# Patient Record
Sex: Male | Born: 1935
Health system: Southern US, Community
[De-identification: ages and names within clinical notes are randomized; demographics above are authoritative.]

## PROBLEM LIST (undated history)

## (undated) DIAGNOSIS — D369 Benign neoplasm, unspecified site: Secondary | ICD-10-CM

## (undated) DIAGNOSIS — Z860100 Personal history of colon polyps, unspecified: Secondary | ICD-10-CM

## (undated) DIAGNOSIS — I2510 Atherosclerotic heart disease of native coronary artery without angina pectoris: Secondary | ICD-10-CM

## (undated) DIAGNOSIS — I350 Nonrheumatic aortic (valve) stenosis: Secondary | ICD-10-CM

## (undated) DIAGNOSIS — Z8546 Personal history of malignant neoplasm of prostate: Secondary | ICD-10-CM

## (undated) DIAGNOSIS — M48061 Spinal stenosis, lumbar region without neurogenic claudication: Secondary | ICD-10-CM

## (undated) DIAGNOSIS — I10 Essential (primary) hypertension: Secondary | ICD-10-CM

## (undated) DIAGNOSIS — E785 Hyperlipidemia, unspecified: Secondary | ICD-10-CM

## (undated) DIAGNOSIS — K649 Unspecified hemorrhoids: Secondary | ICD-10-CM

## (undated) DIAGNOSIS — C4490 Unspecified malignant neoplasm of skin, unspecified: Secondary | ICD-10-CM

## (undated) DIAGNOSIS — K5790 Diverticulosis of intestine, part unspecified, without perforation or abscess without bleeding: Secondary | ICD-10-CM

## (undated) HISTORY — DX: Personal history of malignant neoplasm of prostate: Z85.46

## (undated) HISTORY — PX: APPENDECTOMY: SHX54

## (undated) HISTORY — DX: Benign neoplasm, unspecified site: D36.9

## (undated) HISTORY — DX: Personal history of colonic polyps: Z86.010

## (undated) HISTORY — DX: Essential (primary) hypertension: I10

## (undated) HISTORY — DX: Unspecified malignant neoplasm of skin, unspecified: C44.90

## (undated) HISTORY — DX: Unspecified hemorrhoids: K64.9

## (undated) HISTORY — PX: CARDIAC CATHETERIZATION: SHX172

## (undated) HISTORY — PX: MOHS SURGERY: SUR867

## (undated) HISTORY — DX: Nonrheumatic aortic (valve) stenosis: I35.0

## (undated) HISTORY — PX: CORONARY STENT PLACEMENT: SHX1402

## (undated) HISTORY — PX: PROSTATECTOMY: SHX69

## (undated) HISTORY — DX: Diverticulosis of intestine, part unspecified, without perforation or abscess without bleeding: K57.90

## (undated) HISTORY — DX: Spinal stenosis, lumbar region without neurogenic claudication: M48.061

## (undated) HISTORY — DX: Atherosclerotic heart disease of native coronary artery without angina pectoris: I25.10

## (undated) HISTORY — DX: Hyperlipidemia, unspecified: E78.5

## (undated) HISTORY — DX: Personal history of colon polyps, unspecified: Z86.0100

---

## 2000-11-02 ENCOUNTER — Encounter (INDEPENDENT_AMBULATORY_CARE_PROVIDER_SITE_OTHER): Payer: Self-pay | Admitting: Specialist

## 2000-11-02 ENCOUNTER — Other Ambulatory Visit: Admission: RE | Admit: 2000-11-02 | Discharge: 2000-11-02 | Payer: Self-pay | Admitting: Urology

## 2000-11-09 ENCOUNTER — Encounter: Admission: RE | Admit: 2000-11-09 | Discharge: 2001-02-07 | Payer: Self-pay | Admitting: Radiation Oncology

## 2000-12-15 ENCOUNTER — Encounter (INDEPENDENT_AMBULATORY_CARE_PROVIDER_SITE_OTHER): Payer: Self-pay | Admitting: Specialist

## 2000-12-15 ENCOUNTER — Inpatient Hospital Stay (HOSPITAL_COMMUNITY): Admission: RE | Admit: 2000-12-15 | Discharge: 2000-12-21 | Payer: Self-pay | Admitting: Urology

## 2000-12-17 ENCOUNTER — Encounter: Payer: Self-pay | Admitting: Urology

## 2000-12-17 ENCOUNTER — Encounter (INDEPENDENT_AMBULATORY_CARE_PROVIDER_SITE_OTHER): Payer: Self-pay | Admitting: *Deleted

## 2000-12-19 ENCOUNTER — Encounter: Payer: Self-pay | Admitting: Urology

## 2000-12-19 ENCOUNTER — Encounter (INDEPENDENT_AMBULATORY_CARE_PROVIDER_SITE_OTHER): Payer: Self-pay | Admitting: *Deleted

## 2005-06-23 ENCOUNTER — Ambulatory Visit: Payer: Self-pay

## 2005-07-03 ENCOUNTER — Ambulatory Visit: Payer: Self-pay | Admitting: Cardiology

## 2005-07-06 ENCOUNTER — Inpatient Hospital Stay (HOSPITAL_COMMUNITY): Admission: AD | Admit: 2005-07-06 | Discharge: 2005-07-07 | Payer: Self-pay | Admitting: Cardiology

## 2005-07-06 ENCOUNTER — Inpatient Hospital Stay (HOSPITAL_BASED_OUTPATIENT_CLINIC_OR_DEPARTMENT_OTHER): Admission: RE | Admit: 2005-07-06 | Discharge: 2005-07-06 | Payer: Self-pay | Admitting: Cardiology

## 2005-07-06 ENCOUNTER — Ambulatory Visit: Payer: Self-pay | Admitting: Cardiology

## 2005-07-14 ENCOUNTER — Ambulatory Visit: Payer: Self-pay | Admitting: Cardiology

## 2005-07-28 ENCOUNTER — Ambulatory Visit: Payer: Self-pay | Admitting: Cardiology

## 2005-08-03 ENCOUNTER — Ambulatory Visit: Payer: Self-pay

## 2005-08-03 ENCOUNTER — Ambulatory Visit: Payer: Self-pay | Admitting: Cardiology

## 2005-11-12 ENCOUNTER — Ambulatory Visit: Payer: Self-pay | Admitting: Cardiology

## 2005-11-17 ENCOUNTER — Ambulatory Visit: Payer: Self-pay | Admitting: Cardiology

## 2006-02-16 ENCOUNTER — Ambulatory Visit: Payer: Self-pay

## 2006-03-29 ENCOUNTER — Ambulatory Visit: Payer: Self-pay | Admitting: Cardiology

## 2007-04-07 ENCOUNTER — Ambulatory Visit: Payer: Self-pay | Admitting: Cardiology

## 2008-04-24 ENCOUNTER — Ambulatory Visit: Payer: Self-pay | Admitting: Cardiology

## 2009-05-19 DIAGNOSIS — E785 Hyperlipidemia, unspecified: Secondary | ICD-10-CM | POA: Insufficient documentation

## 2009-05-19 DIAGNOSIS — I2510 Atherosclerotic heart disease of native coronary artery without angina pectoris: Secondary | ICD-10-CM | POA: Insufficient documentation

## 2009-05-19 DIAGNOSIS — I10 Essential (primary) hypertension: Secondary | ICD-10-CM | POA: Insufficient documentation

## 2009-05-20 ENCOUNTER — Ambulatory Visit: Payer: Self-pay | Admitting: Cardiology

## 2009-05-20 ENCOUNTER — Telehealth: Payer: Self-pay | Admitting: Cardiology

## 2009-07-16 ENCOUNTER — Telehealth: Payer: Self-pay | Admitting: Cardiology

## 2009-12-05 ENCOUNTER — Encounter (INDEPENDENT_AMBULATORY_CARE_PROVIDER_SITE_OTHER): Payer: Self-pay | Admitting: *Deleted

## 2010-08-05 ENCOUNTER — Encounter: Payer: Self-pay | Admitting: Internal Medicine

## 2010-08-12 ENCOUNTER — Encounter: Payer: Self-pay | Admitting: Internal Medicine

## 2010-08-20 ENCOUNTER — Encounter: Payer: Self-pay | Admitting: Internal Medicine

## 2010-08-21 ENCOUNTER — Telehealth: Payer: Self-pay | Admitting: Cardiology

## 2010-08-22 ENCOUNTER — Encounter: Payer: Self-pay | Admitting: Internal Medicine

## 2010-09-11 ENCOUNTER — Encounter: Payer: Self-pay | Admitting: Cardiovascular Disease

## 2010-09-11 ENCOUNTER — Ambulatory Visit
Admission: RE | Admit: 2010-09-11 | Discharge: 2010-09-11 | Payer: Self-pay | Source: Home / Self Care | Attending: Cardiovascular Disease | Admitting: Cardiovascular Disease

## 2010-09-23 NOTE — Letter (Signed)
Summary: Referral - not able to see patient  [facility]  [address] [place], [place] [address]   Phone: [phone]  Fax: [phone]        December 05, 2009    [facility] A. Jacky Kindle, M.D. [address] [place], [place] [address]    Re:   [name] [name] DOB:  03-09-1932 MRN:   [id number]    Dear Dr. Jacky Kindle:  Thank you for your kind referral of the above patient.  We have attempted to schedule the recommended procedure Screening Colonoscopy but have not been able to schedule because:   X  The patient was not available by phone and/or has not returned our calls.  ___ The patient declined to schedule the procedure at this time.  We appreciate the referral and hope that we will have the opportunity to treat this patient in the future.    Sincerely,    [facility] [phone]

## 2010-09-25 NOTE — Assessment & Plan Note (Signed)
Summary: pt of brodie/mt   Primary Provider:  DR Donney Dice  CC:  check up.  History of Present Illness: Mr. Cartlidge is 77 years old and returns for management of CAD.  10 2006 he had a bare-metal stent to the RCA and had residual 50-70% narrowing in the LAD. He has good LV function.  He has done quite well over the past year with no chest pain joints breath or palpitations. He works at the [employer] and is outside in hot weather much of the time and has had no symptoms related to this. He walks 2 miles 3 days a week.  I recently cared for his wife who had and MI after knee surgery requiring extensive hospital stay and rehab.  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 1 Tab By Mouth Once Daily 2)  Metoprolol Succinate 25 Mg Xr24h-Tab (Metoprolol Succinate) .... Take One Tablet By Mouth Daily 3)  Aspirin 81 Mg Tbec (Aspirin) .... Take One Tablet By Mouth Daily 4)  Nitrostat 0.4 Mg Subl (Nitroglycerin) .... As Directed 5)  Lotrel 5-20 Mg Caps (Amlodipine Besy-Benazepril Hcl) .Marland Kitchen.. 1 Tab By Mouth Once Daily 6)  Triamterene-Hctz 75-50 Mg Tabs (Triamterene-Hctz) .Marland Kitchen.. 1 Tab By Mouth Once Daily 7)  Omega 3 .Marland Kitchen.. 1 Tab Once Daily 8)  Plavix 75 Mg Tabs (Clopidogrel Bisulfate) .... Take One Tablet By Mouth Daily  Allergies (verified): No Known Drug Allergies  Past History:  Past Medical History: Last updated: 05/19/2009 1. Coronary artery disease status post stenting of the right coronary     artery in November 2006 with a bare-metal stent with residual 50-     70% narrowing in the left anterior descending coronary artery. 2. Good left ventricular function. 3. Hypertension. 4. Hyperlipidemia. 5. History of prostatectomy.    Family History: Last updated: 09/11/2010 no premature CAD  Social History: Last updated: 09/11/2010 Worked at [employer] Non smoker Non drinker Active works out 4-5 x/week  Family History: no premature CAD  Social History: Worked at [employer] Non smoker Non drinker Active works out 4-5 x/week  Review of Systems       Denies fever, malais, weight loss, blurry vision, decreased visual acuity, cough, sputum, SOB, hemoptysis, pleuritic pain, palpitaitons, heartburn, abdominal pain, melena, lower extremity edema, claudication, or rash.   Vital Signs:  Patient profile:   77 year old male Height:      70 inches Weight:      208 pounds BMI:     29.95 Pulse rate:   59 / minute Resp:     14 per minute BP sitting:   145 / 74  (left arm)  Vitals Entered By: Kem Parkinson (September 11, 2010 3:48 PM)  Physical Exam  General:  Affect appropriate Healthy:  appears stated age HEENT: normal Neck supple with no adenopathy JVP normal no bruits no thyromegaly Lungs clear with no wheezing and good diaphragmatic motion Heart:  S1/S2 no murmur,rub, gallop or click PMI normal Abdomen: benighn, BS positve, no tenderness, no AAA no bruit.  No HSM or HJR Distal pulses intact with no bruits No edema Neuro non-focal Skin warm and dry    Impression & Recommendations:  Problem # 1:  HYPERLIPIDEMIA-MIXED (ICD-272.4) Continue vytorin labs per primary His  updated medication list for this problem includes:    Vytorin 10-20 Mg Tabs (Ezetimibe-simvastatin) .Marland Kitchen... 1 tab by mouth once daily  Problem # 2:  HYPERTENSION, BENIGN (ICD-401.1) Well controlled His updated medication list for this problem includes:    Metoprolol Succinate 25 Mg Xr24h-tab (Metoprolol succinate) .Marland Kitchen... Take one tablet by mouth daily    Aspirin 81 Mg Tbec (Aspirin) .Marland Kitchen... Take one tablet by mouth daily    Lotrel 5-20 Mg Caps (Amlodipine besy-benazepril hcl) .Marland Kitchen... 1 tab by mouth once daily     Triamterene-hctz 75-50 Mg Tabs (Triamterene-hctz) .Marland Kitchen... 1 tab by mouth once daily  Problem # 3:  CAD, NATIVE VESSEL (ICD-414.01) Stable no angina Would continue Plavix since there is no down side to doing this His updated medication list for this problem includes:    Metoprolol Succinate 25 Mg Xr24h-tab (Metoprolol succinate) .Marland Kitchen... Take one tablet by mouth daily    Aspirin 81 Mg Tbec (Aspirin) .Marland Kitchen... Take one tablet by mouth daily    Nitrostat 0.4 Mg Subl (Nitroglycerin) .Marland Kitchen... As directed    Lotrel 5-20 Mg Caps (Amlodipine besy-benazepril hcl) .Marland Kitchen... 1 tab by mouth once daily    Plavix 75 Mg Tabs (Clopidogrel bisulfate) .Marland Kitchen... Take one tablet by mouth daily  Patient Instructions: 1)  Your physician wants you to follow-up in: ONE YEAR  You will receive a reminder letter in the mail two months in advance. If you don't receive a letter, please call our office to schedule the follow-up appointment.

## 2010-09-25 NOTE — Progress Notes (Signed)
Summary: question on his new dr   Phone Note Call from Patient Call back at Home Phone [phone]   Caller: Patient Reason for Call: Talk to Nurse Summary of Call: pt wants to know what dr does dr Juanda Chance wants him to see Initial call taken by: Roe Coombs,  August 21, 2010 3:47 PM  Follow-up for Phone Call        Flag sent to Dr. Juanda Chance to see who he would recommend. Sherri Rad, RN, BSN  August 21, 2010 4:19 PM   Per Dr. Juanda Chance, the pt should f/u with Dr. Clifton James. I will notify the pt. Sherri Rad, RN, BSN  August 26, 2010 9:21 AM   The pt is aware of Dr. Regino Schultze recommendation. He did not want to schedule a f/u appt at this time, but stated he would call back. Follow-up by: Sherri Rad, RN, BSN,  August 26, 2010 9:23 AM

## 2010-09-25 NOTE — Letter (Signed)
Summary: New Patient letter  [facility]  [address] [place], [place] [address]   Phone: [phone]  Fax: [phone]    ;   08/22/2010 MRN: [id number]  [name] [name] [address] [place], [place]  [address]  Dear Mr. TRAWEEK,  Welcome to the Gastroenterology Division at [facility].    You are scheduled to see Dr.  Lina Sar on 10-15-10 at  9:15am on the 3rd floor at [facility], [address]  We ask that you try to arrive at our office 15 minutes prior to your appointment time to allow for check-in.  We would like you to complete the enclosed self-administered evaluation form prior to your visit and bring it with you on the day of your appointment.  We will review it with you.  Also, please bring a complete list of all your medications or, if you prefer, bring the medication bottles and we will list them.  Please bring your insurance card so that we may make a copy of it.  If your insurance requires a referral to see a specialist, please bring your referral form from your primary care physician.  Co-payments are due at the time of your visit and may be paid by cash, check or credit card.     Your office visit will consist of a consult with your physician (includes a physical exam), any laboratory testing he/she may order, scheduling of any necessary diagnostic testing (e.g. x-ray, ultrasound, CT-scan), and scheduling of a procedure (e.g. Endoscopy, Colonoscopy) if required.  Please allow enough time on your schedule to allow for any/all of these possibilities.    If you cannot keep your appointment, please call [phone] to cancel or reschedule prior to your appointment date.  This allows [place] the opportunity to schedule an appointment for another patient in need of care.  If you do not cancel or reschedule by 5 p.m. the business day prior to your appointment date, you will be charged a $50.00 late cancellation/no-show fee.    Thank you for choosing  [facility] for your medical needs.  We appreciate the opportunity to care for you.  Please visit [place] at our website  to learn more about our practice.                     Sincerely,                                                             The Gastroenterology Division

## 2010-10-16 ENCOUNTER — Telehealth: Payer: Self-pay | Admitting: Cardiovascular Disease

## 2010-10-16 DIAGNOSIS — Z8546 Personal history of malignant neoplasm of prostate: Secondary | ICD-10-CM | POA: Insufficient documentation

## 2010-10-16 DIAGNOSIS — K921 Melena: Secondary | ICD-10-CM | POA: Insufficient documentation

## 2010-10-20 ENCOUNTER — Encounter: Payer: Self-pay | Admitting: Internal Medicine

## 2010-10-20 ENCOUNTER — Ambulatory Visit (INDEPENDENT_AMBULATORY_CARE_PROVIDER_SITE_OTHER): Payer: Medicare Other | Admitting: Internal Medicine

## 2010-10-20 DIAGNOSIS — Z1211 Encounter for screening for malignant neoplasm of colon: Secondary | ICD-10-CM

## 2010-10-20 DIAGNOSIS — R195 Other fecal abnormalities: Secondary | ICD-10-CM

## 2010-10-21 NOTE — Letter (Signed)
Summary: [facility] Office Note  [facility] Office Note   Imported By: Lamona Curl CMA (AAMA) 10/16/2010 14:05:36  _____________________________________________________________________  External Attachment:    Type:   Image     Comment:   External Document

## 2010-10-21 NOTE — Letter (Signed)
Summary: [facility]   Imported By: Lamona Curl CMA (AAMA) 10/16/2010 14:14:54  _____________________________________________________________________  External Attachment:    Type:   Image     Comment:   External Document

## 2010-10-21 NOTE — Letter (Signed)
Summary: [facility]  [facility]   Imported By: Lamona Curl CMA (AAMA) 10/16/2010 14:06:55  _____________________________________________________________________  External Attachment:    Type:   Image     Comment:   External Document

## 2010-10-21 NOTE — Letter (Signed)
Summary: [facility]  [facility]   Imported By: Lamona Curl CMA (AAMA) 10/16/2010 14:15:23  _____________________________________________________________________  External Attachment:    Type:   Image     Comment:   External Document

## 2010-10-23 ENCOUNTER — Other Ambulatory Visit: Payer: Self-pay | Admitting: Internal Medicine

## 2010-10-23 ENCOUNTER — Other Ambulatory Visit (AMBULATORY_SURGERY_CENTER): Payer: Medicare Other | Admitting: Internal Medicine

## 2010-10-23 DIAGNOSIS — D126 Benign neoplasm of colon, unspecified: Secondary | ICD-10-CM

## 2010-10-23 DIAGNOSIS — K5730 Diverticulosis of large intestine without perforation or abscess without bleeding: Secondary | ICD-10-CM

## 2010-10-23 DIAGNOSIS — Z1211 Encounter for screening for malignant neoplasm of colon: Secondary | ICD-10-CM

## 2010-10-23 DIAGNOSIS — K625 Hemorrhage of anus and rectum: Secondary | ICD-10-CM

## 2010-10-24 ENCOUNTER — Telehealth: Payer: Self-pay | Admitting: Internal Medicine

## 2010-10-28 ENCOUNTER — Encounter: Payer: Self-pay | Admitting: Internal Medicine

## 2010-10-30 NOTE — Assessment & Plan Note (Signed)
Summary: SCREEN FOR COLON ON PLAVIX//BLOOD IN STOOL/YF   NO GI HX PER ...   History of Present Illness Visit Type: Initial Consult Primary GI MD: Lina Sar MD Primary Provider: Minda Meo, MD  Requesting Provider: Minda Meo, MD  Chief Complaint: Consult colon. Pt c/o hemorrhoids and some rectal bleeding  History of Present Illness:   This is a 77 year old white male who is here to discuss having a colonoscopy. He has never had a colonoscopy before although his record mentions colon in 2004.  He is on Plavix 75 mg daily for coronary artery disease. He is status post bare-metal stent placement in RCA and has good left ventricular function. Patient has been followed formely by Dr Charlies Constable and currently by Dr Eden Emms. He was found to have Hemoccult-positive stool on a home test. He has regular bowel habits. He denies having any visible blood per rectum but has complained of occasional hemorrhoidal problems. 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   GI Review of Systems      Denies abdominal pain, acid reflux, belching, bloating, chest pain, dysphagia with liquids, dysphagia with solids, heartburn, loss of appetite, nausea, vomiting, vomiting blood, weight loss, and  weight gain.      Reports hemorrhoids and  rectal bleeding.     Denies anal fissure, black tarry stools, change in bowel habit, constipation, diarrhea, diverticulosis, fecal incontinence, heme positive stool, irritable bowel syndrome, jaundice, light color stool, liver problems, and  rectal pain.    Current Medications (verified): 1)  Vytorin 10-20 Mg Tabs (Ezetimibe-Simvastatin) .Marland Kitchen.. 1 Tab By Mouth Once Daily 2)  Metoprolol Succinate 25 Mg Xr24h-Tab (Metoprolol Succinate) .... Take One Tablet By Mouth Daily 3)  Aspirin 81 Mg Tbec (Aspirin) .... Take One Tablet By Mouth Daily 4)  Nitrostat 0.4 Mg Subl (Nitroglycerin) .... As Directed 5)  Lotrel 5-20 Mg Caps (Amlodipine Besy-Benazepril Hcl) .Marland Kitchen.. 1 Tab By Mouth Once Daily 6)   Triamterene-Hctz 75-50 Mg Tabs (Triamterene-Hctz) .Marland Kitchen.. 1 Tab By Mouth Once Daily 7)  Omega 3 .Marland Kitchen.. 1 Tab Once Daily 8)  Plavix 75 Mg Tabs (Clopidogrel Bisulfate) .... Take One Tablet By Mouth Daily  Allergies (verified): No Known Drug Allergies  Past History:  Past Medical History: 1. Coronary artery disease status post stenting of the right coronary     artery in November 2006 with a bare-metal stent with residual 50-     70% narrowing in the left anterior descending coronary artery. 2. Good left ventricular function. 3. Hypertension. 4. Hyperlipidemia. 5. History of prostatectomy.  Hemorrhoids  Past Surgical History: Prostatectomy Appendectomy Stent Surgery   Family History: Reviewed history from 10/16/2010 and no changes required. no premature CAD Family History of Heart Disease: Mother No FH of Colon Cancer:  Social History: Worked at [employer]--Retired Married Childern Non smoker Non drinker Active works out 4-5 x/week  Review of Systems       The patient complains of urine leakage.  The patient denies allergy/sinus, anemia, anxiety-new, arthritis/joint pain, back pain, blood in urine, breast changes/lumps, change in vision, confusion, cough, coughing up blood, depression-new, fainting, fatigue, fever, headaches-new, hearing problems, heart murmur, heart rhythm changes, itching, menstrual pain, muscle pains/cramps, night sweats, nosebleeds, pregnancy symptoms, shortness of breath, skin rash, sleeping problems, sore throat, swelling of feet/legs, swollen lymph glands, thirst - excessive , urination - excessive , urination changes/pain, vision changes, and voice change.         Pertinent positive and negative review of systems were noted in the  above HPI. All other ROS was otherwise negative.   Vital Signs:  Patient profile:   77 year old male Height:      70 inches Weight:      209 pounds BMI:     30.10 BSA:     2.13 Pulse rate:   60 / minute Pulse rhythm:    regular BP sitting:   124 / 68  (left arm) Cuff size:   regular  Vitals Entered By: Ok Anis CMA (October 20, 2010 9:06 AM)  Physical Exam  General:  Well developed, well nourished, no acute distress. Eyes:  PERRLA, no icterus. Mouth:  No deformity or lesions, dentition normal. Neck:  Supple; no masses or thyromegaly. Lungs:  Clear throughout to auscultation. Heart:  Regular rate and rhythm; no murmurs, rubs,  or bruits. Abdomen:  Soft, nontender and nondistended. No masses, hepatosplenomegaly or hernias noted. Normal bowel sounds,well-healed surgical scar from prior prostatectomy. Rectal:  normal perianal area. Normal rectal sphincter tone. Hemoccult-Positive yellow stool. Extremities:  No clubbing, cyanosis, edema or deformities noted.   Impression & Recommendations:  Problem # 1:  BLOOD IN STOOL (ICD-578.1)  Patient had heme positive stool on a home screening test as well as on my exam today. He is an appropriate candidate for screening colonoscopy which will be scheduled at his earliest convenience. His hemoglobin is 13.9.  Orders: Colonoscopy (Colon)  Problem # 2:  CAD, NATIVE VESSEL (ICD-414.01) Patient has been on aspirin and Plavix. We will continue her on both medications during colonoscopy to minimize the risk of stent occlusion and the patient agrees with the plan.  Patient Instructions: 1)  Remain on Plavix and aspirin for colonoscopy 2)  We have scheduled you for a colonoscopy with Moviprep. 3)  Further recommendation depending on the results of the colonoscopy. 4)  Copy sent to : Dr Jacky Kindle 5)  The medication list was reviewed and reconciled.  All changed / newly prescribed medications were explained.  A complete medication list was provided to the patient / caregiver. Prescriptions: MOVIPREP 100 GM  SOLR (PEG-KCL-NACL-NASULF-NA ASC-C) As per prep instructions.  #1 x 0   Entered by:   Lamona Curl CMA (AAMA)   Authorized by:   Hart Carwin MD    Signed by:   Lamona Curl CMA (AAMA) on 10/20/2010   Method used:   Electronically to        [facility]* (retail)       [address]       [place], [place]  [id number]       Ph: [phone]       Fax: [phone]   RxID:   [id number]

## 2010-10-30 NOTE — Progress Notes (Signed)
Summary: Triage  Phone Note Call from Patient Call back at Home Phone [phone]   Caller: Patient Call For: Dr. Juanda Chance Reason for Call: Talk to Nurse Summary of Call: Wants to know what his BP was yesterday Initial call taken by: Karna Christmas,  October 24, 2010 3:27 PM  Follow-up for Phone Call        request reading of several blood pressure while here for colonoscopy.  States he likes to keep a record.  Gave hime several of his blood pressure reading and he seemed satisfied. Follow-up by: Laverna Peace RN,  October 24, 2010 4:31 PM

## 2010-10-30 NOTE — Procedures (Addendum)
Summary: Colonoscopy  Patient: [name] Note: All result statuses are Final unless otherwise noted.  Tests: (1) Colonoscopy (COL)   COL Colonoscopy           DONE     [facility]     [address]     [place], [place]  [address]          COLONOSCOPY PROCEDURE REPORT          PATIENT:  [name] [name], [name] [name]  MR#:  [id number]     BIRTHDATE:  [birth year], 78 yrs. old  GENDER:  male     ENDOSCOPIST:  Hedwig Morton. Juanda Chance, MD     REF. BY:  Geoffry Paradise, M.D.     PROCEDURE DATE:  10/23/2010     PROCEDURE:  Colonoscopy 21308     ASA CLASS:  Class III     INDICATIONS:  Routine Risk Screening     MEDICATIONS:   Versed 9 mg, Fentanyl 100 mcg          DESCRIPTION OF PROCEDURE:   After the risks benefits and     alternatives of the procedure were thoroughly explained, informed     consent was obtained.  Digital rectal exam was performed and     revealed no rectal masses.   The LB CF-[id number] endoscope     was introduced through the anus and advanced to the cecum, which     was identified by both the appendix and ileocecal valve, without     limitations.  The quality of the prep was good, using MoviPrep.     The instrument was then slowly withdrawn as the colon was fully     examined.     <<PROCEDUREIMAGES>>          FINDINGS:  There were multiple polyps identified and removed. 6     polyps 25 - 90 cm, 15 mm and 12mm 2 large pedunculated polyps at     30cm, and 4 small sessile polyps 4-5 mm in the descending colon     The polyps were removed using cold biopsy forceps. Polyps were     snared without cautery. Retrieval was successful (see image4,     image5, image6, image7, image8, image10, image11, image12,     image13, image14, and image15). snare polyp  Moderate     diverticulosis was found in the sigmoid colon (see image9).     difficult to advance through narrow tortuous sigmoid colon  This     was otherwise a normal examination of the colon (see image3).     Retroflexed  views in the rectum revealed it was not tolerated by the     patient.    The scope was then withdrawn from the patient and the     procedure completed.          COMPLICATIONS:  None     ENDOSCOPIC IMPRESSION:     1) Polyps, multiple     2) Moderate diverticulosis in the sigmoid colon     3) Otherwise normal examination     4) It was not tolerated by the patient.     large pedunculated polyp injected with Epi prior to the removal          6 polyps removed     RECOMMENDATIONS:     1) Await pathology results     hold Plavix x 1 week, then resume     future colon ought to be done off Plavix  REPEAT EXAM:  In 3 year(s) for.          ______________________________     Hedwig Morton. Juanda Chance, MD          CC:          n.     eSIGNED:   Hedwig Morton. Juana Montini at 10/23/2010 02:29 PM          Lana Fish, [id number]  Note: An exclamation mark (!) indicates a result that was not dispersed into the flowsheet. Document Creation Date: 10/23/2010 2:29 PM _______________________________________________________________________  (1) Order result status: Final Collection or observation date-time: 10/23/2010 14:13 Requested date-time:  Receipt date-time:  Reported date-time:  Referring Physician:   Ordering Physician: Lina Sar [id number]) Specimen Source:  Source: Launa Grill Order Number: [id number] Lab site:   Appended Document: Colonoscopy     Procedures Next Due Date:    Colonoscopy: 10/2012

## 2010-10-30 NOTE — Progress Notes (Signed)
Summary: question re procedure  Phone Note Call from Patient Call back at Home Phone [phone]   Caller: Patient Reason for Call: Talk to Nurse Summary of Call: pt having a colon procedure w. dr Juanda Chance, dora on monday. pt wants to know if its okay for him to have it done.  Initial call taken by: Roe Coombs,  October 16, 2010 4:50 PM  Follow-up for Phone Call        spoke with pt, he is having colonoscopy monday and wondered if okay because of plavix. will discuss with dr Verdis Prime, RN  October 16, 2010 5:45 PM  left message for pt okay for colonoscopy on plavix Deliah Goody, RN  October 20, 2010 11:19 AM

## 2010-10-30 NOTE — Letter (Signed)
Summary: [facility] Instructions  [facility]  [address] [place], [place] [address]   Phone: [phone]  Fax: [phone]       [name] [name]    [birth year]    MRN: [id number]        Procedure Day /Date: Thursday 10/23/10     Arrival Time: 12:30 pm     Procedure Time: 1:30 pm        Location of Procedure:                    _x _  [facility] (4th Floor)  PREPARATION FOR COLONOSCOPY WITH MOVIPREP   Starting 5 days prior to your procedure (TODAY) do not eat nuts, seeds, popcorn, corn, beans, peas,  salads, or any raw vegetables.  Do not take any fiber supplements (e.g. Metamucil, Citrucel, and Benefiber).  THE DAY BEFORE YOUR PROCEDURE         DATE: 10/22/10  DAY: Wednesday  1.  Drink clear liquids the entire day-NO SOLID FOOD  2.  Do not drink anything colored red or purple.  Avoid juices with pulp.   No orange juice.  3.  Drink at least 64 oz. (8 glasses) of fluid/clear liquids during the day to prevent dehydration and help the prep work efficiently.  CLEAR LIQUIDS INCLUDE: Water Jello Ice Popsicles Tea (sugar ok, no milk/cream) Powdered fruit flavored drinks Coffee (sugar ok, no milk/cream) Gatorade Juice: apple, white grape, white cranberry  Lemonade Clear bullion, consomm, broth Carbonated beverages (any kind) Strained chicken noodle soup Hard Candy                             4.  In the morning, mix first dose of MoviPrep solution:    Empty 1 Pouch A and 1 Pouch B into the disposable container    Add lukewarm drinking water to the top line of the container. Mix to dissolve    Refrigerate (mixed solution should be used within 24 hrs)  5.  Begin drinking the prep at 5:00 p.m. The MoviPrep container is divided by 4 marks.   Every 15 minutes drink the solution down to the next mark (approximately 8 oz) until the full liter is complete.   6.  Follow completed prep with 16 oz of clear liquid of your choice (Nothing red or purple).   Continue to drink clear liquids until bedtime.  7.  Before going to bed, mix second dose of MoviPrep solution:    Empty 1 Pouch A and 1 Pouch B into the disposable container    Add lukewarm drinking water to the top line of the container. Mix to dissolve    Refrigerate  THE DAY OF YOUR PROCEDURE      DATE: 10/23/10 DAY: Thursday  Beginning at 8:30 a.m. (5 hours before procedure):         1. Every 15 minutes, drink the solution down to the next mark (approx 8 oz) until the full liter is complete.  2. Follow completed prep with 16 oz. of clear liquid of your choice.    3. You may drink clear liquids until 11:30 am (2 HOURS BEFORE PROCEDURE).   MEDICATION INSTRUCTIONS  Unless otherwise instructed, you should take regular prescription medications with a small sip of water   as early as possible the morning of your procedure.  Continue Plavix Per Dr Juanda Chance       OTHER INSTRUCTIONS  You  will need a responsible adult at least 75 years of age to accompany you and drive you home.   This person must remain in the waiting room during your procedure.  Wear loose fitting clothing that is easily removed.  Leave jewelry and other valuables at home.  However, you may wish to bring a book to read or  an iPod/MP3 player to listen to music as you wait for your procedure to start.  Remove all body piercing jewelry and leave at home.  Total time from sign-in until discharge is approximately 2-3 hours.  You should go home directly after your procedure and rest.  You can resume normal activities the  day after your procedure.  The day of your procedure you should not:   Drive   Make legal decisions   Operate machinery   Drink alcohol   Return to work  You will receive specific instructions about eating, activities and medications before you leave.    The above instructions have been reviewed and explained to me by   _______________________    I fully understand and can  verbalize these instructions _____________________________ Date _________

## 2010-11-04 NOTE — Letter (Signed)
Summary: Patient Notice- Polyp Results  [facility]  [address] [place], [place] [address]   Phone: [phone]  Fax: [phone]        October 28, 2010 MRN: [id number]    [name] [name] [address] [place], [place]  [address]    Dear [name],  I am pleased to inform you that the colon polyp(s) removed during your recent colonoscopy was (were) found to be benign (no cancer detected) upon pathologic examination.All polyps were adenomatous ( premalignant). One polyp had a low grade dysplasia ( higher risk of cancer)  I recommend you have a repeat colonoscopy examination in _2 years to look for recurrent polyps, as having colon polyps increases your risk for having recurrent polyps or even colon cancer in the future. We would hold Your Plavix 1 week pror to Your colonoscopy  Should you develop new or worsening symptoms of abdominal pain, bowel habit changes or bleeding from the rectum or bowels, please schedule an evaluation with either your primary care physician or with me.  Additional information/recommendations:  _x_ No further action with gastroenterology is needed at this time. Please      follow-up with your primary care physician for your other healthcare      needs.  __ Please call [phone] to schedule a return visit to review your      situation.  __ Please keep your follow-up visit as already scheduled.  __ Continue treatment plan as outlined the day of your exam.  Please call us if you are having persistent problems or have questions about your condition that have not been fully answered at this time.  Sincerely,  Hart Carwin MD  This letter has been electronically signed by your physician.  Appended Document: Patient Notice- Polyp Results letter mailed

## 2011-01-06 NOTE — Assessment & Plan Note (Signed)
[facility]                            CARDIOLOGY OFFICE NOTE   NAME:[name] [name], [name] [name]                       MRN:          [id number]  DATE:04/24/2008                            DOB:          [birth year]    PRIMARY CARE PHYSICIAN:  Geoffry Paradise, MD   CLINICAL HISTORY:  Mr. Kueker is 77 years old and returned for a followup  management of his coronary heart disease.  He has worked at the [employer] in the past on a part-time basis.  He had an abnormal stress  test in 2006 and subsequently underwent placement of a bare-metal stent  in the right coronary artery for high-grade stenosis.  He had residual  50-70% narrowing in the LAD.   He states he has been doing quite well.  He has had no chest pain,  shortness of breath, or palpitations.  He has lost about 5 pounds.   PAST MEDICAL HISTORY:  Significant for hypertension, hyperlipidemia, and  previous prostatectomy.   CURRENT MEDICATIONS:  1. Triamterene/hydrochlorothiazide 25/50 one-half tablet daily.  2. Vytorin 10/20 daily.  3. Plavix.  4. Aspirin 81 mg daily.  5. Toprol 25 mg daily.  6. Lotrel.   PHYSICAL EXAMINATION:  VITAL SIGNS:  On examination, the blood pressure  is 142/70, pulse 56 and regular.  NECK:  There is no venous distension.  Carotid pulses are full without  bruits.  CHEST:  Clear.  HEART:  Rhythm is regular.  There are no murmurs or gallops.  ABDOMEN:  Soft with normal bowel sounds.  There is no  hepatosplenomegaly.  EXTREMITIES:  Peripheral pulses are full.  There is no peripheral edema.   SOCIAL HISTORY:  Mr. 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  He is an avid [employer].  He is also an avid [employer].   Electrocardiogram showed a left axis deviation and nonspecific ST-T  changes and LVH.   IMPRESSION:  1. Coronary artery disease status post stenting of the right coronary      artery in November 2006 with  a bare-metal stent with residual 50-      70% narrowing in the left anterior descending coronary artery.  2. Good left ventricular function.  3. Hypertension.  4. Hyperlipidemia.  5. History of prostatectomy.   RECOMMENDATIONS:  I think Mr. Garabedian is doing well.  He is continuing to  try and lose weight.  He indicated Dr. Jacky Kindle recently checked his  lipid profile and we will get that from him.  We will plan to see him  back in followup in a year.     Bruce Elvera Lennox Juanda Chance, MD, [facility]  Electronically Signed    BRB/MedQ  DD: 04/24/2008  DT: 04/25/2008  Job #: [id number]

## 2011-01-06 NOTE — Assessment & Plan Note (Signed)
 HEALTHCARE                            CARDIOLOGY OFFICE NOTE   NAME:[name], [name] [place]                       MRN:          [id number]  DATE:04/07/2007                            DOB:          01/05/1932    PRIMARY CARE PHYSICIAN:  Dr. Geoffry Paradise.   CLINICAL HISTORY:  Mr. Sky is 77 years old and is working at [employer]  auction 3 days a week.  In November 2006, he developed angina and had an  abnormal stress test and underwent stenting of the right coronary artery  with a bare metal stent.  He had done quite well since that time with no  recent symptoms of chest pain, shortness of breath, or palpitations.  He  has residual 50-70% narrowing in the LAD.   PAST MEDICAL HISTORY:  1. Hypertension.  2. Hyperlipidemia.  3. History of previous prostatectomy.   CURRENT MEDICATIONS:  Triamterine/hydrochlorothiazide, Vytorin, Plavix,  Toprol, Lotrel, and aspirin.   EXAMINATION:  Blood pressure 134/83, pulse 56 and regular.  There was no venous distention.  The carotid pulses were full without  bruits.  CHEST:  Clear without rales or rhonchi.  CARDIAC:  Rhythm was regular.  Heart sounds were normal, and there were  no murmurs or gallops.  ABDOMEN:  Soft with normal bowel sounds.  Here is no hepatosplenomegaly.  Peripheral  pulses were full and no evidence of edema.   An electrocardiogram showed an intraventricular conduction delay, a left  axis deviation, and nonspecific ST-T changes.   IMPRESSION:  1. Coronary artery disease status post stenting in the right coronary      artery in November 2006, with a bare metal stent with residual 50-      70% stenosis in the left anterior descending.  2. Ejection fraction 50%.  3. Hypertension.  4. Hyperlipidemia.  5. History of prostatectomy.   RECOMMENDATIONS:  I think Mr. Gruenberg is doing well.  Somehow he got lost  to followup with Dr. Jacky Kindle, and I recommended that he call and make an  appointment for a  followup annual  visit.  He has not had his cholesterol checked in awhile, and we will  wait and let him do that as part of the complete physical.  I will plan  to see him back for a cardiac followup in 1 year.     Bruce Elvera Lennox Juanda Chance, MD, [facility]  Electronically Signed    BRB/MedQ  DD: 04/07/2007  DT: 04/08/2007  Job #: [id number]

## 2011-01-09 NOTE — H&P (Signed)
[facility]  Patient:    [name] [name], [name] [name] Visit Number: [id number] MRN: [id number]          Service Type: SUR Location: 3E 0321 02 Attending Physician:  Lauree Chandler Admit Date:  12/15/2000                           History and Physical  HISTORY OF PRESENT ILLNESS:  This 77 year old white male was found to have a PSA of 4.7 in August of 2001 but it rose to 6.0 on September 24, 2000, and I saw him in consultation after that. His prostate felt benign and smooth. He underwent a needle biopsy of the prostate that showed Gleasons grade VI carcinoma in both lobes, but fortunately only in 5% of the tissue on each side. He was counseled about curative therapies and he opted for radical retropubic prostatectomy and he was cleared for surgery by Richard A. Jacky Kindle, M.D.  PAST MEDICAL HISTORY:  He is in generally good health. He does have well controlled hypertension.  PAST SURGICAL HISTORY:  Only previous surgery was an appendectomy.  CURRENT MEDICATIONS: 1. Lotensin 20 mg p.o.q.d. 2. Triamterene/hydrochlorothiazide p.o.q.d.  ALLERGIES:  No known drug allergies.  SOCIAL HISTORY:  Tobacco:  None.  Alcohol:  None.  FAMILY HISTORY:  Noncontributory.  REVIEW OF SYSTEMS:  The review of systems are noted in our health history form.  PHYSICAL EXAMINATION:  GENERAL:  He is alert and oriented. He is in in no acute distress.  SKIN:  Warm and dry.  NECK:  Supple.  LUNGS:  Chest is clear.  CARDIOVASCULAR:  Heart sounds regular.  ABDOMEN:  Soft and nontender.  GENITALIA:  External genitalia normal.  RECTAL:  Prostate feels benign and smooth.  IMPRESSION: 1. Prostatic carcinoma. 2. Hypertension.  PLAN:  Radical retropubic prostatectomy and bilateral pelvic lymph node dissection. Attending Physician:  Lauree Chandler DD:  12/15/00 TD:  12/15/00 Job: [id number]

## 2011-01-09 NOTE — Cardiovascular Report (Signed)
NAME:  [name] [name], [name] [name] NO.:  [id number]   MEDICAL RECORD NO.:  [id number]          PATIENT TYPE:  INP   LOCATION:  6522                         FACILITY:  [facility]   PHYSICIAN:  Charlies Constable, M.D. [facility] DATE OF BIRTH:  [birth year]   DATE OF PROCEDURE:  07/06/2005  DATE OF DISCHARGE:                              CARDIAC CATHETERIZATION   CLINICAL HISTORY:  [name] is 77 years old and 3 weeks ago had an episode  of jaw pain and shortness of breath while mowing the lawn.  He subsequently  had a Cardiolite scan which showed inferior scar and superimposed ischemia,  and he was scheduled for evaluation with angiography.  He was done earlier  this morning in the outpatient laboratory.  Because of the severity of the  lesion, we decided to bring him upstairs and perform percutaneous coronary  intervention today.   DESCRIPTION OF PROCEDURE:  The procedure was performed via the right femoral  artery using arterial sheath and 6-French JR4 guiding catheter with side  holes.  The patient was given Angiomax bolus and infusion.  He was enrolled  in CHAMPION trial and was randomized to Plavix versus Integralin.  He also  was given 4 chewable aspirin.  We navigated across the lesion with a PT2  light support wire without difficulty.  We pre-dilated with a 2.5 x 20 mm  maverick, performing 1 inflation up to 8 atmospheres for 30 seconds.  We  then deployed a 3.5 x 24 mm Liberte stent, deploying this with 1 inflation  of 16 atmospheres for 30 seconds.  We post dilated with a 4 x 20 mm Quantum  Maverick, performing 1 inflation up to 15 atmospheres for 30 seconds.  Final  diagnostic study was then performed through the guiding catheter.  The  patient tolerated the procedure well and left the laboratory in satisfactory  condition.   RESULTS:  Initially the stenosis in the proximal LAD was estimated at 95%,  and there was a tandem lesion which was about 50%.  The distal flow was TIMI-  2  flow.  Following stenting, the two stenoses improved to 0%, and the flow  improved to TIMI-3 flow.  There was residual 70% stenosis in the mid portion  of the posterior descending branch.   CONCLUSION:  Successful percutaneous coronary intervention of the lesion in  the proximal left anterior descending using a bare metal stent with  improvement in narrowing from 95% to 0% and improvement in flow from TIMI-2  to TIMI-3 flow.   DISPOSITION:  The patient was returned to room for further observation.  I  am concerned about the wall motion abnormality in this patient, the LAD and  the borderline LAD lesion.  I think we may want to repeat a Cardialite scan  using exercise several weeks after this revascularization to reassess the  LAD lesion.           ______________________________  Charlies Constable, M.D. [facility]     BB/MEDQ  D:  07/06/2005  T:  07/06/2005  Job:  [id number]   cc:   Geoffry Paradise,  M.D.  Fax: [phone]   Rollene Rotunda, M.D.  [address]  [address]  [place]  [place] [address]   Cardiopulmonary Lab

## 2011-01-09 NOTE — Assessment & Plan Note (Signed)
[facility]                              CARDIOLOGY OFFICE NOTE   NAME:[name] [name] [place]                       MRN:          [id number]  DATE:03/29/2006                            DOB:          29-Jul-1932    HISTORY OF PRESENT ILLNESS:  Mr. [name] [name] is 77 years old and had a prior  stenting of the right coronary artery in November 2006 with a bare metal  stent that has residual 50-70% narrowing of the LAD with normal LV function.  He has done quite well and has had no recent chest pain, shortness of breath  or palpitations.   PAST MEDICAL HISTORY:  1.  Hypertension.  2.  Hyperlipidemia.  3.  History of prostatectomy.   CURRENT MEDICATIONS:  1.  Triamterene/hydrochlorothiazide.  2.  Omega 3 fatty acids.  3.  Vytorin.  4.  Plavix.  5.  Toprol.  6.  Lotrel.  7.  Aspirin.   PHYSICAL EXAMINATION:  VITAL SIGNS:  Blood pressure 122/80, pulse 53 and  regular.  No venous distension.  NECK:  Carotids were full without bruits.  CHEST:  Clear.  CARDIAC:  Rhythm was regular.  No murmurs, rubs or gallops.  ABDOMEN:  Soft, normal bowel sounds.  No hepatosplenomegaly.  Peripheral  pulses were full with no peripheral edema.   STUDIES:  ECG was normal.   IMPRESSION:  1.  Coronary artery disease status post stenting of the right coronary      artery in November 2006 with a bare metal stent with a residual of 50-      70% narrowing in the left anterior descending.  2.  Good LV function.  3.  Hypertension.  4.  Hyperlipidemia.  5.  History of prostate surgery.   RECOMMENDATIONS:  I think [name] is doing well.  His risk factors are  under pretty good control, although, his weight is not ideal, and his HDL  was low on his last lipid panel at 31.  His total cholesterol was 123 and  his LDL was 68.  I talked to him about loosing some weight, and the ways he  could raise his HDL with increasing exercise and eliminating the bad  carbohydrates  in his diet.  He  will work on this, and we might consider adding Niaspan if  he does not show some improvement.  I plan to see him back in cardiac follow  up in a year.                               Bruce Elvera Lennox Juanda Chance, MD, [facility]    BRB/MedQ  DD:  03/29/2006  DT:  03/29/2006  Job #:  [id number]   cc:   Geoffry Paradise, MD

## 2011-01-09 NOTE — Cardiovascular Report (Signed)
NAME:  JASMIN, TRUMBULL NO.:  [id number]   MEDICAL RECORD NO.:  [id number]          PATIENT TYPE:  OIB   LOCATION:  1963                         FACILITY:  [facility]   PHYSICIAN:  Charlies Constable, M.D. [facility] DATE OF BIRTH:  [birth year]   DATE OF PROCEDURE:  07/06/2005  DATE OF DISCHARGE:                              CARDIAC CATHETERIZATION   CLINICAL HISTORY:  Mr. 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  He had an episode of neck pain  while he was mowing the lawn about 3 weeks ago and had a Cardiolite scan  done in our office which showed inferior scar with some ischemia.  He was  seen in consultation by Dr. Antoine Poche and scheduled today for evaluation with  angiography.   DESCRIPTION OF PROCEDURE:  The procedure was performed by the right femoral  artery using arterial sheath and 6-French preformed coronary catheters.  A  frontal arterial puncture was performed, and Omnipaque contrast was used.  The patient tolerated the procedure well and left the laboratory in  satisfactory condition.   RESULTS:  The aortic pressure was 180/82 with a mean of 120.  Left  ventricular pressure was 180/23.   THE LEFT MAIN CORONARY ARTERY was free of significant disease.   THE LEFT ANTERIOR DESCENDING ARTERY had heavy calcification proximally.  The  LAD gave rise to 2 sepal perforators, a small diagonal branch, and then a  moderate diagonal branch.  There was a moderately long area of segmental  disease in the proximal LAD of 50 to 70% with calcification.  There was also  50% ostial stenosis in the first small diagonal branch.   THE CIRCUMFLEX ARTERY gave rise to a small marginal branch, a large marginal  branch, and a posterolateral branch.  These vessels were irregular, and  there was no significant obstruction.   THE RIGHT CORONARY ARTERY gave rise to a conus, had a 95% proximal stenosis  and a 50% proximal stenosis with TIMI-2  flow distally.  The right coronary  artery gave rise to a right ventricular branch, a posterior descending  branch, and 4 posterolateral branches.  The distal right coronary artery  also filled faintly by collaterals from the left coronary artery.   THE LEFT VENTRICULOGRAM performed in the RAO projection showed hypokinesis  of the anterolateral wall and apex.  The inferior wall moved well.  The  estimated ejection fraction was 45%.   CONCLUSION:  Coronary artery disease with 50 to 70% stenosis in the proximal  left anterior descending, no significant obstruction in the circumflex  artery, 95% proximal stenosis in the right coronary artery, and  anterolateral wall and apical wall hypokinesis with estimated ejection  fraction of 45%.   RECOMMENDATIONS:  The patient has a tight lesion in the right coronary  artery, and his recent scan showed inferior ischemia.  I think we should  proceed with intervention on the right coronary artery today.  Surprisingly,  the  anterolateral wall and apex are hypokinetic.  This was not a finding on the  Cardiolite scan, and the LAD stenosis does not appear to me to be flow  limiting.  We may want to reevaluate him with an exercise stress Cardiolite  scan later to reassess the significance of the LAD stenosis after he has had  revascularization of his right coronary artery.           ______________________________  Charlies Constable, M.D. [facility]     BB/MEDQ  D:  07/06/2005  T:  07/06/2005  Job:  [id number]   cc:   Geoffry Paradise, M.D.  Fax: [phone]   Rollene Rotunda, M.D.  [address]  [address]  [place]  [place] [address]   Cardiopulmonary Lab

## 2011-01-09 NOTE — Discharge Summary (Signed)
NAME:  [name] [name], [name] [name] NO.:  [id number]   MEDICAL RECORD NO.:  [id number]          PATIENT TYPE:  INP   LOCATION:  6522                         FACILITY:  [facility]   PHYSICIAN:  Charlies [name], M.D. [facility] DATE OF BIRTH:  [birth year]   DATE OF ADMISSION:  07/06/2005  DATE OF DISCHARGE:  07/07/2005                                 DISCHARGE SUMMARY   PROCEDURES:  Percutaneous intervention, right coronary artery, July 06, 2005.   REASON FOR ADMISSION:  [name] [name] is a 77 year old male, with no prior history  of coronary artery disease, who was admitted for elective cardiac  catheterization following recent abnormal stress Myoview suggestive of mild  peri-infarct ischemia with preserved LV function.   PRINCIPAL DIAGNOSIS:  Coronary artery disease.  1.  Status post stent, 95% proximal right coronary artery.  2.  CHAMPION trial.  3.  Mild left ventricular dysfunction (ejection fraction 45%).  4.  Residual 50-70% proximal left anterior descending disease.   SECONDARY DIAGNOSES:  1.  Hypertension.  2.  Dyslipidemia.   HOSPITAL COURSE:  The patient presented for outpatient cardiac  catheterization, performed by [name] [name] November 13, and underwent  successful placement of a bare ment stent for treatment of a high-grade  proximal RCA lesion.  The patient was enrolled in the CHAMPION trial and  randomized to either Plavix versus Integrilin.  There were no noted  complications.   Residual anatomy notable for tandem 50-70% proximal LAD lesion with 50%  proximal first diagonal stenosis. [name] [name] suggested a repeat stress  perfusion study in several weeks for reassessment of the LAD lesion.   LV function was mildly depressed (EF 45%) with anterior hypokinesis.   The patient was subsequently enrolled in the CHAMPION study, randomized to  either Plavix versus Integrilin.   The patient was kept for overnight observation, cleared for discharge the  following morning  in hemodynamically stable condition.   DISCHARGE LABORATORY DATA:  Normal CBC.  Potassium 3.5, glucose 108, normal  liver enzymes.  INR 0.9.  CPK 198/6.2.  Lipid profile: Total cholesterol  185, triglycerides 163, HDL 39, and LDL 113 (cholesterol-HDL ratio 4.7).   At time of discharge, the patient was started on Vitorin 10/20.   DISCHARGE MEDICATIONS:  1.  Plavix 75 mg daily (at least 1 year).  2.  Coated aspirin 325 mg daily.  3.  Toprol XL 25 mg daily (new).  4.  Vitorin 10/20 mg daily (new).  5.  Tri-Cor 145 mg daily.  6.  Triamterene/hydrochlorothiazide 37.5/25 mg daily.  7.  Benazepril 20 mg daily.  8.  Nitrostat 0.4 mg p.r.n.   INSTRUCTIONS:  1.  Follow up with [name] [name], P.A.-C. on November 29 at      9:15 a.m.  2.  Arrange followup with [name] [name].   Discharge duration time 33 minutes.      [name] [name], P.A. [facility]    ______________________________  Charlies [name], M.D. [facility]    [name] [name]  D:  07/07/2005  T:  07/07/2005  Job:  [id number]   cc:   [name] [name]  [name] [name], M.D.  Fax: [phone]

## 2011-01-09 NOTE — Op Note (Signed)
[facility]  Patient:    [name] [name], [name] [name]                       MRN: [id number] Proc. Date: 12/15/00 Adm. Date:  [id number] Attending:  Lauree Chandler CC:         Richard A. Jacky Kindle, M.D.   Operative Report  PREOPERATIVE DIAGNOSIS:  Prostate carcinoma.  POSTOPERATIVE DIAGNOSIS:  Prostate carcinoma.  PROCEDURE:  Radical retropubic prostatectomy and bilateral pelvic lymph node dissection.  SURGEON:  Dr. Larey Dresser.  ASSISTANT:  Dr. Ihor Gully.  ANESTHESIA:  General.  INDICATIONS FOR PROCEDURE:  This 77 year old gentleman had a PSA that rose to 6 and biopsy of the prostate revealed bilateral Gleason grade 6 carcinoma and he opted for radical retropubic prostatectomy. He was cleared surgically by Dr. Jacky Kindle and brought to the OR today.  DESCRIPTION OF PROCEDURE:  The patient was brought to the operating room and placed in the supine position. The external genitalia and lower abdomen were prepped and draped in the usual fashion. A 24 French 30 cc Foley was inserted into the bladder. A midline lower abdominal incision was made from the symphysis pubis up to just below the umbilicus. The rectus fascia was separated in the midline and the pelvic retroperitoneal spaces were dissected out bilaterally and both obturator lymph node packets were removed with using metal hemoclips for hemostasis and lymphostasis and there was no injury to the obturator nerve or major vessels. The endopelvic fascia was then taken down bilaterally after coagulating a prominent cartilaginous protrusion from the symphysis pubis. This was obscuring our view until we coagulated it. The puboprostatic ligaments were then divided. The dorsal vein complex was secured with a Hohenfeltner clamp and a #0 Vicryl tie placed for good hemostasis. The dorsal vein complex was then divided and the urethra was divided just beyond the apex of the prostate. The posterior urethra was  isolated and divided and the prostate came up nicely off the rectum. The prostatic pedicles were divided and ligated with metal hemoclips. The neurovascular nerve bundles were pushed away laterally. The posterior aspect of the seminal vesicles were exposed with blunt dissection. The anterior bladder neck was then opened and indigo carmine was seen to excrete from both ureteral orifices well away from the bladder neck. The posterior bladder neck was divided and the plane between the bladder floor and seminal vesicles was found and separated bluntly. The seminal vesicles were removed in entirety and vas deferens on each side were ligated with metal hemoclips, the specimen was removed intact. The bladder neck mucosa was reapproximated to the bladder neck muscle with interrupted 4-0 chromic catgut. The posterior bladder neck was closed in tennis racquet fashion with running 2-0 chromic catgut. This created a nice protruding bladder neck. A 22 French 5 cc Foley was then placed per urethra and 2-0 Vicryl sutures and UR-5 needles were placed in 2, 5, 7 and 10 oclock and subsequently at 12 oclock and then sutured to the membranous urethra. The Foley catheter was placed in the bladder after tying the heavy #1 Prolene to the tip and bringing it out through the anterior wall of the bladder and subsequently through the abdominal wall and sewed to his skin button at skin level to the right of our incision. The Foley catheter balloon was filled with 15 cc of water and the bladder neck brought down nicely in the pelvis and the anastomotic sutures tied down. The bladder was irrigated with  antibiotic solution and there was no extravasation. Through the stab wound to the left of our incision, a large Blake drain was placed to drain the prevesical space and was sutured to the skin level with a black silk. It was placed to bulb suction. The wound was irrigated and the rectus fascia closed with running #1 PDS,  the subcu was irrigated and the skin closed with skin staples. A dry sterile gauze dressing was applied, the Foley catheter was connected to close drainage and put on traction. Sponge, needle and instrument counts were correct. Estimated blood loss was 400-500 cc. The patient tolerated the procedure well and was taken to the recovery room in good condition. DD:  12/15/00 TD:  12/15/00 Job: [id number]

## 2011-01-09 NOTE — Discharge Summary (Signed)
[facility]  Patient:    [name] [name], [name] [name]                       MRN: [id number] Adm. Date:  [id number] Disc. Date: 12/21/00 Attending:  Lauree Chandler                           Discharge Summary  FINAL DIAGNOSES: 1. Prostatic carcinoma. 2. Anemia due to postoperative hemorrhage. 3. Hypertension.  PROCEDURES:  Radical retropubic prostatectomy and bilateral pelvic lymph node dissection December 13, 2000.  HISTORY OF PRESENT ILLNESS:  This 77 year old white male had PSA of 6.0 and a biopsy showed Gleason grade 6 carcinoma and he was brought to the hospital for radical retropubic prostatectomy.  Physical exam was noncontributory.  HOSPITAL COURSE:  After admission, he underwent a radical retropubic prostatectomy.  The procedure went very well and there was no undue blood loss.  However, postoperatively, his hemoglobin drifted down to 6.4 the next day after surgery and he was transfused two units of packed cells.  His hemoglobin rose to 7.4 on December 17, 2000, but as it started dropping again, he was given another unit of packed cells and a CT scan on the evening of December 17, 2000 did confirm a pelvic hematoma, mostly anterior.  His blood pressure had been somewhat low but he had no significant tachycardia.  Hemoglobin was down to 5.8 on December 18, 2000 and two more units of packed cells were given.  He ended up with one more unit but a CT scan on the morning of December 19, 2000 showed the hematoma was smaller and his hemoglobin on the morning of December 19, 2000 was 8.2, and the hemoglobin on the day of discharge on December 21, 2000 was 9.3 without further blood transfusions. His blood pressure was starting to climb up again and we restarted his blood pressure pills which had been held.  His urine is clear.  He is eating well, afebrile, and ready for discharge on December 21, 2000.  DISCHARGE MEDICATIONS: 1. He will go home on his usual medications of Lotensin  20 mg q.d. 2. Triamterene/HCTZ at a dosage of 75/50. 3. He will also use Tylox for pain. 4. Surfak as a stool softener.  FOLLOW-UP:  He will return to the office next week in follow-up.  CONDITION ON DISCHARGE:  He was sent home in good condition.  DISCHARGE INSTRUCTIONS:  He will go home with a Foley catheter attached to his leg bag and to resume diet as tolerated but remain on light activity. DD:  12/21/00 TD:  12/21/00 Job: [id number]

## 2011-08-28 ENCOUNTER — Encounter: Payer: Self-pay | Admitting: *Deleted

## 2011-09-02 ENCOUNTER — Encounter: Payer: Self-pay | Admitting: *Deleted

## 2011-09-03 ENCOUNTER — Ambulatory Visit (INDEPENDENT_AMBULATORY_CARE_PROVIDER_SITE_OTHER): Payer: Medicare Other | Admitting: Cardiovascular Disease

## 2011-09-03 ENCOUNTER — Encounter: Payer: Self-pay | Admitting: Cardiovascular Disease

## 2011-09-03 ENCOUNTER — Other Ambulatory Visit: Payer: Self-pay

## 2011-09-03 ENCOUNTER — Telehealth: Payer: Self-pay | Admitting: Cardiovascular Disease

## 2011-09-03 VITALS — BP 140/80 | HR 55 | Wt 210.0 lb

## 2011-09-03 DIAGNOSIS — I2510 Atherosclerotic heart disease of native coronary artery without angina pectoris: Secondary | ICD-10-CM | POA: Diagnosis not present

## 2011-09-03 DIAGNOSIS — E785 Hyperlipidemia, unspecified: Secondary | ICD-10-CM

## 2011-09-03 DIAGNOSIS — I10 Essential (primary) hypertension: Secondary | ICD-10-CM

## 2011-09-03 MED ORDER — NITROGLYCERIN 0.4 MG SL SUBL: 0.4000 mg | SUBLINGUAL_TABLET | SUBLINGUAL | Status: DC | PRN

## 2011-09-03 NOTE — Progress Notes (Signed)
[name] [name] is 78 years old and returns for management of CAD. 10 2006 he had a bare-metal stent to the RCA and had residual 50-70% narrowing in the LAD. He has good LV function.  He has done quite well over the past year with no chest pain joints breath or palpitations. He works at the [employer] and is outside in hot weather much of the time and has had no symptoms related to this. He walks 2 miles 3 days a week.  I recently cared for his wife who had and MI after knee surgery requiring extensive hospital stay and rehab. 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  All other systems reviewed and negative  General: Affect appropriate Healthy:  appears stated age HEENT: normal Neck supple with no adenopathy JVP normal no bruits no thyromegaly Lungs clear with no wheezing and good diaphragmatic motion Heart:  S1/S2 no murmur,rub, gallop or click PMI normal Abdomen: benighn, BS positve, no tenderness, no AAA no bruit.  No HSM or HJR Distal pulses intact with no bruits No edema Neuro non-focal Skin warm and dry No muscular weakness   Current Outpatient Prescriptions  Medication Sig Dispense Refill  . amLODipine-benazepril (LOTREL) 5-20 MG per capsule Take 1 capsule by mouth daily.      Marland Kitchen aspirin 81 MG tablet Take 160 mg by mouth daily.      . clopidogrel (PLAVIX) 75 MG tablet Take 75 mg by mouth daily.      Marland Kitchen ezetimibe-simvastatin (VYTORIN) 10-20 MG per tablet Take 1 tablet by mouth at bedtime.      . fish oil-omega-3 fatty acids 1000 MG capsule Take 1 g by mouth daily.      . metoprolol succinate (TOPROL-XL) 25 MG 24 hr tablet Take 25 mg by mouth daily.      . nitroGLYCERIN (NITROSTAT) 0.4 MG SL tablet Place 0.4 mg under the tongue  every 5 (five) minutes as needed.      . triamterene-hydrochlorothiazide (MAXZIDE) 75-50 MG per tablet Take 1 tablet by mouth daily.        Allergies  Review of patient's allergies indicates no known allergies.  Electrocardiogram:  NSR rate 55  LVH PR 256  Nonspecific inferolateral T wave changes  Only change from 2010 is PR increased from 210 msec  Assessment and Plan

## 2011-09-03 NOTE — Assessment & Plan Note (Signed)
Cholesterol is at goal.  Continue current dose of statin and diet Rx.  No myalgias or side effects.  F/U  LFT's in 6 months. No results found for this basename: LDLCALC  Labs with primary            

## 2011-09-03 NOTE — Assessment & Plan Note (Signed)
Well controlled.  Continue current medications and low sodium Dash type diet.    

## 2011-09-03 NOTE — Assessment & Plan Note (Signed)
Stable with no angina and good activity level.  Continue medical Rx  

## 2011-09-03 NOTE — Patient Instructions (Signed)
Your physician wants you to follow-up in: 1 year. You will receive a reminder letter in the mail two months in advance. If you don't receive a letter, please call our office to schedule the follow-up appointment.  

## 2011-09-03 NOTE — Telephone Encounter (Signed)
Pt requesting a refill of his nitro.  Nitro refilled at [facility] per pt request.

## 2011-09-03 NOTE — Telephone Encounter (Signed)
New problem Pt was here today and wants to talk to you about his meds. Please call him back

## 2011-09-21 DIAGNOSIS — C61 Malignant neoplasm of prostate: Secondary | ICD-10-CM | POA: Diagnosis not present

## 2011-09-21 DIAGNOSIS — E785 Hyperlipidemia, unspecified: Secondary | ICD-10-CM | POA: Diagnosis not present

## 2011-09-21 DIAGNOSIS — I10 Essential (primary) hypertension: Secondary | ICD-10-CM | POA: Diagnosis not present

## 2011-09-28 DIAGNOSIS — I10 Essential (primary) hypertension: Secondary | ICD-10-CM | POA: Diagnosis not present

## 2011-09-28 DIAGNOSIS — Z0000 Encounter for general adult medical examination without abnormal findings: Secondary | ICD-10-CM | POA: Diagnosis not present

## 2011-09-28 DIAGNOSIS — E785 Hyperlipidemia, unspecified: Secondary | ICD-10-CM | POA: Diagnosis not present

## 2011-09-28 DIAGNOSIS — I2510 Atherosclerotic heart disease of native coronary artery without angina pectoris: Secondary | ICD-10-CM | POA: Diagnosis not present

## 2011-09-28 DIAGNOSIS — Z125 Encounter for screening for malignant neoplasm of prostate: Secondary | ICD-10-CM | POA: Diagnosis not present

## 2011-10-07 DIAGNOSIS — Z1212 Encounter for screening for malignant neoplasm of rectum: Secondary | ICD-10-CM | POA: Diagnosis not present

## 2011-12-07 ENCOUNTER — Other Ambulatory Visit: Payer: Self-pay | Admitting: *Deleted

## 2011-12-07 ENCOUNTER — Telehealth: Payer: Self-pay | Admitting: Cardiovascular Disease

## 2011-12-07 MED ORDER — CLOPIDOGREL BISULFATE 75 MG PO TABS: 75.0000 mg | ORAL_TABLET | Freq: Every day | ORAL | Status: DC

## 2011-12-07 NOTE — Telephone Encounter (Signed)
Walk In pt Form " Pt Needs Meds Called In: sent to [facility]  12/07/11/KM

## 2011-12-31 DIAGNOSIS — Z483 Aftercare following surgery for neoplasm: Secondary | ICD-10-CM | POA: Diagnosis not present

## 2012-03-21 DIAGNOSIS — H52209 Unspecified astigmatism, unspecified eye: Secondary | ICD-10-CM | POA: Diagnosis not present

## 2012-03-21 DIAGNOSIS — H259 Unspecified age-related cataract: Secondary | ICD-10-CM | POA: Diagnosis not present

## 2012-03-21 DIAGNOSIS — H524 Presbyopia: Secondary | ICD-10-CM | POA: Diagnosis not present

## 2012-03-21 DIAGNOSIS — H02829 Cysts of unspecified eye, unspecified eyelid: Secondary | ICD-10-CM | POA: Diagnosis not present

## 2012-04-04 DIAGNOSIS — Z85828 Personal history of other malignant neoplasm of skin: Secondary | ICD-10-CM | POA: Diagnosis not present

## 2012-07-27 ENCOUNTER — Telehealth: Payer: Self-pay | Admitting: Cardiovascular Disease

## 2012-07-27 NOTE — Telephone Encounter (Signed)
Pt is almost out of amlod-benazp 5-20mg  qd and Dr. Eden Emms told him at last visit there would be some changes to his medication and patient refuses to see PA so I made and appt with Dr. Eden Emms for 12/17 but pt only has 3 days of medication left and they only want to talk to you or Dr. Eden Emms regarding this

## 2012-07-28 NOTE — Telephone Encounter (Signed)
AFTER TALKING TO PT 'S WIFE, PT'S WIFE  DECIDED TO CALL DR ARONSON'S OFFICE  RE  B/P MEDS  AS HE WAS THE ONE TO PRESCRIBE MED . WILL KEEP  UPCOMING APPT WITH DR Eden Emms .Zack Seal

## 2012-08-09 ENCOUNTER — Ambulatory Visit (INDEPENDENT_AMBULATORY_CARE_PROVIDER_SITE_OTHER): Payer: Medicare Other | Admitting: Cardiovascular Disease

## 2012-08-09 ENCOUNTER — Encounter: Payer: Self-pay | Admitting: Cardiovascular Disease

## 2012-08-09 VITALS — BP 130/85 | HR 68 | Ht 70.0 in | Wt 209.0 lb

## 2012-08-09 DIAGNOSIS — I440 Atrioventricular block, first degree: Secondary | ICD-10-CM | POA: Diagnosis not present

## 2012-08-09 NOTE — Progress Notes (Signed)
Patient ID: [name] [name], male   DOB: 03/14/1932, 78 y.o.   MRN: [id number] [name] is 78 years old and returns for management of CAD. 10 2006 he had a bare-metal stent to the RCA and had residual 50-70% narrowing in the LAD. He has good LV function.  He has done quite well over the past year with no chest pain joints breath or palpitations. He works at the [employer] and is outside in hot weather much of the time and has had no symptoms related to this. He walks 2 miles 3 days a week.  Wants to change lotrel becuause it is too expensive  I recently cared for his wife who had and MI after knee surgery requiring extensive hospital stay and rehab. 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  All other systems reviewed and negative  General: Affect appropriate Healthy:  appears stated age HEENT: normal Neck supple with no adenopathy JVP normal no bruits no thyromegaly Lungs clear with no wheezing and good diaphragmatic motion Heart:  S1/S2 no murmur, no rub, gallop or click PMI normal Abdomen: benighn, BS positve, no tenderness, no AAA no bruit.  No HSM or HJR Distal pulses intact with no bruits No edema Neuro non-focal Skin warm and dry No muscular weakness   Current Outpatient Prescriptions  Medication Sig Dispense Refill  . amLODipine-benazepril (LOTREL) 5-20 MG per capsule Take 1 capsule by mouth daily.      Marland Kitchen aspirin 81 MG tablet Take 160 mg by mouth daily.      . clopidogrel (PLAVIX) 75 MG tablet Take 1 tablet (75 mg total) by mouth daily.  30 tablet  11  . ezetimibe-simvastatin (VYTORIN) 10-20 MG per tablet Take 1 tablet by mouth at bedtime.      . fish oil-omega-3 fatty acids 1000 MG capsule Take 1 g by mouth daily.       . metoprolol succinate (TOPROL-XL) 25 MG 24 hr tablet Take 25 mg by mouth daily.      . nitroGLYCERIN (NITROSTAT) 0.4 MG SL tablet Place 1 tablet (0.4 mg total) under the tongue every 5 (five) minutes as needed.  25 tablet  11  . triamterene-hydrochlorothiazide (MAXZIDE) 75-50 MG per tablet Take 1 tablet by mouth daily.        Allergies  Review of patient's allergies indicates no known allergies.  Electrocardiogram:  09/03/11 SR rate 55 PR 256 inferolateral T wave changes  Assessment and Plan

## 2012-08-09 NOTE — Assessment & Plan Note (Signed)
Cholesterol is at goal.  Continue current dose of statin and diet Rx.  No myalgias or side effects.  F/U  LFT's in 6 months. No results found for this basename: LDLCALC             

## 2012-08-09 NOTE — Assessment & Plan Note (Signed)
Stable with no angina and good activity level.  Continue medical Rx  

## 2012-08-09 NOTE — Patient Instructions (Addendum)
Your physician wants you to follow-up in: YEAR WITH DR Haywood Filler will receive a reminder letter in the mail two months in advance. If you don't receive a letter, please call our office to schedule the follow-up appointment. Your physician recommends that you continue on your current medications as directed. Please refer to the Current Medication list given to you today.WILL CALL THE  FIRST OF YEAR NEEDING AMLODIPINE 5 MG EVERY DAY AND  BENAZEPRIL 20 MG  EVERY DAY

## 2012-08-09 NOTE — Assessment & Plan Note (Signed)
No syncope and active at [facility]  F/U ECG in a year At some point may not be able to tolerate calcium blocker and beta blocker

## 2012-08-09 NOTE — Assessment & Plan Note (Signed)
Change lotrel to generic amlodipine and lotensin 20mg   Low sodium diet

## 2012-08-10 ENCOUNTER — Telehealth: Payer: Self-pay | Admitting: Cardiovascular Disease

## 2012-08-15 ENCOUNTER — Telehealth: Payer: Self-pay | Admitting: Cardiovascular Disease

## 2012-08-15 MED ORDER — AMLODIPINE BESYLATE 5 MG PO TABS: 5.0000 mg | ORAL_TABLET | Freq: Every day | ORAL | Status: DC

## 2012-08-15 MED ORDER — BENAZEPRIL HCL 20 MG PO TABS: 20.0000 mg | ORAL_TABLET | Freq: Every day | ORAL | Status: AC

## 2012-08-15 NOTE — Telephone Encounter (Signed)
SCRIPTS PHONED IN  TO [facility] FOR  AMLODIPINE 2 MG AND BENAZEPRIL  20 MG  NUMBER 90  WITH  3 REFILLS ON BOTH .[name] [name]

## 2012-08-15 NOTE — Telephone Encounter (Signed)
TYPO AMLODIPINE IS 5 MG

## 2012-08-15 NOTE — Telephone Encounter (Signed)
New problem:   Need to discuss changing of medicaiton amlopidine .

## 2012-08-15 NOTE — Telephone Encounter (Signed)
F/U   Patient stated only thing he need was a  mediation called in amlopdine to [facility] patient is asking for a called back

## 2012-09-01 DIAGNOSIS — F39 Unspecified mood [affective] disorder: Secondary | ICD-10-CM | POA: Diagnosis not present

## 2012-09-01 DIAGNOSIS — E785 Hyperlipidemia, unspecified: Secondary | ICD-10-CM | POA: Diagnosis not present

## 2012-09-01 DIAGNOSIS — E669 Obesity, unspecified: Secondary | ICD-10-CM | POA: Diagnosis not present

## 2012-09-01 DIAGNOSIS — I10 Essential (primary) hypertension: Secondary | ICD-10-CM | POA: Diagnosis not present

## 2012-09-06 ENCOUNTER — Ambulatory Visit: Payer: BLUE CROSS/BLUE SHIELD | Admitting: Cardiovascular Disease

## 2012-09-26 DIAGNOSIS — E785 Hyperlipidemia, unspecified: Secondary | ICD-10-CM | POA: Diagnosis not present

## 2012-09-26 DIAGNOSIS — Z125 Encounter for screening for malignant neoplasm of prostate: Secondary | ICD-10-CM | POA: Diagnosis not present

## 2012-09-26 DIAGNOSIS — I10 Essential (primary) hypertension: Secondary | ICD-10-CM | POA: Diagnosis not present

## 2012-10-03 DIAGNOSIS — Z1212 Encounter for screening for malignant neoplasm of rectum: Secondary | ICD-10-CM | POA: Diagnosis not present

## 2012-10-03 LAB — IFOBT (OCCULT BLOOD): IFOBT: NEGATIVE

## 2012-11-14 ENCOUNTER — Encounter: Payer: Self-pay | Admitting: Internal Medicine

## 2012-11-24 DIAGNOSIS — Z125 Encounter for screening for malignant neoplasm of prostate: Secondary | ICD-10-CM | POA: Diagnosis not present

## 2012-11-24 DIAGNOSIS — F39 Unspecified mood [affective] disorder: Secondary | ICD-10-CM | POA: Diagnosis not present

## 2012-11-24 DIAGNOSIS — E669 Obesity, unspecified: Secondary | ICD-10-CM | POA: Diagnosis not present

## 2012-11-24 DIAGNOSIS — I2510 Atherosclerotic heart disease of native coronary artery without angina pectoris: Secondary | ICD-10-CM | POA: Diagnosis not present

## 2012-11-24 DIAGNOSIS — G252 Other specified forms of tremor: Secondary | ICD-10-CM | POA: Diagnosis not present

## 2012-11-24 DIAGNOSIS — I10 Essential (primary) hypertension: Secondary | ICD-10-CM | POA: Diagnosis not present

## 2012-11-24 DIAGNOSIS — G250 Essential tremor: Secondary | ICD-10-CM | POA: Diagnosis not present

## 2012-11-24 DIAGNOSIS — Z0000 Encounter for general adult medical examination without abnormal findings: Secondary | ICD-10-CM | POA: Diagnosis not present

## 2012-11-24 DIAGNOSIS — E785 Hyperlipidemia, unspecified: Secondary | ICD-10-CM | POA: Diagnosis not present

## 2013-02-16 DIAGNOSIS — H259 Unspecified age-related cataract: Secondary | ICD-10-CM | POA: Diagnosis not present

## 2013-02-16 DIAGNOSIS — H5200 Hypermetropia, unspecified eye: Secondary | ICD-10-CM | POA: Diagnosis not present

## 2013-02-16 DIAGNOSIS — H524 Presbyopia: Secondary | ICD-10-CM | POA: Diagnosis not present

## 2013-02-16 DIAGNOSIS — H52209 Unspecified astigmatism, unspecified eye: Secondary | ICD-10-CM | POA: Diagnosis not present

## 2013-06-01 DIAGNOSIS — Z6829 Body mass index (BMI) 29.0-29.9, adult: Secondary | ICD-10-CM | POA: Diagnosis not present

## 2013-06-01 DIAGNOSIS — E785 Hyperlipidemia, unspecified: Secondary | ICD-10-CM | POA: Diagnosis not present

## 2013-06-01 DIAGNOSIS — I2510 Atherosclerotic heart disease of native coronary artery without angina pectoris: Secondary | ICD-10-CM | POA: Diagnosis not present

## 2013-06-01 DIAGNOSIS — I10 Essential (primary) hypertension: Secondary | ICD-10-CM | POA: Diagnosis not present

## 2013-06-01 DIAGNOSIS — E669 Obesity, unspecified: Secondary | ICD-10-CM | POA: Diagnosis not present

## 2013-06-01 DIAGNOSIS — C61 Malignant neoplasm of prostate: Secondary | ICD-10-CM | POA: Diagnosis not present

## 2013-12-04 ENCOUNTER — Encounter: Payer: Self-pay | Admitting: Internal Medicine

## 2013-12-08 DIAGNOSIS — E785 Hyperlipidemia, unspecified: Secondary | ICD-10-CM | POA: Diagnosis not present

## 2013-12-08 DIAGNOSIS — I10 Essential (primary) hypertension: Secondary | ICD-10-CM | POA: Diagnosis not present

## 2013-12-08 DIAGNOSIS — Z125 Encounter for screening for malignant neoplasm of prostate: Secondary | ICD-10-CM | POA: Diagnosis not present

## 2013-12-11 DIAGNOSIS — E669 Obesity, unspecified: Secondary | ICD-10-CM | POA: Diagnosis not present

## 2013-12-11 DIAGNOSIS — E785 Hyperlipidemia, unspecified: Secondary | ICD-10-CM | POA: Diagnosis not present

## 2013-12-11 DIAGNOSIS — I2510 Atherosclerotic heart disease of native coronary artery without angina pectoris: Secondary | ICD-10-CM | POA: Diagnosis not present

## 2013-12-11 DIAGNOSIS — G250 Essential tremor: Secondary | ICD-10-CM | POA: Diagnosis not present

## 2013-12-11 DIAGNOSIS — Z1331 Encounter for screening for depression: Secondary | ICD-10-CM | POA: Diagnosis not present

## 2013-12-11 DIAGNOSIS — Z0000 Encounter for general adult medical examination without abnormal findings: Secondary | ICD-10-CM | POA: Diagnosis not present

## 2013-12-11 DIAGNOSIS — Z125 Encounter for screening for malignant neoplasm of prostate: Secondary | ICD-10-CM | POA: Diagnosis not present

## 2013-12-11 DIAGNOSIS — I10 Essential (primary) hypertension: Secondary | ICD-10-CM | POA: Diagnosis not present

## 2013-12-12 DIAGNOSIS — Z1212 Encounter for screening for malignant neoplasm of rectum: Secondary | ICD-10-CM | POA: Diagnosis not present

## 2013-12-12 LAB — IFOBT (OCCULT BLOOD): IFOBT: POSITIVE

## 2013-12-14 ENCOUNTER — Telehealth: Payer: Self-pay | Admitting: Internal Medicine

## 2013-12-14 ENCOUNTER — Encounter: Payer: Self-pay | Admitting: *Deleted

## 2013-12-14 NOTE — Telephone Encounter (Signed)
Spoke with patient and scheduled him on 12/19/13 at 3:00 PM.

## 2013-12-15 ENCOUNTER — Encounter: Payer: Self-pay | Admitting: *Deleted

## 2013-12-19 ENCOUNTER — Ambulatory Visit: Payer: BLUE CROSS/BLUE SHIELD | Admitting: Internal Medicine

## 2013-12-26 ENCOUNTER — Telehealth: Payer: Self-pay | Admitting: *Deleted

## 2013-12-26 ENCOUNTER — Encounter: Payer: Self-pay | Admitting: Internal Medicine

## 2013-12-26 ENCOUNTER — Ambulatory Visit (INDEPENDENT_AMBULATORY_CARE_PROVIDER_SITE_OTHER): Payer: Medicare Other | Admitting: Internal Medicine

## 2013-12-26 VITALS — BP 138/80 | HR 86 | Ht 70.0 in | Wt 207.0 lb

## 2013-12-26 DIAGNOSIS — D689 Coagulation defect, unspecified: Secondary | ICD-10-CM

## 2013-12-26 DIAGNOSIS — R195 Other fecal abnormalities: Secondary | ICD-10-CM | POA: Diagnosis not present

## 2013-12-26 NOTE — Telephone Encounter (Signed)
12/26/2013  RE: [name] [name] DOB: [birth year] MRN: [id number]  Dear Dr Johnsie Cancel,   We would like to schedule the above patient for a colonoscopy procedure. Our records show that he is on anticoagulation therapy. Please advise as to whether the patient may come off his therapy of Plavix 5 days prior to the procedure. Please route your response to Quintin Alto, CMA.   Sincerely,  Larina Bras

## 2013-12-26 NOTE — Progress Notes (Signed)
[name] [name] Jun 06, 1932 [id number]  Note: This dictation was prepared with Dragon digital system. Any transcriptional errors that result from this procedure are unintentional.   History of Present Illness: This is a 80 year old white male with known coronary artery disease and bare-metal stent placement in 2006 to RCA with normal left ventricular function, on Plavix and aspirin. He is due for recall colonoscopy. His hemoglobin is normal at 14.6. He denies any visible blood per rectum. On last colonoscopy in March 2012 patient had multiple polyps total of 6 adenomatous polyps. There was no high-grade dysplasia. He is Hemoccult positive on 12/12/2013 . The patient claims that he was taking Pepto-Bismol at the time. Prior  test for occult blood in stools in 2014 and was negative. He denies abdominal pain. His bowel habits have been regular    Past Medical History  Diagnosis Date  . HYPERTENSION, BENIGN   . HYPERLIPIDEMIA-MIXED   . CAD, NATIVE VESSEL     post stenting of the RCA in 06/2005 with a bare-metal stent with residual 50-70% narrowing inb the left anterior descending coronary artery   . PROSTATE CANCER, HX OF   . Diverticulosis   . Tubular adenoma   . Hemorrhoids   . Lumbar stenosis     Past Surgical History  Procedure Laterality Date  . Coronary stent placement      CAD post stenting of the RCA in 06/2005 with a bare-metal stent with residual 50-70% narrowing inb the left anterior descending coronary artery   . Prostatectomy    . Appendectomy    . Mohs surgery      x 2  . Cardiac catheterization      No Known Allergies  Family history and social history have been reviewed.  Review of Systems:   The remainder of the 10 point ROS is negative except as outlined in the H&P  Physical Exam: General Appearance Well developed, in no distress Psychological Normal mood and affect  Assessment and Plan:   80 year old white male with positive Hemoccult cards and  no visible  blood per rectum. He has a history of 6 adenomatous polyps removed on last colonoscopy in March 2012. He is on Plavix and aspirin 81 mg daily. We will schedule him for colonoscopy and will ask  Dr. Johnsie Cancel to okay to holding  Plavix for 5 days prior to the procedure. We have discussed prep as well as sedation. Patient will have his wife to bring him for the procedure which will be done at all on a Friday per his request. In case we remove polyps he will need to hold Plavix for additional 10-14 days.    [place] Dragon 12/26/2013

## 2013-12-26 NOTE — Patient Instructions (Addendum)
It has been recommended to you by your physician that you have a(n) colonoscopy completed. Per your request, we did not schedule the procedure(s) today (as we were unable to accommodate your request for a Friday procedure). Please contact our office at [phone] when you decide to have the procedure completed.  CC:Dr Burnard Bunting, Dr Mamie Nick.[employer]

## 2013-12-27 NOTE — Telephone Encounter (Signed)
Ok to hold plavix 5 days before colonoscoopy

## 2013-12-28 NOTE — Telephone Encounter (Signed)
I have spoken to patient to advise that per Dr Johnsie Cancel, he may hold plavix 5 days prior to his colonoscopy. He has also been scheduled for colonoscopy (he was unable to schedule while in office as we did not have any availabilities previously) for 03/02/14 as well as previsit on 02/12/14. He verbalizes understanding of all of the above.

## 2014-01-30 ENCOUNTER — Ambulatory Visit: Payer: BLUE CROSS/BLUE SHIELD | Admitting: Internal Medicine

## 2014-02-12 ENCOUNTER — Ambulatory Visit (AMBULATORY_SURGERY_CENTER): Payer: Self-pay | Admitting: *Deleted

## 2014-02-12 VITALS — Ht 70.0 in | Wt 212.0 lb

## 2014-02-12 DIAGNOSIS — R195 Other fecal abnormalities: Secondary | ICD-10-CM

## 2014-02-12 DIAGNOSIS — Z86010 Personal history of colonic polyps: Secondary | ICD-10-CM

## 2014-02-12 MED ORDER — MOVIPREP 100 G PO SOLR: ORAL | Status: DC

## 2014-02-12 NOTE — Progress Notes (Signed)
Patient denies any allergies to eggs or soy. Patient denies any problems with anesthesia/sedation. Patient denies any oxygen use at home and does not take any diet/weight loss medications. EMMI education assisgned to patient on colonoscopy, this was explained and instructions given to patient. 

## 2014-02-16 DIAGNOSIS — H5200 Hypermetropia, unspecified eye: Secondary | ICD-10-CM | POA: Diagnosis not present

## 2014-02-16 DIAGNOSIS — H52209 Unspecified astigmatism, unspecified eye: Secondary | ICD-10-CM | POA: Diagnosis not present

## 2014-02-16 DIAGNOSIS — H02409 Unspecified ptosis of unspecified eyelid: Secondary | ICD-10-CM | POA: Diagnosis not present

## 2014-02-16 DIAGNOSIS — H259 Unspecified age-related cataract: Secondary | ICD-10-CM | POA: Diagnosis not present

## 2014-02-21 HISTORY — PX: COLONOSCOPY: SHX174

## 2014-03-02 ENCOUNTER — Ambulatory Visit (AMBULATORY_SURGERY_CENTER): Payer: Medicare Other | Admitting: Internal Medicine

## 2014-03-02 ENCOUNTER — Encounter: Payer: Self-pay | Admitting: Internal Medicine

## 2014-03-02 VITALS — BP 146/71 | HR 52 | Temp 96.5°F | Resp 13 | Ht 70.0 in | Wt 212.0 lb

## 2014-03-02 DIAGNOSIS — Z86010 Personal history of colonic polyps: Secondary | ICD-10-CM | POA: Diagnosis not present

## 2014-03-02 DIAGNOSIS — Z1211 Encounter for screening for malignant neoplasm of colon: Secondary | ICD-10-CM | POA: Diagnosis not present

## 2014-03-02 DIAGNOSIS — I2510 Atherosclerotic heart disease of native coronary artery without angina pectoris: Secondary | ICD-10-CM | POA: Diagnosis not present

## 2014-03-02 DIAGNOSIS — D689 Coagulation defect, unspecified: Secondary | ICD-10-CM

## 2014-03-02 DIAGNOSIS — D126 Benign neoplasm of colon, unspecified: Secondary | ICD-10-CM

## 2014-03-02 MED ORDER — SODIUM CHLORIDE 0.9 % IV SOLN: 500.0000 mL | INTRAVENOUS | Status: DC

## 2014-03-02 NOTE — Progress Notes (Signed)
A/ox3 pleased with MAC, report to Jane RN 

## 2014-03-02 NOTE — Patient Instructions (Signed)
YOU HAD AN ENDOSCOPIC PROCEDURE TODAY AT THE [facility]: Refer to the procedure report that was given to you for any specific questions about what was found during the examination.  If the procedure report does not answer your questions, please call your gastroenterologist to clarify.  If you requested that your care partner not be given the details of your procedure findings, then the procedure report has been included in a sealed envelope for you to review at your convenience later.  YOU SHOULD EXPECT: Some feelings of bloating in the abdomen. Passage of more gas than usual.  Walking can help get rid of the air that was put into your GI tract during the procedure and reduce the bloating. If you had a lower endoscopy (such as a colonoscopy or flexible sigmoidoscopy) you may notice spotting of blood in your stool or on the toilet paper. If you underwent a bowel prep for your procedure, then you may not have a normal bowel movement for a few days.  DIET: Your first meal following the procedure should be a light meal and then it is ok to progress to your normal diet.  A half-sandwich or bowl of soup is an example of a good first meal.  Heavy or fried foods are harder to digest and may make you feel nauseous or bloated.  Likewise meals heavy in dairy and vegetables can cause extra gas to form and this can also increase the bloating.  Drink plenty of fluids but you should avoid alcoholic beverages for 24 hours.  ACTIVITY: Your care partner should take you home directly after the procedure.  You should plan to take it easy, moving slowly for the rest of the day.  You can resume normal activity the day after the procedure however you should NOT DRIVE or use heavy machinery for 24 hours (because of the sedation medicines used during the test).    SYMPTOMS TO REPORT IMMEDIATELY: A gastroenterologist can be reached at any hour.  During normal business hours, 8:30 AM to 5:00 PM Monday through Friday,  call [phone].  After hours and on weekends, please call the GI answering service at [phone] who will take a message and have the physician on call contact you.   Following lower endoscopy (colonoscopy or flexible sigmoidoscopy):  Excessive amounts of blood in the stool  Significant tenderness or worsening of abdominal pains  Swelling of the abdomen that is new, acute  Fever of 100F or higher    FOLLOW UP: If any biopsies were taken you will be contacted by phone or by letter within the next 1-3 weeks.  Call your gastroenterologist if you have not heard about the biopsies in 3 weeks.  Our staff will call the home number listed on your records the next business day following your procedure to check on you and address any questions or concerns that you may have at that time regarding the information given to you following your procedure. This is a courtesy call and so if there is no answer at the home number and we have not heard from you through the emergency physician on call, we will assume that you have returned to your regular daily activities without incident.  SIGNATURES/CONFIDENTIALITY: You and/or your care partner have signed paperwork which will be entered into your electronic medical record.  These signatures attest to the fact that that the information above on your After Visit Summary has been reviewed and is understood.  Full responsibility of the confidentiality   of this discharge information lies with you and/or your care-partner.  Polyp, hemorrhoid, diverticulosis and high fiber diet information given.  Await pathology results.  Hold anti coagulation meds for 10 days then resume.

## 2014-03-02 NOTE — Progress Notes (Signed)
Called to room to assist during endoscopic procedure.  Patient ID and intended procedure confirmed with present staff. Received instructions for my participation in the procedure from the performing physician.  

## 2014-03-02 NOTE — Progress Notes (Deleted)
FOLLOW DISCHARGE INSTRUCTIONS ([place])                                        Called to room to assist during endoscopic procedure.  Patient ID and intended procedure confirmed with present staff. Received instructions for my participation in the procedure from the performing physician.

## 2014-03-02 NOTE — Op Note (Addendum)
[facility]  [address]. [place], [address]   COLONOSCOPY PROCEDURE REPORT  PATIENT: [name] [name], [name] [name]  MR#: [id number] BIRTHDATE: Sep 30, 1931 , 81  yrs. old GENDER: Male ENDOSCOPIST: Lafayette Dragon, MD REFERRED [id number] Reynaldo Minium, M.D. PROCEDURE DATE:  03/02/2014 PROCEDURE:   Colonoscopy with snare polypectomy First Screening Colonoscopy - Avg.  risk and is 50 yrs.  old or older - No.  Prior Negative Screening - Now for repeat screening. N/A  History of Adenoma - Now for follow-up colonoscopy & has been > or = to 3 yrs.  Yes hx of adenoma.  Has been 3 or more years since last colonoscopy.  Polyps Removed Today? Yes. ASA CLASS:   Class III INDICATIONS:6 polyps removed from colon in March 2012 all adenomatous.  Patient on chronic anticoagulation which has been on hold for past 5 days. MEDICATIONS: MAC sedation, administered by CRNA and Propofol (Diprivan) 180 mg IV  DESCRIPTION OF PROCEDURE:   After the risks benefits and alternatives of the procedure were thoroughly explained, informed consent was obtained.  A digital rectal exam revealed no abnormalities of the rectum.   The LB [id number]  endoscope was introduced through the anus and advanced to the cecum, which was identified by both the appendix and ileocecal valve. No adverse events experienced.   The quality of the prep was good, using MoviPrep  The instrument was then slowly withdrawn as the colon was fully examined.      COLON FINDINGS: Three polypoid shaped semi-pedunculated polyps ranging between 3-15mm in size were found in the transverse colon, ascending colon, and descending colon.  A polypectomy was performed with a cold snare.  The resection was complete and the polyp tissue was completely retrieved.  Retroflexed views revealed no abnormalities. The time to cecum=6 minutes 26 seconds.  Withdrawal time=12 minutes 30 seconds.  The scope was withdrawn and the procedure  completed. COMPLICATIONS: There were no complications.  ENDOSCOPIC IMPRESSION: 1.   Three semi-pedunculated polyps ranging between 3-72mm in size were found in the transverse colon, ascending colon, and descending colon; polypectomy was performed with a cold snare 2.    moderate diverticulosis of the sigmoid colon  RECOMMENDATIONS: 1.  Await pathology results 2.  hold anti-coagulation x10 days then restart High-fiber diet [place] colonoscopy pending path report   eSigned:  Lafayette Dragon, MD 03/02/2014 8:42 AM   cc:   PATIENT NAME:  Zacary, Bauer MR#: [id number]

## 2014-03-05 ENCOUNTER — Telehealth: Payer: Self-pay | Admitting: *Deleted

## 2014-03-05 NOTE — Telephone Encounter (Signed)
  Follow up Call-  Call back number 03/02/2014  Post procedure Call Back phone  # [phone]  Permission to leave phone message Yes     Patient questions:  Do you have a fever, pain , or abdominal swelling? No. Pain Score  0 *  Have you tolerated food without any problems? Yes.    Have you been able to return to your normal activities? Yes.    Do you have any questions about your discharge instructions: Diet   No. Medications  No. Follow up visit  No.  Do you have questions or concerns about your Care? No.  Actions: * If pain score is 4 or above: No action needed, pain <4.

## 2014-03-06 ENCOUNTER — Encounter: Payer: Self-pay | Admitting: Internal Medicine

## 2014-03-08 ENCOUNTER — Encounter: Payer: Self-pay | Admitting: *Deleted

## 2014-03-08 NOTE — Telephone Encounter (Signed)
Close Encounter 

## 2014-03-13 DIAGNOSIS — H2510 Age-related nuclear cataract, unspecified eye: Secondary | ICD-10-CM | POA: Diagnosis not present

## 2014-03-13 DIAGNOSIS — H25049 Posterior subcapsular polar age-related cataract, unspecified eye: Secondary | ICD-10-CM | POA: Diagnosis not present

## 2014-03-13 DIAGNOSIS — H25019 Cortical age-related cataract, unspecified eye: Secondary | ICD-10-CM | POA: Diagnosis not present

## 2014-06-11 ENCOUNTER — Other Ambulatory Visit: Payer: Self-pay | Admitting: *Deleted

## 2014-06-11 ENCOUNTER — Ambulatory Visit (INDEPENDENT_AMBULATORY_CARE_PROVIDER_SITE_OTHER): Payer: Medicare Other | Admitting: Cardiovascular Disease

## 2014-06-11 ENCOUNTER — Encounter: Payer: Self-pay | Admitting: Cardiovascular Disease

## 2014-06-11 VITALS — BP 144/70 | HR 52 | Resp 11 | Ht 70.0 in | Wt 211.8 lb

## 2014-06-11 DIAGNOSIS — R011 Cardiac murmur, unspecified: Secondary | ICD-10-CM

## 2014-06-11 DIAGNOSIS — E785 Hyperlipidemia, unspecified: Secondary | ICD-10-CM | POA: Diagnosis not present

## 2014-06-11 DIAGNOSIS — I10 Essential (primary) hypertension: Secondary | ICD-10-CM

## 2014-06-11 DIAGNOSIS — G250 Essential tremor: Secondary | ICD-10-CM | POA: Diagnosis not present

## 2014-06-11 DIAGNOSIS — E669 Obesity, unspecified: Secondary | ICD-10-CM | POA: Diagnosis not present

## 2014-06-11 DIAGNOSIS — Z6830 Body mass index (BMI) 30.0-30.9, adult: Secondary | ICD-10-CM | POA: Diagnosis not present

## 2014-06-11 DIAGNOSIS — I2510 Atherosclerotic heart disease of native coronary artery without angina pectoris: Secondary | ICD-10-CM | POA: Diagnosis not present

## 2014-06-11 MED ORDER — AMLODIPINE BESYLATE 10 MG PO TABS: 10.0000 mg | ORAL_TABLET | Freq: Every day | ORAL | Status: DC

## 2014-06-11 NOTE — Assessment & Plan Note (Signed)
Cholesterol is at goal.  Continue current dose of statin and diet Rx.  No myalgias or side effects.  F/U  LFT's in 6 months. No results found for this basename: LDLCALC  Labs with primary            

## 2014-06-11 NOTE — Assessment & Plan Note (Signed)
No symptoms HR only in 60's at gym D/C beta blocker and increase norvasc.  F/U next available to see how HR does

## 2014-06-11 NOTE — Assessment & Plan Note (Signed)
No urgency or frequency  Indicates primary checks PSA  F/U urology as needed

## 2014-06-11 NOTE — Assessment & Plan Note (Signed)
Well controlled.  Continue current medications and low sodium Dash type diet.    

## 2014-06-11 NOTE — Patient Instructions (Signed)
Your physician recommends that you schedule a follow-up appointment in:  NEXT AVAILABLE WITH DR [facility]  Your physician has recommended you make the following change in your medication:   STOP METOPROLOL AND   INCREASE AMLODIPINE  TO  10 MG  EVERY DAY    Your physician has requested that you have an echocardiogram. Echocardiography is a painless test that uses sound waves to create images of your heart. It provides your doctor with information about the size and shape of your heart and how well your heart's chambers and valves are working. This procedure takes approximately one hour. There are no restrictions for this procedure.

## 2014-06-11 NOTE — Progress Notes (Signed)
Patient ID: [name] [name], male   DOB: 11-Jan-1932, 80 y.o.   MRN: [id number] [name] is 80 years old and returns for management of CAD. 10 2006 he had a bare-metal stent to the RCA and had residual 50-70% narrowing in the LAD. He has good LV function.  He has done quite well over the past year with no chest pain joints breath or palpitations. He works at the [employer] and is outside in hot weather much of the time and has had no symptoms related to this. He walks 2 miles 3 days a week. Wants to change lotrel becuause it is too expensive  I  cared for his wife who had and MI after knee surgery requiring extensive hospital stay and rehab. She sees TS  Patient made note of his status as [place] in state games with lots of medals    ROS: Denies fever, malais, weight loss, blurry vision, decreased visual acuity, cough, sputum, SOB, hemoptysis, pleuritic pain, palpitaitons, heartburn, abdominal pain, melena, lower extremity edema, claudication, or rash.  All other systems reviewed and negative  General: Affect appropriate Healthy:  appears stated age 27: normal Neck supple with no adenopathy JVP normal no bruits no thyromegaly Lungs clear with no wheezing and good diaphragmatic motion Heart:  S1/S2 SEM  murmur, no rub, gallop or click PMI normal Abdomen: benighn, BS positve, no tenderness, no AAA no bruit.  No HSM or HJR Distal pulses intact with no bruits No edema Neuro non-focal Skin warm and dry No muscular weakness   Current Outpatient Prescriptions  Medication Sig Dispense Refill  . amLODipine (NORVASC) 5 MG tablet Take 1 tablet (5 mg total) by mouth daily.  90 tablet  4  . aspirin 81 MG tablet Take 160 mg by mouth daily.      . benazepril (LOTENSIN) 20 MG tablet Take 1 tablet (20 mg total) by mouth daily.  90 tablet  4  . clopidogrel (PLAVIX) 75 MG tablet Take 1 tablet (75 mg total) by mouth daily.  30 tablet  11  . ezetimibe-simvastatin  (VYTORIN) 10-20 MG per tablet Take 1 tablet by mouth at bedtime.      . fish oil-omega-3 fatty acids 1000 MG capsule Take 1 g by mouth daily.      . metoprolol succinate (TOPROL-XL) 25 MG 24 hr tablet Take 25 mg by mouth daily.      . nitroGLYCERIN (NITROSTAT) 0.4 MG SL tablet Place 1 tablet (0.4 mg total) under the tongue every 5 (five) minutes as needed.  25 tablet  11  . simvastatin (ZOCOR) 20 MG tablet Take 1 tablet by mouth daily.      Marland Kitchen triamterene-hydrochlorothiazide (MAXZIDE) 75-50 MG per tablet Take 1 tablet by mouth daily.       No current facility-administered medications for this visit.    Allergies  Review of patient's allergies indicates no known allergies.  Electrocardiogram: 1/13  SR rate 55 PR 256  Lateral T wave changes  Today SR rate 50  LAD PR 272    Assessment and Plan

## 2014-06-11 NOTE — Assessment & Plan Note (Signed)
Stable with no angina and good activity level.  Continue medical Rx  

## 2014-06-12 ENCOUNTER — Other Ambulatory Visit (HOSPITAL_COMMUNITY): Payer: Medicare Other

## 2014-06-18 DIAGNOSIS — Z961 Presence of intraocular lens: Secondary | ICD-10-CM | POA: Diagnosis not present

## 2014-06-18 DIAGNOSIS — H2512 Age-related nuclear cataract, left eye: Secondary | ICD-10-CM | POA: Diagnosis not present

## 2014-06-26 ENCOUNTER — Ambulatory Visit (HOSPITAL_COMMUNITY): Payer: Medicare Other | Attending: Cardiovascular Disease | Admitting: Cardiology

## 2014-06-26 DIAGNOSIS — I10 Essential (primary) hypertension: Secondary | ICD-10-CM | POA: Insufficient documentation

## 2014-06-26 DIAGNOSIS — E785 Hyperlipidemia, unspecified: Secondary | ICD-10-CM | POA: Diagnosis not present

## 2014-06-26 DIAGNOSIS — R011 Cardiac murmur, unspecified: Secondary | ICD-10-CM

## 2014-06-26 DIAGNOSIS — I2510 Atherosclerotic heart disease of native coronary artery without angina pectoris: Secondary | ICD-10-CM | POA: Insufficient documentation

## 2014-06-26 NOTE — Progress Notes (Signed)
Echo performed. 

## 2014-07-23 ENCOUNTER — Other Ambulatory Visit: Payer: Self-pay

## 2014-07-23 ENCOUNTER — Telehealth: Payer: Self-pay | Admitting: Cardiovascular Disease

## 2014-07-23 MED ORDER — AMLODIPINE BESYLATE 10 MG PO TABS: 10.0000 mg | ORAL_TABLET | Freq: Every day | ORAL | Status: DC

## 2014-07-23 NOTE — Telephone Encounter (Signed)
Walk in Prescription Form  " Gave To Refill Dept" [place] Dec

## 2014-07-24 ENCOUNTER — Telehealth: Payer: Self-pay | Admitting: Cardiovascular Disease

## 2014-07-29 NOTE — Progress Notes (Signed)
Patient ID: [name] [name], male   DOB: November 06, 1931, 80 y.o.   MRN: [id number] [name] [name] is 80 years old and returns for management of CAD. 10 2006 he had a bare-metal stent to the RCA and had residual 50-70% narrowing in the LAD. He has good LV function.  He has done quite well over the past year with no chest pain joints breath or palpitations. He works at the [employer] and is outside in hot weather much of the time and has had no symptoms related to this. He walks 2 miles 3 days a week. Lorel changed last visit due to cost  I cared for his wife who had and MI after knee surgery requiring extensive hospital stay and rehab. She sees TS  Patient made note of his status as [place] in state games with lots of medals  Reviewed echo done 10/15  Normal EF and mild AS  - Left ventricle: The cavity size was normal. Wall thickness was increased in a pattern of mild LVH. Systolic function was normal. The estimated ejection fraction was in the range of 55% to 60%. Wall motion was normal; there were no regional wall motion abnormalities. Doppler parameters are consistent with abnormal left ventricular relaxation (grade 1 diastolic dysfunction). - Aortic valve: Trileaflet; moderately calcified leaflets. There was mild stenosis. There was trivial regurgitation. Mean gradient (S): 19 mm Hg. Peak gradient (S): 33 mm Hg. Valve area (VTI): 1.78 cm^2. - Mitral valve: Mildly calcified annulus. There was trivial regurgitation. - Left atrium: The atrium was mildly dilated. - Right ventricle: The cavity size was normal. Systolic function was normal. - Tricuspid valve: Peak RV-RA gradient (S): 23 mm Hg. - Pulmonary arteries: PA peak pressure: 26 mm Hg (S). - Inferior vena cava: The vessel was normal in size. The respirophasic diameter changes were in the normal range (>= 50%), consistent with normal central venous pressure.  Impressions:  -  Normal LV size with mild LV hypertrophy. EF 55-60%. Mild aortic stenosis. Normal RV size and systolic function.  No complaints  Has had cataracts done in right eye and left in January     ROS: Denies fever, malais, weight loss, blurry vision, decreased visual acuity, cough, sputum, SOB, hemoptysis, pleuritic pain, palpitaitons, heartburn, abdominal pain, melena, lower extremity edema, claudication, or rash.  All other systems reviewed and negative  General: Affect appropriate Healthy:  appears stated age 80: normal Neck supple with no adenopathy JVP normal no bruits no thyromegaly Lungs clear with no wheezing and good diaphragmatic motion Heart:  S1/S2 AS  murmur, no rub, gallop or click PMI normal Abdomen: benighn, BS positve, no tenderness, no AAA no bruit.  No HSM or HJR Distal pulses intact with no bruits No edema Neuro non-focal Skin warm and dry No muscular weakness   Current Outpatient Prescriptions  Medication Sig Dispense Refill  . amLODipine (NORVASC) 10 MG tablet Take 1 tablet (10 mg total) by mouth daily. 90 tablet 4  . aspirin 81 MG tablet Take 160 mg by mouth daily.    . benazepril (LOTENSIN) 20 MG tablet Take 1 tablet (20 mg total) by mouth daily. 90 tablet 4  . clopidogrel (PLAVIX) 75 MG tablet Take 1 tablet (75 mg total) by mouth daily. 30 tablet 11  . ezetimibe-simvastatin (VYTORIN) 10-20 MG per tablet Take 1 tablet by mouth at bedtime.    . fish oil-omega-3 fatty acids 1000 MG capsule Take 1 g by mouth daily.    Marland Kitchen  nitroGLYCERIN (NITROSTAT) 0.4 MG SL tablet Place 1 tablet (0.4 mg total) under the tongue every 5 (five) minutes as needed. 25 tablet 11  . triamterene-hydrochlorothiazide (MAXZIDE) 75-50 MG per tablet Take 1 tablet by mouth daily.     No current facility-administered medications for this visit.    Allergies  Review of patient's allergies indicates no known allergies.  Electrocardiogram: 10/15  SR rate 50  PR 250  Nonspecific St changes    Assessment and Plan

## 2014-07-30 ENCOUNTER — Ambulatory Visit (INDEPENDENT_AMBULATORY_CARE_PROVIDER_SITE_OTHER): Payer: Medicare Other | Admitting: Cardiovascular Disease

## 2014-07-30 ENCOUNTER — Encounter: Payer: Self-pay | Admitting: Cardiovascular Disease

## 2014-07-30 VITALS — BP 132/62 | HR 66 | Ht 70.0 in | Wt 209.4 lb

## 2014-07-30 DIAGNOSIS — E785 Hyperlipidemia, unspecified: Secondary | ICD-10-CM | POA: Diagnosis not present

## 2014-07-30 DIAGNOSIS — I10 Essential (primary) hypertension: Secondary | ICD-10-CM | POA: Diagnosis not present

## 2014-07-30 DIAGNOSIS — I350 Nonrheumatic aortic (valve) stenosis: Secondary | ICD-10-CM | POA: Diagnosis not present

## 2014-07-30 DIAGNOSIS — I2510 Atherosclerotic heart disease of native coronary artery without angina pectoris: Secondary | ICD-10-CM | POA: Diagnosis not present

## 2014-07-30 NOTE — Assessment & Plan Note (Signed)
Stable with no angina and good activity level.  Continue medical Rx  

## 2014-07-30 NOTE — Assessment & Plan Note (Signed)
Well controlled.  Continue current medications and low sodium Dash type diet.    

## 2014-07-30 NOTE — Patient Instructions (Signed)
Your physician wants you to follow-up in: YEAR WITH DR NISHAN  ECHO SAME  DAY   You will receive a reminder letter in the mail two months in advance. If you don't receive a letter, please call our office to schedule the follow-up appointment. Your physician recommends that you continue on your current medications as directed. Please refer to the Current Medication list given to you today. 

## 2014-07-30 NOTE — Assessment & Plan Note (Signed)
Cholesterol is at goal.  Continue current dose of statin and diet Rx.  No myalgias or side effects.  F/U  LFT's in 6 months. No results found for: LDLCALC           

## 2014-07-30 NOTE — Assessment & Plan Note (Signed)
F/U echo in a year  No change in murmur Asymptomatic

## 2014-08-10 DIAGNOSIS — H1031 Unspecified acute conjunctivitis, right eye: Secondary | ICD-10-CM | POA: Diagnosis not present

## 2014-08-20 DIAGNOSIS — H1031 Unspecified acute conjunctivitis, right eye: Secondary | ICD-10-CM | POA: Diagnosis not present

## 2014-09-18 DIAGNOSIS — H2512 Age-related nuclear cataract, left eye: Secondary | ICD-10-CM | POA: Diagnosis not present

## 2014-09-18 DIAGNOSIS — H25042 Posterior subcapsular polar age-related cataract, left eye: Secondary | ICD-10-CM | POA: Diagnosis not present

## 2014-09-18 DIAGNOSIS — H25032 Anterior subcapsular polar age-related cataract, left eye: Secondary | ICD-10-CM | POA: Diagnosis not present

## 2014-09-18 DIAGNOSIS — H25812 Combined forms of age-related cataract, left eye: Secondary | ICD-10-CM | POA: Diagnosis not present

## 2014-10-15 DIAGNOSIS — H6123 Impacted cerumen, bilateral: Secondary | ICD-10-CM | POA: Diagnosis not present

## 2014-10-15 DIAGNOSIS — H903 Sensorineural hearing loss, bilateral: Secondary | ICD-10-CM | POA: Diagnosis not present

## 2014-11-22 DIAGNOSIS — L570 Actinic keratosis: Secondary | ICD-10-CM | POA: Diagnosis not present

## 2014-11-22 DIAGNOSIS — Z85828 Personal history of other malignant neoplasm of skin: Secondary | ICD-10-CM | POA: Diagnosis not present

## 2014-11-22 DIAGNOSIS — D485 Neoplasm of uncertain behavior of skin: Secondary | ICD-10-CM | POA: Diagnosis not present

## 2014-11-22 DIAGNOSIS — L821 Other seborrheic keratosis: Secondary | ICD-10-CM | POA: Diagnosis not present

## 2014-11-22 DIAGNOSIS — C44319 Basal cell carcinoma of skin of other parts of face: Secondary | ICD-10-CM | POA: Diagnosis not present

## 2014-12-12 DIAGNOSIS — Z125 Encounter for screening for malignant neoplasm of prostate: Secondary | ICD-10-CM | POA: Diagnosis not present

## 2014-12-12 DIAGNOSIS — E785 Hyperlipidemia, unspecified: Secondary | ICD-10-CM | POA: Diagnosis not present

## 2014-12-12 DIAGNOSIS — I10 Essential (primary) hypertension: Secondary | ICD-10-CM | POA: Diagnosis not present

## 2014-12-12 DIAGNOSIS — N183 Chronic kidney disease, stage 3 (moderate): Secondary | ICD-10-CM | POA: Diagnosis not present

## 2014-12-14 DIAGNOSIS — Z6829 Body mass index (BMI) 29.0-29.9, adult: Secondary | ICD-10-CM | POA: Diagnosis not present

## 2014-12-14 DIAGNOSIS — Z1389 Encounter for screening for other disorder: Secondary | ICD-10-CM | POA: Diagnosis not present

## 2014-12-14 DIAGNOSIS — E669 Obesity, unspecified: Secondary | ICD-10-CM | POA: Diagnosis not present

## 2014-12-14 DIAGNOSIS — Z0000 Encounter for general adult medical examination without abnormal findings: Secondary | ICD-10-CM | POA: Diagnosis not present

## 2014-12-14 DIAGNOSIS — Z23 Encounter for immunization: Secondary | ICD-10-CM | POA: Diagnosis not present

## 2014-12-14 DIAGNOSIS — I129 Hypertensive chronic kidney disease with stage 1 through stage 4 chronic kidney disease, or unspecified chronic kidney disease: Secondary | ICD-10-CM | POA: Diagnosis not present

## 2014-12-14 DIAGNOSIS — N183 Chronic kidney disease, stage 3 (moderate): Secondary | ICD-10-CM | POA: Diagnosis not present

## 2014-12-14 DIAGNOSIS — I10 Essential (primary) hypertension: Secondary | ICD-10-CM | POA: Diagnosis not present

## 2014-12-14 DIAGNOSIS — I2510 Atherosclerotic heart disease of native coronary artery without angina pectoris: Secondary | ICD-10-CM | POA: Diagnosis not present

## 2014-12-14 DIAGNOSIS — E785 Hyperlipidemia, unspecified: Secondary | ICD-10-CM | POA: Diagnosis not present

## 2014-12-14 DIAGNOSIS — D692 Other nonthrombocytopenic purpura: Secondary | ICD-10-CM | POA: Diagnosis not present

## 2014-12-18 DIAGNOSIS — Z1212 Encounter for screening for malignant neoplasm of rectum: Secondary | ICD-10-CM | POA: Diagnosis not present

## 2014-12-19 DIAGNOSIS — D485 Neoplasm of uncertain behavior of skin: Secondary | ICD-10-CM | POA: Diagnosis not present

## 2014-12-19 DIAGNOSIS — C44712 Basal cell carcinoma of skin of right lower limb, including hip: Secondary | ICD-10-CM | POA: Diagnosis not present

## 2014-12-19 DIAGNOSIS — L570 Actinic keratosis: Secondary | ICD-10-CM | POA: Diagnosis not present

## 2014-12-25 DIAGNOSIS — Z1212 Encounter for screening for malignant neoplasm of rectum: Secondary | ICD-10-CM | POA: Diagnosis not present

## 2015-01-09 DIAGNOSIS — L570 Actinic keratosis: Secondary | ICD-10-CM | POA: Diagnosis not present

## 2015-01-14 ENCOUNTER — Encounter (HOSPITAL_COMMUNITY): Payer: Self-pay | Admitting: Emergency Medicine

## 2015-01-14 ENCOUNTER — Emergency Department (HOSPITAL_COMMUNITY)
Admission: EM | Admit: 2015-01-14 | Discharge: 2015-01-14 | Disposition: A | Discharge status: Home / Self Care | Payer: Medicare Other | Attending: Emergency Medicine | Admitting: Emergency Medicine

## 2015-01-14 DIAGNOSIS — Y998 Other external cause status: Secondary | ICD-10-CM | POA: Insufficient documentation

## 2015-01-14 DIAGNOSIS — Z8719 Personal history of other diseases of the digestive system: Secondary | ICD-10-CM | POA: Diagnosis not present

## 2015-01-14 DIAGNOSIS — Z86018 Personal history of other benign neoplasm: Secondary | ICD-10-CM | POA: Insufficient documentation

## 2015-01-14 DIAGNOSIS — I2510 Atherosclerotic heart disease of native coronary artery without angina pectoris: Secondary | ICD-10-CM | POA: Insufficient documentation

## 2015-01-14 DIAGNOSIS — S61217A Laceration without foreign body of left little finger without damage to nail, initial encounter: Secondary | ICD-10-CM | POA: Diagnosis not present

## 2015-01-14 DIAGNOSIS — E782 Mixed hyperlipidemia: Secondary | ICD-10-CM | POA: Insufficient documentation

## 2015-01-14 DIAGNOSIS — I10 Essential (primary) hypertension: Secondary | ICD-10-CM | POA: Diagnosis not present

## 2015-01-14 DIAGNOSIS — Z9889 Other specified postprocedural states: Secondary | ICD-10-CM | POA: Insufficient documentation

## 2015-01-14 DIAGNOSIS — Y288XXA Contact with other sharp object, undetermined intent, initial encounter: Secondary | ICD-10-CM | POA: Diagnosis not present

## 2015-01-14 DIAGNOSIS — Z8546 Personal history of malignant neoplasm of prostate: Secondary | ICD-10-CM | POA: Diagnosis not present

## 2015-01-14 DIAGNOSIS — Y9289 Other specified places as the place of occurrence of the external cause: Secondary | ICD-10-CM | POA: Insufficient documentation

## 2015-01-14 DIAGNOSIS — Z23 Encounter for immunization: Secondary | ICD-10-CM | POA: Diagnosis not present

## 2015-01-14 DIAGNOSIS — Y9389 Activity, other specified: Secondary | ICD-10-CM | POA: Insufficient documentation

## 2015-01-14 DIAGNOSIS — Z7902 Long term (current) use of antithrombotics/antiplatelets: Secondary | ICD-10-CM | POA: Insufficient documentation

## 2015-01-14 DIAGNOSIS — Z9861 Coronary angioplasty status: Secondary | ICD-10-CM | POA: Diagnosis not present

## 2015-01-14 DIAGNOSIS — Z7982 Long term (current) use of aspirin: Secondary | ICD-10-CM | POA: Insufficient documentation

## 2015-01-14 DIAGNOSIS — Z79899 Other long term (current) drug therapy: Secondary | ICD-10-CM | POA: Insufficient documentation

## 2015-01-14 DIAGNOSIS — S61219A Laceration without foreign body of unspecified finger without damage to nail, initial encounter: Secondary | ICD-10-CM

## 2015-01-14 MED ORDER — BUPIVACAINE HCL (PF) 0.5 % IJ SOLN
20.0000 mL | Freq: Once | INTRAMUSCULAR | Status: AC
  Administered 2015-01-14: 20 mL
  Filled 2015-01-14: qty 30

## 2015-01-14 MED ORDER — TETANUS-DIPHTH-ACELL PERTUSSIS 5-2.5-18.5 LF-MCG/0.5 IM SUSP
0.5000 mL | Freq: Once | INTRAMUSCULAR | Status: AC
  Administered 2015-01-14: 0.5 mL via INTRAMUSCULAR
  Filled 2015-01-14: qty 0.5

## 2015-01-14 NOTE — ED Notes (Signed)
Pt reported lt 5th finger lac with controlled bleeding s/p to cutting with razor blade. (+)PMS, CRT brisk, no deformity/bruising. Family at bedside.

## 2015-01-14 NOTE — ED Provider Notes (Signed)
CSN: [id number]     Arrival date & time 01/14/15  1726 History   First MD Initiated Contact with Patient 01/14/15 1807     Chief Complaint  Patient presents with  . finger laceration      (Consider location/radiation/quality/duration/timing/severity/associated sxs/prior Treatment) HPI [name] [name] is an 81 year old male with past medical history of hypertension, hyperlipidemia, CAD who presents the ER complaining of laceration to his finger. Patient reports using a razor blade to open a box with tape on it, slipped and the razor blade cut his anterior finger pad on the fifth digit of his left hand. Patient denies numbness, tingling, loss of sensation or function.  Past Medical History  Diagnosis Date  . HYPERTENSION, BENIGN   . HYPERLIPIDEMIA-MIXED   . CAD, NATIVE VESSEL     post stenting of the RCA in 06/2005 with a bare-metal stent with residual 50-70% narrowing inb the left anterior descending coronary artery   . PROSTATE CANCER, HX OF   . Diverticulosis   . Tubular adenoma   . Hemorrhoids   . Lumbar stenosis    Past Surgical History  Procedure Laterality Date  . Coronary stent placement      CAD post stenting of the RCA in 06/2005 with a bare-metal stent with residual 50-70% narrowing inb the left anterior descending coronary artery   . Prostatectomy    . Appendectomy    . Mohs surgery      x 2  . Cardiac catheterization     Family History  Problem Relation Age of Onset  . Heart disease Mother   . Colon cancer Neg Hx   . Esophageal cancer Neg Hx   . Stomach cancer Neg Hx   . Rectal cancer Neg Hx    History  Substance Use Topics  . Smoking status: Never Smoker   . Smokeless tobacco: Never Used  . Alcohol Use: No    Review of Systems  Constitutional: Negative for fever.  HENT: Negative for trouble swallowing.   Eyes: Negative for visual disturbance.  Respiratory: Negative for shortness of breath.   Cardiovascular: Negative for chest pain.  Gastrointestinal:  Negative for nausea, vomiting and abdominal pain.  Genitourinary: Negative for dysuria.  Musculoskeletal: Negative for neck pain.  Skin: Negative for rash.       Laceration  Neurological: Negative for dizziness, weakness and numbness.  Psychiatric/Behavioral: Negative.       Allergies  Review of patient's allergies indicates no known allergies.  Home Medications   Prior to Admission medications   Medication Sig Start Date End Date Taking? Authorizing Provider  amLODipine (NORVASC) 10 MG tablet Take 1 tablet (10 mg total) by mouth daily. 07/23/14  Yes Josue Hector, MD  aspirin 81 MG tablet Take 81 mg by mouth daily.    Yes Historical Provider, MD  clopidogrel (PLAVIX) 75 MG tablet Take 1 tablet (75 mg total) by mouth daily. 12/07/11  Yes Josue Hector, MD  fish oil-omega-3 fatty acids 1000 MG capsule Take 1 g by mouth daily.   Yes Historical Provider, MD  simvastatin (ZOCOR) 20 MG tablet Take 20 mg by mouth daily. 01/08/15  Yes Historical Provider, MD  triamterene-hydrochlorothiazide (MAXZIDE) 75-50 MG per tablet Take 1 tablet by mouth daily.   Yes Historical Provider, MD  benazepril (LOTENSIN) 20 MG tablet Take 1 tablet (20 mg total) by mouth daily. Patient not taking: Reported on 01/14/2015 08/15/12   Josue Hector, MD  nitroGLYCERIN (NITROSTAT) 0.4 MG SL tablet Place 1 tablet (  0.4 mg total) under the tongue every 5 (five) minutes as needed. Patient not taking: Reported on 01/14/2015 09/03/11   Josue Hector, MD   BP 157/86 mmHg  Pulse 87  Temp(Src) 98.6 F (37 C) (Oral)  Resp 16  SpO2 97% Physical Exam  Constitutional: He is oriented to person, place, and time. He appears well-developed and well-nourished. No distress.  HENT:  Head: Normocephalic and atraumatic.  Eyes: Right eye exhibits no discharge. Left eye exhibits no discharge. No scleral icterus.  Neck: Normal range of motion.  Pulmonary/Chest: Effort normal. No respiratory distress.  Musculoskeletal: Normal range of  motion.  Neurological: He is alert and oriented to person, place, and time.  Skin: Skin is warm and dry. He is not diaphoretic.  4 cm  laceration to the ulnar aspect of the fifth digit distal finger pad on the left hand. Radial pulse 2+. Capillary refill less than 2 seconds. Distal 1 cm of the Laceration appears to be deep to the subcutaneous tissues. The remaining laceration is superficial into the epidermis.   Psychiatric: He has a normal mood and affect.  Nursing note and vitals reviewed.   ED Course  LACERATION REPAIR Date/Time: 01/14/2015 8:08 PM Performed by: Dahlia Bailiff Authorized by: Dahlia Bailiff Consent: Verbal consent obtained. Risks and benefits: risks, benefits and alternatives were discussed Consent given by: patient Patient identity confirmed: verbally with patient Time out: Immediately prior to procedure a "time out" was called to verify the correct patient, procedure, equipment, support staff and site/side marked as required. Body area: upper extremity Location details: left small finger Laceration length: 4 cm Foreign bodies: no foreign bodies Tendon involvement: none Nerve involvement: none Vascular damage: no Anesthesia: digital block Local anesthetic: bupivacaine 0.5% without epinephrine Anesthetic total: 5 ml Patient sedated: no Preparation: Patient was prepped and draped in the usual sterile fashion. Irrigation solution: saline Irrigation method: jet lavage Amount of cleaning: standard Debridement: none Degree of undermining: none Skin closure: 6-0 Prolene Number of sutures: 4 Technique: simple Approximation: close Approximation difficulty: simple Dressing: 4x4 sterile gauze Patient tolerance: Patient tolerated the procedure well with no immediate complications   (including critical care time) Labs Review Labs Reviewed - No data to display  Imaging Review No results found.   EKG Interpretation None      MDM   Final diagnoses:  Finger  laceration, initial encounter    Tdap booster given. Wound cleaning complete with pressure irrigation, bottom of wound visualized, no foreign bodies appreciated. Laceration occurred < 8 hours prior to repair which was well tolerated. Pt has no co morbidities to effect normal wound healing. Discussed suture home care w pt and answered questions. Pt to f-u for wound check and suture removal in 7 days. Pt is hemodynamically stable w no complaints prior to dc. Discussed return precautions, patient verbalizes understanding and agreement of this plan.  BP 157/86 mmHg  Pulse 87  Temp(Src) 98.6 F (37 C) (Oral)  Resp 16  SpO2 97%  Signed,  Dahlia Bailiff, PA-C 8:10 PM  Patient seen and discussed with Dr. Tanna Furry, MD      Dahlia Bailiff, PA-C 01/14/15 2010  Tanna Furry, MD 01/23/15 1459

## 2015-01-14 NOTE — ED Notes (Signed)
Per pt, states he cut little finger with an old razor blade

## 2015-01-14 NOTE — Discharge Instructions (Signed)
Suture Removal, Care After Refer to this sheet in the next few weeks. These instructions provide you with information on caring for yourself after your procedure. Your health care provider may also give you more specific instructions. Your treatment has been planned according to current medical practices, but problems sometimes occur. Call your health care provider if you have any problems or questions after your procedure. WHAT TO EXPECT AFTER THE PROCEDURE After your stitches (sutures) are removed, it is typical to have the following:  Some discomfort and swelling in the wound area.  Slight redness in the area. HOME CARE INSTRUCTIONS   If you have skin adhesive strips over the wound area, do not take the strips off. They will fall off on their own in a few days. If the strips remain in place after 14 days, you may remove them.  Change any bandages (dressings) at least once a day or as directed by your health care provider. If the bandage sticks, soak it off with warm, soapy water.  Apply cream or ointment only as directed by your health care provider. If using cream or ointment, wash the area with soap and water 2 times a day to remove all the cream or ointment. Rinse off the soap and pat the area dry with a clean towel.  Keep the wound area dry and clean. If the bandage becomes wet or dirty, or if it develops a bad smell, change it as soon as possible.  Continue to protect the wound from injury.  Use sunscreen when out in the sun. New scars become sunburned easily. SEEK MEDICAL CARE IF:  You have increasing redness, swelling, or pain in the wound.  You see pus coming from the wound.  You have a fever.  You notice a bad smell coming from the wound or dressing.  Your wound breaks open (edges not staying together). Document Released: 05/05/2001 Document Revised: 05/31/2013 Document Reviewed: 03/22/2013 [facility] Patient Information 2015 [place], [place]. This information is not  intended to replace advice given to you by your health care provider. Make sure you discuss any questions you have with your health care provider.  Laceration Care, Adult A laceration is a cut or lesion that goes through all layers of the skin and into the tissue just beneath the skin. TREATMENT  Some lacerations may not require closure. Some lacerations may not be able to be closed due to an increased risk of infection. It is important to see your caregiver as soon as possible after an injury to minimize the risk of infection and maximize the opportunity for successful closure. If closure is appropriate, pain medicines may be given, if needed. The wound will be cleaned to help prevent infection. Your caregiver will use stitches (sutures), staples, wound glue (adhesive), or skin adhesive strips to repair the laceration. These tools bring the skin edges together to allow for faster healing and a better cosmetic outcome. However, all wounds will heal with a scar. Once the wound has healed, scarring can be minimized by covering the wound with sunscreen during the day for 1 full year. HOME CARE INSTRUCTIONS  For sutures or staples:  Keep the wound clean and dry.  If you were given a bandage (dressing), you should change it at least once a day. Also, change the dressing if it becomes wet or dirty, or as directed by your caregiver.  Wash the wound with soap and water 2 times a day. Rinse the wound off with water to remove all soap. [place] Din  the wound dry with a clean towel.  After cleaning, apply a thin layer of the antibiotic ointment as recommended by your caregiver. This will help prevent infection and keep the dressing from sticking.  You may shower as usual after the first 24 hours. Do not soak the wound in water until the sutures are removed.  Only take over-the-counter or prescription medicines for pain, discomfort, or fever as directed by your caregiver.  Get your sutures or staples removed as  directed by your caregiver. For skin adhesive strips:  Keep the wound clean and dry.  Do not get the skin adhesive strips wet. You may bathe carefully, using caution to keep the wound dry.  If the wound gets wet, pat it dry with a clean towel.  Skin adhesive strips will fall off on their own. You may trim the strips as the wound heals. Do not remove skin adhesive strips that are still stuck to the wound. They will fall off in time. For wound adhesive:  You may briefly wet your wound in the shower or bath. Do not soak or scrub the wound. Do not swim. Avoid periods of heavy perspiration until the skin adhesive has fallen off on its own. After showering or bathing, gently pat the wound dry with a clean towel.  Do not apply liquid medicine, cream medicine, or ointment medicine to your wound while the skin adhesive is in place. This may loosen the film before your wound is healed.  If a dressing is placed over the wound, be careful not to apply tape directly over the skin adhesive. This may cause the adhesive to be pulled off before the wound is healed.  Avoid prolonged exposure to sunlight or tanning lamps while the skin adhesive is in place. Exposure to ultraviolet light in the first year will darken the scar.  The skin adhesive will usually remain in place for 5 to 10 days, then naturally fall off the skin. Do not pick at the adhesive film. You may need a tetanus shot if:  You cannot remember when you had your last tetanus shot.  You have never had a tetanus shot. If you get a tetanus shot, your arm may swell, get red, and feel warm to the touch. This is common and not a problem. If you need a tetanus shot and you choose not to have one, there is a rare chance of getting tetanus. Sickness from tetanus can be serious. SEEK MEDICAL CARE IF:   You have redness, swelling, or increasing pain in the wound.  You see a red line that goes away from the wound.  You have yellowish-white fluid  (pus) coming from the wound.  You have a fever.  You notice a bad smell coming from the wound or dressing.  Your wound breaks open before or after sutures have been removed.  You notice something coming out of the wound such as wood or glass.  Your wound is on your hand or foot and you cannot move a finger or toe. SEEK IMMEDIATE MEDICAL CARE IF:   Your pain is not controlled with prescribed medicine.  You have severe swelling around the wound causing pain and numbness or a change in color in your arm, hand, leg, or foot.  Your wound splits open and starts bleeding.  You have worsening numbness, weakness, or loss of function of any joint around or beyond the wound.  You develop painful lumps near the wound or on the skin anywhere on your body.  MAKE SURE YOU:   Understand these instructions.  Will watch your condition.  Will get help right away if you are not doing well or get worse. Document Released: 08/10/2005 Document Revised: 11/02/2011 Document Reviewed: 02/03/2011 [facility] Patient Information 2015 [place], [place]. This information is not intended to replace advice given to you by your health care provider. Make sure you discuss any questions you have with your health care provider.

## 2015-01-22 DIAGNOSIS — Z6829 Body mass index (BMI) 29.0-29.9, adult: Secondary | ICD-10-CM | POA: Diagnosis not present

## 2015-01-22 DIAGNOSIS — Z4802 Encounter for removal of sutures: Secondary | ICD-10-CM | POA: Diagnosis not present

## 2015-01-22 DIAGNOSIS — S61207A Unspecified open wound of left little finger without damage to nail, initial encounter: Secondary | ICD-10-CM | POA: Diagnosis not present

## 2015-01-22 DIAGNOSIS — I10 Essential (primary) hypertension: Secondary | ICD-10-CM | POA: Diagnosis not present

## 2015-01-31 DIAGNOSIS — C44712 Basal cell carcinoma of skin of right lower limb, including hip: Secondary | ICD-10-CM | POA: Diagnosis not present

## 2015-02-11 DIAGNOSIS — C44319 Basal cell carcinoma of skin of other parts of face: Secondary | ICD-10-CM | POA: Diagnosis not present

## 2015-03-27 DIAGNOSIS — C44310 Basal cell carcinoma of skin of unspecified parts of face: Secondary | ICD-10-CM | POA: Diagnosis not present

## 2015-03-27 DIAGNOSIS — D485 Neoplasm of uncertain behavior of skin: Secondary | ICD-10-CM | POA: Diagnosis not present

## 2015-03-27 DIAGNOSIS — C44722 Squamous cell carcinoma of skin of right lower limb, including hip: Secondary | ICD-10-CM | POA: Diagnosis not present

## 2015-03-28 DIAGNOSIS — C44319 Basal cell carcinoma of skin of other parts of face: Secondary | ICD-10-CM | POA: Diagnosis not present

## 2015-03-28 DIAGNOSIS — C44712 Basal cell carcinoma of skin of right lower limb, including hip: Secondary | ICD-10-CM | POA: Diagnosis not present

## 2015-05-06 DIAGNOSIS — C44319 Basal cell carcinoma of skin of other parts of face: Secondary | ICD-10-CM | POA: Diagnosis not present

## 2015-05-10 DIAGNOSIS — C44712 Basal cell carcinoma of skin of right lower limb, including hip: Secondary | ICD-10-CM | POA: Diagnosis not present

## 2015-06-06 DIAGNOSIS — C44712 Basal cell carcinoma of skin of right lower limb, including hip: Secondary | ICD-10-CM | POA: Diagnosis not present

## 2015-06-06 DIAGNOSIS — L570 Actinic keratosis: Secondary | ICD-10-CM | POA: Diagnosis not present

## 2015-06-06 DIAGNOSIS — D225 Melanocytic nevi of trunk: Secondary | ICD-10-CM | POA: Diagnosis not present

## 2015-06-06 DIAGNOSIS — D485 Neoplasm of uncertain behavior of skin: Secondary | ICD-10-CM | POA: Diagnosis not present

## 2015-06-06 DIAGNOSIS — D1801 Hemangioma of skin and subcutaneous tissue: Secondary | ICD-10-CM | POA: Diagnosis not present

## 2015-06-06 DIAGNOSIS — C44612 Basal cell carcinoma of skin of right upper limb, including shoulder: Secondary | ICD-10-CM | POA: Diagnosis not present

## 2015-06-06 DIAGNOSIS — C44619 Basal cell carcinoma of skin of left upper limb, including shoulder: Secondary | ICD-10-CM | POA: Diagnosis not present

## 2015-06-06 DIAGNOSIS — C44519 Basal cell carcinoma of skin of other part of trunk: Secondary | ICD-10-CM | POA: Diagnosis not present

## 2015-06-06 DIAGNOSIS — L821 Other seborrheic keratosis: Secondary | ICD-10-CM | POA: Diagnosis not present

## 2015-06-20 DIAGNOSIS — I129 Hypertensive chronic kidney disease with stage 1 through stage 4 chronic kidney disease, or unspecified chronic kidney disease: Secondary | ICD-10-CM | POA: Diagnosis not present

## 2015-06-20 DIAGNOSIS — Z1389 Encounter for screening for other disorder: Secondary | ICD-10-CM | POA: Diagnosis not present

## 2015-06-20 DIAGNOSIS — Z6841 Body Mass Index (BMI) 40.0 and over, adult: Secondary | ICD-10-CM | POA: Diagnosis not present

## 2015-06-20 DIAGNOSIS — E785 Hyperlipidemia, unspecified: Secondary | ICD-10-CM | POA: Diagnosis not present

## 2015-06-20 DIAGNOSIS — I10 Essential (primary) hypertension: Secondary | ICD-10-CM | POA: Diagnosis not present

## 2015-06-20 DIAGNOSIS — N183 Chronic kidney disease, stage 3 (moderate): Secondary | ICD-10-CM | POA: Diagnosis not present

## 2015-06-20 DIAGNOSIS — I2510 Atherosclerotic heart disease of native coronary artery without angina pectoris: Secondary | ICD-10-CM | POA: Diagnosis not present

## 2015-06-20 DIAGNOSIS — E669 Obesity, unspecified: Secondary | ICD-10-CM | POA: Diagnosis not present

## 2015-07-11 DIAGNOSIS — L570 Actinic keratosis: Secondary | ICD-10-CM | POA: Diagnosis not present

## 2015-07-11 DIAGNOSIS — C44619 Basal cell carcinoma of skin of left upper limb, including shoulder: Secondary | ICD-10-CM | POA: Diagnosis not present

## 2015-07-11 DIAGNOSIS — C44519 Basal cell carcinoma of skin of other part of trunk: Secondary | ICD-10-CM | POA: Diagnosis not present

## 2015-07-11 DIAGNOSIS — C44712 Basal cell carcinoma of skin of right lower limb, including hip: Secondary | ICD-10-CM | POA: Diagnosis not present

## 2015-07-22 DIAGNOSIS — D044 Carcinoma in situ of skin of scalp and neck: Secondary | ICD-10-CM | POA: Diagnosis not present

## 2015-07-22 DIAGNOSIS — D485 Neoplasm of uncertain behavior of skin: Secondary | ICD-10-CM | POA: Diagnosis not present

## 2015-07-22 DIAGNOSIS — D0462 Carcinoma in situ of skin of left upper limb, including shoulder: Secondary | ICD-10-CM | POA: Diagnosis not present

## 2015-07-22 DIAGNOSIS — C44519 Basal cell carcinoma of skin of other part of trunk: Secondary | ICD-10-CM | POA: Diagnosis not present

## 2015-08-15 ENCOUNTER — Encounter: Payer: Self-pay | Admitting: Cardiovascular Disease

## 2015-09-02 ENCOUNTER — Ambulatory Visit: Payer: BLUE CROSS/BLUE SHIELD | Admitting: Cardiovascular Disease

## 2015-09-02 ENCOUNTER — Other Ambulatory Visit (HOSPITAL_COMMUNITY): Payer: BLUE CROSS/BLUE SHIELD

## 2015-09-05 DIAGNOSIS — D1801 Hemangioma of skin and subcutaneous tissue: Secondary | ICD-10-CM | POA: Diagnosis not present

## 2015-09-05 DIAGNOSIS — L821 Other seborrheic keratosis: Secondary | ICD-10-CM | POA: Diagnosis not present

## 2015-09-05 DIAGNOSIS — C44719 Basal cell carcinoma of skin of left lower limb, including hip: Secondary | ICD-10-CM | POA: Diagnosis not present

## 2015-09-05 DIAGNOSIS — Z85828 Personal history of other malignant neoplasm of skin: Secondary | ICD-10-CM | POA: Diagnosis not present

## 2015-09-05 DIAGNOSIS — L739 Follicular disorder, unspecified: Secondary | ICD-10-CM | POA: Diagnosis not present

## 2015-09-05 DIAGNOSIS — C4400 Unspecified malignant neoplasm of skin of lip: Secondary | ICD-10-CM | POA: Diagnosis not present

## 2015-09-05 DIAGNOSIS — L820 Inflamed seborrheic keratosis: Secondary | ICD-10-CM | POA: Diagnosis not present

## 2015-09-05 DIAGNOSIS — D045 Carcinoma in situ of skin of trunk: Secondary | ICD-10-CM | POA: Diagnosis not present

## 2015-09-05 DIAGNOSIS — L905 Scar conditions and fibrosis of skin: Secondary | ICD-10-CM | POA: Diagnosis not present

## 2015-09-05 DIAGNOSIS — D485 Neoplasm of uncertain behavior of skin: Secondary | ICD-10-CM | POA: Diagnosis not present

## 2015-09-05 DIAGNOSIS — L570 Actinic keratosis: Secondary | ICD-10-CM | POA: Diagnosis not present

## 2015-09-05 DIAGNOSIS — D0471 Carcinoma in situ of skin of right lower limb, including hip: Secondary | ICD-10-CM | POA: Diagnosis not present

## 2015-09-05 DIAGNOSIS — D0472 Carcinoma in situ of skin of left lower limb, including hip: Secondary | ICD-10-CM | POA: Diagnosis not present

## 2015-09-05 NOTE — Progress Notes (Signed)
Cardiology Office Note   Date:  09/06/2015   ID:  ABEN MANSOOR, DOB Jan 18, 1932, MRN [id number]  PCP:  Geoffery Lyons, MD  Cardiologist:  Dr. Johnsie Cancel     Chief Complaint  Patient presents with  . Coronary Artery Disease    no pain, no SOB      History of Present Illness: [name] [name] is a 82 y.o. male who presents for CAD. Cath in 2006 with with BMS to LAD.  This was done after neck pain with mowing and + nuc stress test.   2006 he had a bare-metal stent to the RCA and had residual 50-70% narrowing in the LAD. He has good LV function.  He has done quite well over the past year with no chest pain, shortness of breath or palpitations. He works at the [employer] and is outside in hot weather much of the time and has had no symptoms related to this. He walks 2 miles 3 days a week.   Reviewed echo done 10/15 Normal EF and mild AS  - Left ventricle: The cavity size was normal. Wall thickness was increased in a pattern of mild LVH. Systolic function was normal. The estimated ejection fraction was in the range of 55% to 60%. Wall motion was normal; there were no regional wall motion abnormalities. Doppler parameters are consistent with abnormal left ventricular relaxation (grade 1 diastolic dysfunction). - Aortic valve: Trileaflet; moderately calcified leaflets. There was mild stenosis. There was trivial regurgitation. Mean gradient (S): 19 mm Hg. Peak gradient (S): 33 mm Hg. Valve area (VTI): 1.78 cm^2. - Mitral valve: Mildly calcified annulus. There was trivial regurgitation. - Left atrium: The atrium was mildly dilated. - Right ventricle: The cavity size was normal. Systolic function was normal. - Tricuspid valve: Peak RV-RA gradient (S): 23 mm Hg. - Pulmonary arteries: PA peak pressure: 26 mm Hg (S). - Inferior vena cava: The vessel was normal in size. The respirophasic diameter changes were in the normal range (>= 50%), consistent with normal  central venous pressure.  Impressions:  - Normal LV size with mild LV hypertrophy. EF 55-60%. Mild aortic stenosis. Normal RV size and systolic function.   Today: no complaints, no chest pain and no SOB.  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  He and his wife bowl as well.  Dr. Reynaldo Minium follows his lipids, last check was excellent. Will gt a copy of labs.     Past Medical History  Diagnosis Date  . HYPERTENSION, BENIGN   . HYPERLIPIDEMIA-MIXED   . CAD, NATIVE VESSEL     post stenting of the RCA in 06/2005 with a bare-metal stent with residual 50-70% narrowing inb the left anterior descending coronary artery   . PROSTATE CANCER, HX OF   . Diverticulosis   . Tubular adenoma   . Hemorrhoids   . Lumbar stenosis     Past Surgical History  Procedure Laterality Date  . Coronary stent placement      CAD post stenting of the RCA in 06/2005 with a bare-metal stent with residual 50-70% narrowing inb the left anterior descending coronary artery   . Prostatectomy    . Appendectomy    . Mohs surgery      x 2  . Cardiac catheterization       Current Outpatient Prescriptions  Medication Sig Dispense Refill  . amLODipine (NORVASC) 10 MG tablet Take 1 tablet (10 mg total) by mouth daily. 90 tablet  4  . aspirin 81 MG tablet Take 81 mg by mouth daily.     . benazepril (LOTENSIN) 20 MG tablet Take 1 tablet (20 mg total) by mouth daily. 90 tablet 4  . clopidogrel (PLAVIX) 75 MG tablet Take 1 tablet (75 mg total) by mouth daily. 30 tablet 11  . fish oil-omega-3 fatty acids 1000 MG capsule Take 1 g by mouth daily.    . nitroGLYCERIN (NITROSTAT) 0.4 MG SL tablet Place 1 tablet (0.4 mg total) under the tongue every 5 (five) minutes as needed. 25 tablet 3  . simvastatin (ZOCOR) 20 MG tablet Take 20 mg by mouth daily.    Marland Kitchen triamterene-hydrochlorothiazide (MAXZIDE) 75-50 MG per tablet Take 1 tablet by mouth daily.     No current  facility-administered medications for this visit.    Allergies:   Review of patient's allergies indicates no known allergies.    Social History:  The patient  reports that he has never smoked. He has never used smokeless tobacco. He reports that he does not drink alcohol or use illicit drugs.   Family History:  The patient's family history includes Heart disease in his mother; Hypertension in his mother. There is no history of Colon cancer, Esophageal cancer, Stomach cancer, Rectal cancer, Heart attack, or Stroke.    ROS:  General:no colds or fevers, no weight changes Skin:no rashes or ulcers- has had precancerous lesions removed from face and scalp.   HEENT:no blurred vision, no congestion CV:see HPI PUL:see HPI GI:no diarrhea constipation or melena, no indigestion GU:no hematuria, no dysuria MS:no joint pain, no claudication Neuro:no syncope, no lightheadedness Endo:no diabetes, no thyroid disease  Wt Readings from Last 3 Encounters:  09/06/15 209 lb (94.802 kg)  07/30/14 209 lb 6.4 oz (94.983 kg)  06/11/14 211 lb 12.8 oz (96.072 kg)     PHYSICAL EXAM: VS:  BP 140/76 mmHg  Pulse 74  Ht 5\' 10"  (1.778 m)  Wt 209 lb (94.802 kg)  BMI 29.99 kg/m2 , BMI Body mass index is 29.99 kg/(m^2). General:Pleasant affect, NAD Skin:Warm and dry, brisk capillary refill HEENT:normocephalic, sclera clear, mucus membranes moist Neck:supple, no JVD, no bruits  Heart:S1S2 RRR without murmur, gallup, rub or click Lungs:clear without rales, rhonchi, or wheezes [id number], non tender, + BS, do not palpate liver spleen or masses Ext:no lower ext edema, 2+ pedal pulses, 2+ radial pulses Neuro:alert and oriented X 3, MAE, follows commands, + facial symmetry    EKG:  EKG is ordered today. The ekg ordered today demonstrates SB with 1st degree AV block. LAD but no changes from 05/2014 except PR slightly longer at 290 ms.    Recent Labs: No results found for requested labs within last 365 days.     Lipid Panel No results found for: CHOL, TRIG, HDL, CHOLHDL, VLDL, LDLCALC, LDLDIRECT     Other studies Reviewed: Additional studies/ records that were reviewed today include: will get copy of lipids from Dr. Reynaldo Minium. .  Cardiac cath 2006: THE LEFT MAIN CORONARY ARTERY was free of significant disease.  THE LEFT ANTERIOR DESCENDING ARTERY had heavy calcification proximally. The LAD gave rise to 2 sepal perforators, a small diagonal branch, and then a moderate diagonal branch. There was a moderately long area of segmental disease in the proximal LAD of 50 to 70% with calcification. There was also 50% ostial stenosis in the first small diagonal branch.  THE CIRCUMFLEX ARTERY gave rise to a small marginal branch, a large marginal branch, and a posterolateral branch. These vessels  were irregular, and there was no significant obstruction.  THE RIGHT CORONARY ARTERY gave rise to a conus, had a 95% proximal stenosis and a 50% proximal stenosis with TIMI-2 flow distally. The right coronary artery gave rise to a right ventricular branch, a posterior descending branch, and 4 posterolateral branches. The distal right coronary artery also filled faintly by collaterals from the left coronary artery.  THE LEFT VENTRICULOGRAM performed in the RAO projection showed hypokinesis of the anterolateral wall and apex. The inferior wall moved well. The estimated ejection fraction was 45%.  CONCLUSION: Coronary artery disease with 50 to 70% stenosis in the proximal left anterior descending, no significant obstruction in the circumflex artery, 95% proximal stenosis in the right coronary artery, and anterolateral wall and apical wall hypokinesis with estimated ejection fraction of 45%.   RESULTS: Initially the stenosis in the proximal LAD was estimated at 95%, and there was a tandem lesion which was about 50%. The distal flow was TIMI- 2 flow. Following  stenting, the two stenoses improved to 0%, and the flow improved to TIMI-3 flow. There was residual 70% stenosis in the mid portion of the posterior descending branch.  CONCLUSION: Successful percutaneous coronary intervention of the lesion in the proximal left anterior descending using a bare metal stent with improvement in narrowing from 95% to 0% and improvement in flow from TIMI-2 to TIMI-3 flow.   ASSESSMENT AND PLAN:  1.  CAD continue medication- hx PCI with BMS to LAD 2006.  No chest pain or SOB Follow up with Dr. Johnsie Cancel in 1 year unless problems occur he will call.  2. HTN-essential stable.   3  Hyperlipidemia-followed by Dr. Reynaldo Minium- will ask for a copy.    Current medicines are reviewed with the patient today.  The patient Has no concerns regarding medicines.  The following changes have been made:  See above Labs/ tests ordered today include:see above  Disposition:   FU:  see above  Signed, Isaiah Serge, NP  09/06/2015 9:22 AM    [facility] [place], [place], [place], [place] Phone: [phone]; Fax: [phone]

## 2015-09-06 ENCOUNTER — Encounter: Payer: Self-pay | Admitting: Cardiology

## 2015-09-06 ENCOUNTER — Ambulatory Visit (INDEPENDENT_AMBULATORY_CARE_PROVIDER_SITE_OTHER): Payer: Medicare Other | Admitting: Cardiology

## 2015-09-06 VITALS — BP 140/76 | HR 74 | Ht 70.0 in | Wt 209.0 lb

## 2015-09-06 DIAGNOSIS — I350 Nonrheumatic aortic (valve) stenosis: Secondary | ICD-10-CM | POA: Diagnosis not present

## 2015-09-06 DIAGNOSIS — I2510 Atherosclerotic heart disease of native coronary artery without angina pectoris: Secondary | ICD-10-CM

## 2015-09-06 DIAGNOSIS — I10 Essential (primary) hypertension: Secondary | ICD-10-CM

## 2015-09-06 DIAGNOSIS — E785 Hyperlipidemia, unspecified: Secondary | ICD-10-CM | POA: Diagnosis not present

## 2015-09-06 MED ORDER — NITROGLYCERIN 0.4 MG SL SUBL: 0.4000 mg | SUBLINGUAL_TABLET | SUBLINGUAL | Status: AC | PRN

## 2015-09-06 NOTE — Patient Instructions (Addendum)
Medication Instructions:  Your physician recommends that you continue on your current medications as directed. Please refer to the Current Medication list given to you today.   Labwork: None ordered  Testing/Procedures: None ordered  Follow-Up: Your physician wants you to follow-up in: 1 YEAR WITH DR. NISHAN   You will receive a reminder letter in the mail two months in advance. If you don't receive a letter, please call our office to schedule the follow-up appointment.   Any Other Special Instructions Will Be Listed Below (If Applicable).     If you need a refill on your cardiac medications before your next appointment, please call your pharmacy.   

## 2015-09-09 ENCOUNTER — Ambulatory Visit (HOSPITAL_COMMUNITY): Payer: Medicare Other

## 2015-09-09 ENCOUNTER — Other Ambulatory Visit: Payer: Self-pay | Admitting: Cardiovascular Disease

## 2015-09-09 DIAGNOSIS — I350 Nonrheumatic aortic (valve) stenosis: Secondary | ICD-10-CM

## 2015-09-16 DIAGNOSIS — D044 Carcinoma in situ of skin of scalp and neck: Secondary | ICD-10-CM | POA: Diagnosis not present

## 2015-09-16 DIAGNOSIS — C44311 Basal cell carcinoma of skin of nose: Secondary | ICD-10-CM | POA: Diagnosis not present

## 2015-09-23 ENCOUNTER — Encounter: Payer: Self-pay | Admitting: Cardiology

## 2015-10-10 DIAGNOSIS — D045 Carcinoma in situ of skin of trunk: Secondary | ICD-10-CM | POA: Diagnosis not present

## 2015-10-10 DIAGNOSIS — D0472 Carcinoma in situ of skin of left lower limb, including hip: Secondary | ICD-10-CM | POA: Diagnosis not present

## 2015-10-10 DIAGNOSIS — D0471 Carcinoma in situ of skin of right lower limb, including hip: Secondary | ICD-10-CM | POA: Diagnosis not present

## 2015-10-10 DIAGNOSIS — L570 Actinic keratosis: Secondary | ICD-10-CM | POA: Diagnosis not present

## 2015-10-10 DIAGNOSIS — C44719 Basal cell carcinoma of skin of left lower limb, including hip: Secondary | ICD-10-CM | POA: Diagnosis not present

## 2015-10-10 DIAGNOSIS — C44712 Basal cell carcinoma of skin of right lower limb, including hip: Secondary | ICD-10-CM | POA: Diagnosis not present

## 2015-11-11 DIAGNOSIS — Z961 Presence of intraocular lens: Secondary | ICD-10-CM | POA: Diagnosis not present

## 2015-11-11 DIAGNOSIS — H52203 Unspecified astigmatism, bilateral: Secondary | ICD-10-CM | POA: Diagnosis not present

## 2015-11-12 DIAGNOSIS — H903 Sensorineural hearing loss, bilateral: Secondary | ICD-10-CM | POA: Diagnosis not present

## 2015-11-12 DIAGNOSIS — H6123 Impacted cerumen, bilateral: Secondary | ICD-10-CM | POA: Diagnosis not present

## 2015-12-16 DIAGNOSIS — Z125 Encounter for screening for malignant neoplasm of prostate: Secondary | ICD-10-CM | POA: Diagnosis not present

## 2015-12-16 DIAGNOSIS — I10 Essential (primary) hypertension: Secondary | ICD-10-CM | POA: Diagnosis not present

## 2015-12-16 DIAGNOSIS — E784 Other hyperlipidemia: Secondary | ICD-10-CM | POA: Diagnosis not present

## 2015-12-23 DIAGNOSIS — Z1212 Encounter for screening for malignant neoplasm of rectum: Secondary | ICD-10-CM | POA: Diagnosis not present

## 2016-01-01 DIAGNOSIS — K921 Melena: Secondary | ICD-10-CM | POA: Diagnosis not present

## 2016-01-02 DIAGNOSIS — L821 Other seborrheic keratosis: Secondary | ICD-10-CM | POA: Diagnosis not present

## 2016-01-02 DIAGNOSIS — D225 Melanocytic nevi of trunk: Secondary | ICD-10-CM | POA: Diagnosis not present

## 2016-01-02 DIAGNOSIS — L570 Actinic keratosis: Secondary | ICD-10-CM | POA: Diagnosis not present

## 2016-01-02 DIAGNOSIS — C44712 Basal cell carcinoma of skin of right lower limb, including hip: Secondary | ICD-10-CM | POA: Diagnosis not present

## 2016-01-02 DIAGNOSIS — L820 Inflamed seborrheic keratosis: Secondary | ICD-10-CM | POA: Diagnosis not present

## 2016-01-02 DIAGNOSIS — D485 Neoplasm of uncertain behavior of skin: Secondary | ICD-10-CM | POA: Diagnosis not present

## 2016-01-02 DIAGNOSIS — Z85828 Personal history of other malignant neoplasm of skin: Secondary | ICD-10-CM | POA: Diagnosis not present

## 2016-01-02 DIAGNOSIS — L812 Freckles: Secondary | ICD-10-CM | POA: Diagnosis not present

## 2016-01-02 DIAGNOSIS — C4441 Basal cell carcinoma of skin of scalp and neck: Secondary | ICD-10-CM | POA: Diagnosis not present

## 2016-01-02 DIAGNOSIS — C44519 Basal cell carcinoma of skin of other part of trunk: Secondary | ICD-10-CM | POA: Diagnosis not present

## 2016-01-03 DIAGNOSIS — Z0000 Encounter for general adult medical examination without abnormal findings: Secondary | ICD-10-CM | POA: Diagnosis not present

## 2016-01-03 DIAGNOSIS — D692 Other nonthrombocytopenic purpura: Secondary | ICD-10-CM | POA: Diagnosis not present

## 2016-01-03 DIAGNOSIS — N183 Chronic kidney disease, stage 3 (moderate): Secondary | ICD-10-CM | POA: Diagnosis not present

## 2016-01-03 DIAGNOSIS — I129 Hypertensive chronic kidney disease with stage 1 through stage 4 chronic kidney disease, or unspecified chronic kidney disease: Secondary | ICD-10-CM | POA: Diagnosis not present

## 2016-01-03 DIAGNOSIS — E784 Other hyperlipidemia: Secondary | ICD-10-CM | POA: Diagnosis not present

## 2016-01-03 DIAGNOSIS — I10 Essential (primary) hypertension: Secondary | ICD-10-CM | POA: Diagnosis not present

## 2016-01-03 DIAGNOSIS — Z6830 Body mass index (BMI) 30.0-30.9, adult: Secondary | ICD-10-CM | POA: Diagnosis not present

## 2016-01-03 DIAGNOSIS — Z1389 Encounter for screening for other disorder: Secondary | ICD-10-CM | POA: Diagnosis not present

## 2016-01-03 DIAGNOSIS — I2510 Atherosclerotic heart disease of native coronary artery without angina pectoris: Secondary | ICD-10-CM | POA: Diagnosis not present

## 2016-01-03 DIAGNOSIS — G250 Essential tremor: Secondary | ICD-10-CM | POA: Diagnosis not present

## 2016-01-03 DIAGNOSIS — R195 Other fecal abnormalities: Secondary | ICD-10-CM | POA: Diagnosis not present

## 2016-01-21 ENCOUNTER — Ambulatory Visit (INDEPENDENT_AMBULATORY_CARE_PROVIDER_SITE_OTHER): Payer: Medicare Other | Admitting: Gastroenterology

## 2016-01-21 ENCOUNTER — Encounter: Payer: Self-pay | Admitting: Gastroenterology

## 2016-01-21 VITALS — BP 120/70 | HR 80 | Ht 70.0 in | Wt 203.0 lb

## 2016-01-21 DIAGNOSIS — Z86010 Personal history of colonic polyps: Secondary | ICD-10-CM

## 2016-01-21 DIAGNOSIS — I2510 Atherosclerotic heart disease of native coronary artery without angina pectoris: Secondary | ICD-10-CM | POA: Diagnosis not present

## 2016-01-21 DIAGNOSIS — K648 Other hemorrhoids: Secondary | ICD-10-CM | POA: Diagnosis not present

## 2016-01-21 DIAGNOSIS — R195 Other fecal abnormalities: Secondary | ICD-10-CM

## 2016-01-21 DIAGNOSIS — K625 Hemorrhage of anus and rectum: Secondary | ICD-10-CM | POA: Diagnosis not present

## 2016-01-21 NOTE — Patient Instructions (Signed)
If you are age 80 or older, your body mass index should be between 23-30. Your Body mass index is 29.13 kg/(m^2). If this is out of the aforementioned range listed, please consider follow up with your Primary Care Provider.  If you are age 44 or younger, your body mass index should be between 19-25. Your Body mass index is 29.13 kg/(m^2). If this is out of the aformentioned range listed, please consider follow up with your Primary Care Provider.   Thank you for choosing [facility]  Dr Wilfrid Lund III

## 2016-01-21 NOTE — Progress Notes (Signed)
[facility] Progress Note  Chief Complaint: heme positive stool  Subjective History: Mr. Edenfield was sent by his PCP for evaluation due to recent rectal bleeding and then heme positive stool. About 3 weeks ago he had a single episode of painless rectal bleeding and none since then. He has no abdominal pain, change in bowel habits, nausea, vomiting, early satiety or weight loss. He saw his PCP, and iFOBT test was positive and hemoglobin normal at 13.5 Charlie's last colonoscopy in July 2015 with Dr. Olevia Perches revealed left-sided diverticulosis, 2 tubular adenomatous polyps and moderate sized internal hemorrhoids seen on the retroflexion photo.   ROS: Cardiovascular:  no chest pain Respiratory: no dyspnea  The patient's Past Medical, Family and Social History were reviewed and are on file in the EMR.  Objective:  Med list reviewed  Vital signs in last 24 hrs: Filed Vitals:   01/21/16 1554  BP: 120/70  Pulse: 80    Physical Exam   HEENT: sclera anicteric, oral mucosa moist without lesions  Neck: supple, no thyromegaly, JVD or lymphadenopathy  Cardiac: RRR without murmurs, S1S2 heard, no peripheral edema  Pulm: clear to auscultation bilaterally, normal RR and effort noted  Abdomen: soft, No tenderness, with active bowel sounds. No guarding or palpable hepatosplenomegaly.  Skin; warm and dry, no jaundice or rash Rectal: Normal sphincter tone, no palpable internal lesions  Recent Labs:  Recent normal CBC as noted above     @ASSESSMENTPLANBEGIN @ Assessment: Encounter Diagnoses  Name Primary?  . Rectal bleeding Yes  . Internal hemorrhoids   . Heme positive stool   . History of colonic polyps    This appears to have been a single episode of hemorrhoidal bleeding.  It is difficult to know what to make of a positive iFOBT in a patient his age with no symptoms and recent normal hemoglobin and just 2 small polyps on colonoscopy less than 2 years ago.  I am doubtful that as a  neoplastic cause for heme positive stool.  Plan: I reassured him, I have asked him to follow up with his PCP had his hemoglobin checked in a few months and let us know something changes. If he has frequent rectal bleeding, he might be a good candidate for hemorrhoidal banding. I would not pursue it after a single episode of bleeding in a man his age on antiplatelet therapy.   Nelida Meuse III  CC: Burnard Bunting, M.D.

## 2016-01-27 DIAGNOSIS — M79641 Pain in right hand: Secondary | ICD-10-CM | POA: Diagnosis not present

## 2016-01-27 DIAGNOSIS — M65321 Trigger finger, right index finger: Secondary | ICD-10-CM | POA: Diagnosis not present

## 2016-02-10 DIAGNOSIS — C4441 Basal cell carcinoma of skin of scalp and neck: Secondary | ICD-10-CM | POA: Diagnosis not present

## 2016-02-13 DIAGNOSIS — C44619 Basal cell carcinoma of skin of left upper limb, including shoulder: Secondary | ICD-10-CM | POA: Diagnosis not present

## 2016-02-13 DIAGNOSIS — C44712 Basal cell carcinoma of skin of right lower limb, including hip: Secondary | ICD-10-CM | POA: Diagnosis not present

## 2016-02-13 DIAGNOSIS — L570 Actinic keratosis: Secondary | ICD-10-CM | POA: Diagnosis not present

## 2016-02-13 DIAGNOSIS — C44519 Basal cell carcinoma of skin of other part of trunk: Secondary | ICD-10-CM | POA: Diagnosis not present

## 2016-02-20 DIAGNOSIS — I8392 Asymptomatic varicose veins of left lower extremity: Secondary | ICD-10-CM | POA: Diagnosis not present

## 2016-02-20 DIAGNOSIS — L853 Xerosis cutis: Secondary | ICD-10-CM | POA: Diagnosis not present

## 2016-03-13 DIAGNOSIS — M65321 Trigger finger, right index finger: Secondary | ICD-10-CM | POA: Diagnosis not present

## 2016-03-13 DIAGNOSIS — M19042 Primary osteoarthritis, left hand: Secondary | ICD-10-CM | POA: Diagnosis not present

## 2016-03-13 DIAGNOSIS — M1812 Unilateral primary osteoarthritis of first carpometacarpal joint, left hand: Secondary | ICD-10-CM | POA: Diagnosis not present

## 2016-03-13 DIAGNOSIS — M79642 Pain in left hand: Secondary | ICD-10-CM | POA: Diagnosis not present

## 2016-03-18 DIAGNOSIS — M7989 Other specified soft tissue disorders: Secondary | ICD-10-CM | POA: Diagnosis not present

## 2016-03-18 DIAGNOSIS — M65321 Trigger finger, right index finger: Secondary | ICD-10-CM | POA: Diagnosis not present

## 2016-03-18 DIAGNOSIS — M19042 Primary osteoarthritis, left hand: Secondary | ICD-10-CM | POA: Diagnosis not present

## 2016-04-06 DIAGNOSIS — M7989 Other specified soft tissue disorders: Secondary | ICD-10-CM | POA: Diagnosis not present

## 2016-04-06 DIAGNOSIS — M19042 Primary osteoarthritis, left hand: Secondary | ICD-10-CM | POA: Diagnosis not present

## 2016-04-15 DIAGNOSIS — D045 Carcinoma in situ of skin of trunk: Secondary | ICD-10-CM | POA: Diagnosis not present

## 2016-04-15 DIAGNOSIS — D1801 Hemangioma of skin and subcutaneous tissue: Secondary | ICD-10-CM | POA: Diagnosis not present

## 2016-04-15 DIAGNOSIS — C4442 Squamous cell carcinoma of skin of scalp and neck: Secondary | ICD-10-CM | POA: Diagnosis not present

## 2016-04-15 DIAGNOSIS — L814 Other melanin hyperpigmentation: Secondary | ICD-10-CM | POA: Diagnosis not present

## 2016-04-15 DIAGNOSIS — D0422 Carcinoma in situ of skin of left ear and external auricular canal: Secondary | ICD-10-CM | POA: Diagnosis not present

## 2016-04-15 DIAGNOSIS — L821 Other seborrheic keratosis: Secondary | ICD-10-CM | POA: Diagnosis not present

## 2016-04-15 DIAGNOSIS — L570 Actinic keratosis: Secondary | ICD-10-CM | POA: Diagnosis not present

## 2016-04-15 DIAGNOSIS — D485 Neoplasm of uncertain behavior of skin: Secondary | ICD-10-CM | POA: Diagnosis not present

## 2016-04-17 ENCOUNTER — Telehealth: Payer: Self-pay

## 2016-04-17 ENCOUNTER — Telehealth: Payer: Self-pay | Admitting: Cardiovascular Disease

## 2016-04-17 DIAGNOSIS — M7989 Other specified soft tissue disorders: Secondary | ICD-10-CM | POA: Diagnosis not present

## 2016-04-17 NOTE — Telephone Encounter (Signed)
Left message for patient to call back  

## 2016-04-17 NOTE — Telephone Encounter (Signed)
Patient injured his left hand recently. Patient has been seeing Dr. Fredna Dow, a hand surgeon. Dr. Fredna Dow started patient on meloxican for the swelling and bruising in patient's hand. Patient also has a compression glove on for swelling. Patient has no active bleeding from his left hand. Patient's swelling in his hand has gone down, but the bruising has increased. Patient stated that his hand is feeling better. Dr. Fredna Dow stopped patient's Meloxican, and suggested patient follow up with Dr. Johnsie Cancel. Patient stated that Dr. Fredna Dow wants him to ask Dr. Johnsie Cancel about changing from Plavix to Celebrex. Informed patient that these are two different medications, and that he is on Plavix due to his cardiac stent. Informed patient that sometimes Plavix is held for surgeries or injuries. Will Consult Cecilie Kicks NP.  Patient verbalized understanding and will seek medical attention if swelling comes back or if he has active bleeding from his hand.  Consulted Cecilie Kicks NP to see if he can hold his plavix for a few days while his hand heals. She stated he could hold his Plavix up to 3 to 4 days.   Left message for patient to call back.

## 2016-04-17 NOTE — Telephone Encounter (Signed)
Walk in pt form-questions about bruising-please call-gave to Pam P

## 2016-04-17 NOTE — Telephone Encounter (Signed)
F/u ° ° °Pt returning nurse call. Please call.  °

## 2016-04-20 DIAGNOSIS — H6123 Impacted cerumen, bilateral: Secondary | ICD-10-CM | POA: Diagnosis not present

## 2016-04-20 DIAGNOSIS — H903 Sensorineural hearing loss, bilateral: Secondary | ICD-10-CM | POA: Diagnosis not present

## 2016-04-20 NOTE — Telephone Encounter (Signed)
Called patient to make sure he got my message on Friday. Patient held his Plavix for two days, and started back last night. Patient states his hand is doing better. Patient will call with any other questions are concerns.

## 2016-04-29 DIAGNOSIS — M7989 Other specified soft tissue disorders: Secondary | ICD-10-CM | POA: Diagnosis not present

## 2016-04-29 DIAGNOSIS — M19042 Primary osteoarthritis, left hand: Secondary | ICD-10-CM | POA: Diagnosis not present

## 2016-05-11 DIAGNOSIS — Z6830 Body mass index (BMI) 30.0-30.9, adult: Secondary | ICD-10-CM | POA: Diagnosis not present

## 2016-05-11 DIAGNOSIS — M7989 Other specified soft tissue disorders: Secondary | ICD-10-CM | POA: Diagnosis not present

## 2016-05-12 DIAGNOSIS — H10411 Chronic giant papillary conjunctivitis, right eye: Secondary | ICD-10-CM | POA: Diagnosis not present

## 2016-05-13 DIAGNOSIS — M7989 Other specified soft tissue disorders: Secondary | ICD-10-CM | POA: Diagnosis not present

## 2016-05-13 DIAGNOSIS — Z6830 Body mass index (BMI) 30.0-30.9, adult: Secondary | ICD-10-CM | POA: Diagnosis not present

## 2016-05-21 DIAGNOSIS — H1131 Conjunctival hemorrhage, right eye: Secondary | ICD-10-CM | POA: Diagnosis not present

## 2016-05-28 DIAGNOSIS — F39 Unspecified mood [affective] disorder: Secondary | ICD-10-CM | POA: Diagnosis not present

## 2016-05-28 DIAGNOSIS — I10 Essential (primary) hypertension: Secondary | ICD-10-CM | POA: Diagnosis not present

## 2016-05-28 DIAGNOSIS — Z6829 Body mass index (BMI) 29.0-29.9, adult: Secondary | ICD-10-CM | POA: Diagnosis not present

## 2016-05-28 DIAGNOSIS — I2510 Atherosclerotic heart disease of native coronary artery without angina pectoris: Secondary | ICD-10-CM | POA: Diagnosis not present

## 2016-05-28 DIAGNOSIS — I129 Hypertensive chronic kidney disease with stage 1 through stage 4 chronic kidney disease, or unspecified chronic kidney disease: Secondary | ICD-10-CM | POA: Diagnosis not present

## 2016-05-28 DIAGNOSIS — D692 Other nonthrombocytopenic purpura: Secondary | ICD-10-CM | POA: Diagnosis not present

## 2016-05-28 DIAGNOSIS — G250 Essential tremor: Secondary | ICD-10-CM | POA: Diagnosis not present

## 2016-05-28 DIAGNOSIS — N183 Chronic kidney disease, stage 3 (moderate): Secondary | ICD-10-CM | POA: Diagnosis not present

## 2016-05-28 DIAGNOSIS — E663 Overweight: Secondary | ICD-10-CM | POA: Diagnosis not present

## 2016-05-28 DIAGNOSIS — Z8546 Personal history of malignant neoplasm of prostate: Secondary | ICD-10-CM | POA: Diagnosis not present

## 2016-05-28 DIAGNOSIS — E784 Other hyperlipidemia: Secondary | ICD-10-CM | POA: Diagnosis not present

## 2016-06-01 DIAGNOSIS — L570 Actinic keratosis: Secondary | ICD-10-CM | POA: Diagnosis not present

## 2016-06-01 DIAGNOSIS — C4442 Squamous cell carcinoma of skin of scalp and neck: Secondary | ICD-10-CM | POA: Diagnosis not present

## 2016-06-02 DIAGNOSIS — D045 Carcinoma in situ of skin of trunk: Secondary | ICD-10-CM | POA: Diagnosis not present

## 2016-06-02 DIAGNOSIS — L570 Actinic keratosis: Secondary | ICD-10-CM | POA: Diagnosis not present

## 2016-06-29 DIAGNOSIS — G250 Essential tremor: Secondary | ICD-10-CM | POA: Diagnosis not present

## 2016-06-29 DIAGNOSIS — Z8546 Personal history of malignant neoplasm of prostate: Secondary | ICD-10-CM | POA: Diagnosis not present

## 2016-06-29 DIAGNOSIS — N183 Chronic kidney disease, stage 3 (moderate): Secondary | ICD-10-CM | POA: Diagnosis not present

## 2016-06-29 DIAGNOSIS — D692 Other nonthrombocytopenic purpura: Secondary | ICD-10-CM | POA: Diagnosis not present

## 2016-06-29 DIAGNOSIS — Z6829 Body mass index (BMI) 29.0-29.9, adult: Secondary | ICD-10-CM | POA: Diagnosis not present

## 2016-06-29 DIAGNOSIS — E784 Other hyperlipidemia: Secondary | ICD-10-CM | POA: Diagnosis not present

## 2016-06-29 DIAGNOSIS — I129 Hypertensive chronic kidney disease with stage 1 through stage 4 chronic kidney disease, or unspecified chronic kidney disease: Secondary | ICD-10-CM | POA: Diagnosis not present

## 2016-06-29 DIAGNOSIS — I10 Essential (primary) hypertension: Secondary | ICD-10-CM | POA: Diagnosis not present

## 2016-06-29 DIAGNOSIS — F39 Unspecified mood [affective] disorder: Secondary | ICD-10-CM | POA: Diagnosis not present

## 2016-06-29 DIAGNOSIS — I2510 Atherosclerotic heart disease of native coronary artery without angina pectoris: Secondary | ICD-10-CM | POA: Diagnosis not present

## 2016-07-22 DIAGNOSIS — L821 Other seborrheic keratosis: Secondary | ICD-10-CM | POA: Diagnosis not present

## 2016-07-22 DIAGNOSIS — D485 Neoplasm of uncertain behavior of skin: Secondary | ICD-10-CM | POA: Diagnosis not present

## 2016-07-22 DIAGNOSIS — Z85828 Personal history of other malignant neoplasm of skin: Secondary | ICD-10-CM | POA: Diagnosis not present

## 2016-07-22 DIAGNOSIS — C44222 Squamous cell carcinoma of skin of right ear and external auricular canal: Secondary | ICD-10-CM | POA: Diagnosis not present

## 2016-07-22 DIAGNOSIS — L570 Actinic keratosis: Secondary | ICD-10-CM | POA: Diagnosis not present

## 2016-07-22 DIAGNOSIS — D1801 Hemangioma of skin and subcutaneous tissue: Secondary | ICD-10-CM | POA: Diagnosis not present

## 2016-07-22 DIAGNOSIS — L814 Other melanin hyperpigmentation: Secondary | ICD-10-CM | POA: Diagnosis not present

## 2016-07-22 DIAGNOSIS — C44722 Squamous cell carcinoma of skin of right lower limb, including hip: Secondary | ICD-10-CM | POA: Diagnosis not present

## 2016-08-31 DIAGNOSIS — C44722 Squamous cell carcinoma of skin of right lower limb, including hip: Secondary | ICD-10-CM | POA: Diagnosis not present

## 2016-08-31 DIAGNOSIS — C44222 Squamous cell carcinoma of skin of right ear and external auricular canal: Secondary | ICD-10-CM | POA: Diagnosis not present

## 2016-08-31 NOTE — Progress Notes (Signed)
Cardiology Office Note   Date:  09/03/2016   ID:  [name] [name], DOB [birth year], MRN [id number]  PCP:  Geoffery Lyons, MD  Cardiologist:  Dr. Johnsie Cancel     Chief Complaint  Patient presents with  . Mild aortic stenosis      History of Present Illness: [name] [name] is a 83 y.o. male who presents for CAD. Cath in 2006 with with BMS to LAD.  This was done after neck pain with mowing and + nuc stress test.   2006 he had a bare-metal stent to the RCA and had residual 50-70% narrowing in the LAD. He has good LV function.  He has done quite well over the past year with no chest pain, shortness of breath or palpitations. He works at the [employer] and is outside in hot weather much of the time and has had no symptoms related to this. He walks 2 miles 3 days a week.   Reviewed echo done 10/15 Normal EF and mild AS  - Left ventricle: The cavity size was normal. Wall thickness was increased in a pattern of mild LVH. Systolic function was normal. The estimated ejection fraction was in the range of 55% to 60%. Wall motion was normal; there were no regional wall motion abnormalities. Doppler parameters are consistent with abnormal left ventricular relaxation (grade 1 diastolic dysfunction). - Aortic valve: Trileaflet; moderately calcified leaflets. There was mild stenosis. There was trivial regurgitation. Mean gradient (S): 19 mm Hg. Peak gradient (S): 33 mm Hg. Valve area (VTI): 1.78 cm^2. - Mitral valve: Mildly calcified annulus. There was trivial regurgitation. - Left atrium: The atrium was mildly dilated. - Right ventricle: The cavity size was normal. Systolic function was normal. - Tricuspid valve: Peak RV-RA gradient (S): 23 mm Hg. - Pulmonary arteries: PA peak pressure: 26 mm Hg (S). - Inferior vena cava: The vessel was normal in size. The respirophasic diameter changes were in the normal range (>= 50%), consistent with normal central venous  pressure.  Impressions:  - Normal LV size with mild LV hypertrophy. EF 55-60%. Mild aortic stenosis. Normal RV size and systolic function.   Today: no complaints, no chest pain and no SOB.  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  He and his wife bowl as well.  Dr. Reynaldo Minium follows his lipids, last check was excellent.   August 2017:  Injured left hand recently and plavix held without incident Saw Dr Fredna Dow   Past Medical History:  Diagnosis Date  . CAD, NATIVE VESSEL    post stenting of the RCA in 06/2005 with a bare-metal stent with residual 50-70% narrowing inb the left anterior descending coronary artery   . Diverticulosis   . Hemorrhoids   . History of colon polyps   . HYPERLIPIDEMIA-MIXED   . HYPERTENSION, BENIGN   . Lumbar stenosis   . PROSTATE CANCER, HX OF   . Skin cancer    face, neck  . Tubular adenoma     Past Surgical History:  Procedure Laterality Date  . APPENDECTOMY    . CARDIAC CATHETERIZATION    . COLONOSCOPY  02-2014  . CORONARY STENT PLACEMENT     CAD post stenting of the RCA in 06/2005 with a bare-metal stent with residual 50-70% narrowing inb the left anterior descending coronary artery   . MOHS SURGERY     x 2  . PROSTATECTOMY       Current Outpatient Prescriptions  Medication Sig  Dispense Refill  . amLODipine (NORVASC) 10 MG tablet Take 1 tablet (10 mg total) by mouth daily. 90 tablet 4  . aspirin 81 MG tablet Take 81 mg by mouth daily.     . benazepril (LOTENSIN) 20 MG tablet Take 1 tablet (20 mg total) by mouth daily. 90 tablet 4  . clopidogrel (PLAVIX) 75 MG tablet Take 1 tablet (75 mg total) by mouth daily. 30 tablet 11  . fish oil-omega-3 fatty acids 1000 MG capsule Take 1 g by mouth daily.    . nitroGLYCERIN (NITROSTAT) 0.4 MG SL tablet Place 1 tablet (0.4 mg total) under the tongue every 5 (five) minutes as needed. 25 tablet 3  . simvastatin (ZOCOR) 20 MG tablet Take 20 mg by mouth daily.     Marland Kitchen triamterene-hydrochlorothiazide (MAXZIDE) 75-50 MG per tablet Take 1 tablet by mouth daily.     No current facility-administered medications for this visit.     Allergies:   Patient has no known allergies.    Social History:  The patient  reports that he has never smoked. He has never used smokeless tobacco. He reports that he does not drink alcohol or use drugs.   Family History:  The patient's family history includes Heart disease in his mother; Hypertension in his mother.    ROS:  General:no colds or fevers, no weight changes Skin:no rashes or ulcers- has had precancerous lesions removed from face and scalp.   HEENT:no blurred vision, no congestion CV:see HPI PUL:see HPI GI:no diarrhea constipation or melena, no indigestion GU:no hematuria, no dysuria MS:no joint pain, no claudication Neuro:no syncope, no lightheadedness Endo:no diabetes, no thyroid disease  Wt Readings from Last 3 Encounters:  09/03/16 202 lb 3.2 oz (91.7 kg)  01/21/16 203 lb (92.1 kg)  09/06/15 209 lb (94.8 kg)     PHYSICAL EXAM: VS:  BP 130/60   Pulse 62   Ht 5\' 10"  (1.778 m)   Wt 202 lb 3.2 oz (91.7 kg)   SpO2 98%   BMI 29.01 kg/m  , BMI Body mass index is 29.01 kg/m. General:Pleasant affect, NAD Skin:Warm and dry, brisk capillary refill HEENT:normocephalic, sclera clear, mucus membranes moist Neck:supple, no JVD, no bruits  Heart:S1S2 RRR  Mild AS  murmur, gallup, rub or click Lungs:clear without rales, rhonchi, or wheezes [id number], non tender, + BS, do not palpate liver spleen or masses Ext:no lower ext edema, 2+ pedal pulses, 2+ radial pulses Neuro:alert and oriented X 3, MAE, follows commands, + facial symmetry    EKG:   SB with 1st degree AV block. LAD but no changes from 05/2014 except PR slightly longer at 290 ms.  09/03/16 SR rate 56 PR 312 LVH inferior lateral T wave inversions   Recent Labs: No results found for requested labs within last 8760 hours.    Lipid Panel No  results found for: CHOL, TRIG, HDL, CHOLHDL, VLDL, LDLCALC, LDLDIRECT     Other studies Reviewed: Additional studies/ records that were reviewed today include: will get copy of lipids from Dr. Reynaldo Minium. .  Cardiac cath 2006: THE LEFT MAIN CORONARY ARTERY was free of significant disease.  THE LEFT ANTERIOR DESCENDING ARTERY had heavy calcification proximally. The LAD gave rise to 2 sepal perforators, a small diagonal branch, and then a moderate diagonal branch. There was a moderately long area of segmental disease in the proximal LAD of 50 to 70% with calcification. There was also 50% ostial stenosis in the first small diagonal branch.  THE CIRCUMFLEX ARTERY gave rise  to a small marginal branch, a large marginal branch, and a posterolateral branch. These vessels were irregular, and there was no significant obstruction.  THE RIGHT CORONARY ARTERY gave rise to a conus, had a 95% proximal stenosis and a 50% proximal stenosis with TIMI-2 flow distally. The right coronary artery gave rise to a right ventricular branch, a posterior descending branch, and 4 posterolateral branches. The distal right coronary artery also filled faintly by collaterals from the left coronary artery.  THE LEFT VENTRICULOGRAM performed in the RAO projection showed hypokinesis of the anterolateral wall and apex. The inferior wall moved well. The estimated ejection fraction was 45%.  CONCLUSION: Coronary artery disease with 50 to 70% stenosis in the proximal left anterior descending, no significant obstruction in the circumflex artery, 95% proximal stenosis in the right coronary artery, and anterolateral wall and apical wall hypokinesis with estimated ejection fraction of 45%.   RESULTS: Initially the stenosis in the proximal LAD was estimated at 95%, and there was a tandem lesion which was about 50%. The distal flow was TIMI- 2 flow. Following stenting, the two stenoses  improved to 0%, and the flow improved to TIMI-3 flow. There was residual 70% stenosis in the mid portion of the posterior descending branch.  CONCLUSION: Successful percutaneous coronary intervention of the lesion in the proximal left anterior descending using a bare metal stent with improvement in narrowing from 95% to 0% and improvement in flow from TIMI-2 to TIMI-3 flow.   ASSESSMENT AND PLAN:  1.  CAD continue medication- hx PCI with BMS to LAD 2006.  No chest pain or SOB  2. HTN-essential stable.   3  Hyperlipidemia-followed by Dr. Reynaldo Minium  4. Ortho: left hand improved   5. AS: mild by echo 06/2014 mean gradient 19 mmHg peak 33 mmHg needs f/u echo By  Exam it appears moderate but S2 still preserved   Current medicines are reviewed with the patient today.  The patient Has no concerns regarding medicines.  Jenkins Rouge

## 2016-09-03 ENCOUNTER — Ambulatory Visit (INDEPENDENT_AMBULATORY_CARE_PROVIDER_SITE_OTHER): Payer: Medicare Other | Admitting: Cardiovascular Disease

## 2016-09-03 ENCOUNTER — Encounter (INDEPENDENT_AMBULATORY_CARE_PROVIDER_SITE_OTHER): Payer: Self-pay

## 2016-09-03 ENCOUNTER — Encounter: Payer: Self-pay | Admitting: Cardiovascular Disease

## 2016-09-03 VITALS — BP 130/60 | HR 62 | Ht 70.0 in | Wt 202.2 lb

## 2016-09-03 DIAGNOSIS — I350 Nonrheumatic aortic (valve) stenosis: Secondary | ICD-10-CM | POA: Diagnosis not present

## 2016-09-03 NOTE — Patient Instructions (Signed)

## 2016-09-21 ENCOUNTER — Ambulatory Visit (HOSPITAL_COMMUNITY): Payer: Medicare Other | Attending: Cardiology

## 2016-09-21 ENCOUNTER — Other Ambulatory Visit: Payer: Self-pay

## 2016-09-21 DIAGNOSIS — I083 Combined rheumatic disorders of mitral, aortic and tricuspid valves: Secondary | ICD-10-CM | POA: Diagnosis not present

## 2016-09-21 DIAGNOSIS — C44222 Squamous cell carcinoma of skin of right ear and external auricular canal: Secondary | ICD-10-CM | POA: Diagnosis not present

## 2016-09-21 DIAGNOSIS — I10 Essential (primary) hypertension: Secondary | ICD-10-CM | POA: Diagnosis not present

## 2016-09-21 DIAGNOSIS — I2510 Atherosclerotic heart disease of native coronary artery without angina pectoris: Secondary | ICD-10-CM | POA: Diagnosis not present

## 2016-09-21 DIAGNOSIS — I350 Nonrheumatic aortic (valve) stenosis: Secondary | ICD-10-CM | POA: Diagnosis not present

## 2016-09-21 DIAGNOSIS — Z8546 Personal history of malignant neoplasm of prostate: Secondary | ICD-10-CM | POA: Diagnosis not present

## 2016-09-21 DIAGNOSIS — E785 Hyperlipidemia, unspecified: Secondary | ICD-10-CM | POA: Insufficient documentation

## 2016-09-21 DIAGNOSIS — Z8249 Family history of ischemic heart disease and other diseases of the circulatory system: Secondary | ICD-10-CM | POA: Diagnosis not present

## 2016-11-05 DIAGNOSIS — C44719 Basal cell carcinoma of skin of left lower limb, including hip: Secondary | ICD-10-CM | POA: Diagnosis not present

## 2016-11-05 DIAGNOSIS — Z85828 Personal history of other malignant neoplasm of skin: Secondary | ICD-10-CM | POA: Diagnosis not present

## 2016-11-05 DIAGNOSIS — L821 Other seborrheic keratosis: Secondary | ICD-10-CM | POA: Diagnosis not present

## 2016-11-05 DIAGNOSIS — L570 Actinic keratosis: Secondary | ICD-10-CM | POA: Diagnosis not present

## 2016-11-05 DIAGNOSIS — D1801 Hemangioma of skin and subcutaneous tissue: Secondary | ICD-10-CM | POA: Diagnosis not present

## 2016-11-05 DIAGNOSIS — D485 Neoplasm of uncertain behavior of skin: Secondary | ICD-10-CM | POA: Diagnosis not present

## 2016-11-05 DIAGNOSIS — L814 Other melanin hyperpigmentation: Secondary | ICD-10-CM | POA: Diagnosis not present

## 2016-11-09 DIAGNOSIS — C44719 Basal cell carcinoma of skin of left lower limb, including hip: Secondary | ICD-10-CM | POA: Diagnosis not present

## 2016-11-16 DIAGNOSIS — Z961 Presence of intraocular lens: Secondary | ICD-10-CM | POA: Diagnosis not present

## 2016-11-16 DIAGNOSIS — H52203 Unspecified astigmatism, bilateral: Secondary | ICD-10-CM | POA: Diagnosis not present

## 2016-11-23 ENCOUNTER — Telehealth: Payer: Self-pay | Admitting: Cardiovascular Disease

## 2016-11-23 NOTE — Telephone Encounter (Signed)
Walk In Pt Form-Newspaper Dropped off placed in [employer].

## 2016-12-04 NOTE — Telephone Encounter (Signed)
Dr. Johnsie Cancel reviewed patient's article about Omega Q Plus Resveratrol. Dr. Johnsie Cancel is fine with patient take supplement.Tried to call patient back. Patient's phone number is invalid.

## 2016-12-24 DIAGNOSIS — C44719 Basal cell carcinoma of skin of left lower limb, including hip: Secondary | ICD-10-CM | POA: Diagnosis not present

## 2016-12-28 DIAGNOSIS — I10 Essential (primary) hypertension: Secondary | ICD-10-CM | POA: Diagnosis not present

## 2016-12-28 DIAGNOSIS — Z125 Encounter for screening for malignant neoplasm of prostate: Secondary | ICD-10-CM | POA: Diagnosis not present

## 2016-12-28 DIAGNOSIS — E784 Other hyperlipidemia: Secondary | ICD-10-CM | POA: Diagnosis not present

## 2017-01-07 DIAGNOSIS — H6123 Impacted cerumen, bilateral: Secondary | ICD-10-CM | POA: Diagnosis not present

## 2017-01-07 DIAGNOSIS — H903 Sensorineural hearing loss, bilateral: Secondary | ICD-10-CM | POA: Diagnosis not present

## 2017-01-22 DIAGNOSIS — Z1212 Encounter for screening for malignant neoplasm of rectum: Secondary | ICD-10-CM | POA: Diagnosis not present

## 2017-01-28 DIAGNOSIS — I2510 Atherosclerotic heart disease of native coronary artery without angina pectoris: Secondary | ICD-10-CM | POA: Diagnosis not present

## 2017-01-28 DIAGNOSIS — I10 Essential (primary) hypertension: Secondary | ICD-10-CM | POA: Diagnosis not present

## 2017-01-28 DIAGNOSIS — N183 Chronic kidney disease, stage 3 (moderate): Secondary | ICD-10-CM | POA: Diagnosis not present

## 2017-01-28 DIAGNOSIS — E784 Other hyperlipidemia: Secondary | ICD-10-CM | POA: Diagnosis not present

## 2017-01-28 DIAGNOSIS — D692 Other nonthrombocytopenic purpura: Secondary | ICD-10-CM | POA: Diagnosis not present

## 2017-01-28 DIAGNOSIS — Z0000 Encounter for general adult medical examination without abnormal findings: Secondary | ICD-10-CM | POA: Diagnosis not present

## 2017-01-28 DIAGNOSIS — E663 Overweight: Secondary | ICD-10-CM | POA: Diagnosis not present

## 2017-01-28 DIAGNOSIS — G250 Essential tremor: Secondary | ICD-10-CM | POA: Diagnosis not present

## 2017-01-28 DIAGNOSIS — I129 Hypertensive chronic kidney disease with stage 1 through stage 4 chronic kidney disease, or unspecified chronic kidney disease: Secondary | ICD-10-CM | POA: Diagnosis not present

## 2017-01-28 DIAGNOSIS — Z6828 Body mass index (BMI) 28.0-28.9, adult: Secondary | ICD-10-CM | POA: Diagnosis not present

## 2017-01-28 DIAGNOSIS — Z1389 Encounter for screening for other disorder: Secondary | ICD-10-CM | POA: Diagnosis not present

## 2017-05-10 DIAGNOSIS — D225 Melanocytic nevi of trunk: Secondary | ICD-10-CM | POA: Diagnosis not present

## 2017-05-10 DIAGNOSIS — Z85828 Personal history of other malignant neoplasm of skin: Secondary | ICD-10-CM | POA: Diagnosis not present

## 2017-05-10 DIAGNOSIS — D1801 Hemangioma of skin and subcutaneous tissue: Secondary | ICD-10-CM | POA: Diagnosis not present

## 2017-05-10 DIAGNOSIS — D485 Neoplasm of uncertain behavior of skin: Secondary | ICD-10-CM | POA: Diagnosis not present

## 2017-05-10 DIAGNOSIS — L821 Other seborrheic keratosis: Secondary | ICD-10-CM | POA: Diagnosis not present

## 2017-05-10 DIAGNOSIS — D044 Carcinoma in situ of skin of scalp and neck: Secondary | ICD-10-CM | POA: Diagnosis not present

## 2017-05-10 DIAGNOSIS — L570 Actinic keratosis: Secondary | ICD-10-CM | POA: Diagnosis not present

## 2017-05-10 DIAGNOSIS — C44722 Squamous cell carcinoma of skin of right lower limb, including hip: Secondary | ICD-10-CM | POA: Diagnosis not present

## 2017-05-10 DIAGNOSIS — L814 Other melanin hyperpigmentation: Secondary | ICD-10-CM | POA: Diagnosis not present

## 2017-06-07 DIAGNOSIS — D044 Carcinoma in situ of skin of scalp and neck: Secondary | ICD-10-CM | POA: Diagnosis not present

## 2017-06-07 DIAGNOSIS — L905 Scar conditions and fibrosis of skin: Secondary | ICD-10-CM | POA: Diagnosis not present

## 2017-06-07 DIAGNOSIS — D0471 Carcinoma in situ of skin of right lower limb, including hip: Secondary | ICD-10-CM | POA: Diagnosis not present

## 2017-06-08 DIAGNOSIS — L905 Scar conditions and fibrosis of skin: Secondary | ICD-10-CM | POA: Diagnosis not present

## 2017-06-10 DIAGNOSIS — H10413 Chronic giant papillary conjunctivitis, bilateral: Secondary | ICD-10-CM | POA: Diagnosis not present

## 2017-07-26 DIAGNOSIS — H9190 Unspecified hearing loss, unspecified ear: Secondary | ICD-10-CM | POA: Diagnosis not present

## 2017-07-26 DIAGNOSIS — L570 Actinic keratosis: Secondary | ICD-10-CM | POA: Diagnosis not present

## 2017-07-26 DIAGNOSIS — E663 Overweight: Secondary | ICD-10-CM | POA: Diagnosis not present

## 2017-07-26 DIAGNOSIS — I10 Essential (primary) hypertension: Secondary | ICD-10-CM | POA: Diagnosis not present

## 2017-07-26 DIAGNOSIS — I129 Hypertensive chronic kidney disease with stage 1 through stage 4 chronic kidney disease, or unspecified chronic kidney disease: Secondary | ICD-10-CM | POA: Diagnosis not present

## 2017-07-26 DIAGNOSIS — I2510 Atherosclerotic heart disease of native coronary artery without angina pectoris: Secondary | ICD-10-CM | POA: Diagnosis not present

## 2017-07-26 DIAGNOSIS — Z8546 Personal history of malignant neoplasm of prostate: Secondary | ICD-10-CM | POA: Diagnosis not present

## 2017-07-26 DIAGNOSIS — G250 Essential tremor: Secondary | ICD-10-CM | POA: Diagnosis not present

## 2017-07-26 DIAGNOSIS — E7849 Other hyperlipidemia: Secondary | ICD-10-CM | POA: Diagnosis not present

## 2017-07-26 DIAGNOSIS — N183 Chronic kidney disease, stage 3 (moderate): Secondary | ICD-10-CM | POA: Diagnosis not present

## 2017-07-26 DIAGNOSIS — F39 Unspecified mood [affective] disorder: Secondary | ICD-10-CM | POA: Diagnosis not present

## 2017-07-26 DIAGNOSIS — D692 Other nonthrombocytopenic purpura: Secondary | ICD-10-CM | POA: Diagnosis not present

## 2017-08-09 DIAGNOSIS — L570 Actinic keratosis: Secondary | ICD-10-CM | POA: Diagnosis not present

## 2017-08-09 DIAGNOSIS — Z85828 Personal history of other malignant neoplasm of skin: Secondary | ICD-10-CM | POA: Diagnosis not present

## 2017-09-20 DIAGNOSIS — L570 Actinic keratosis: Secondary | ICD-10-CM | POA: Diagnosis not present

## 2017-11-30 DIAGNOSIS — H6123 Impacted cerumen, bilateral: Secondary | ICD-10-CM | POA: Diagnosis not present

## 2017-11-30 DIAGNOSIS — H903 Sensorineural hearing loss, bilateral: Secondary | ICD-10-CM | POA: Diagnosis not present

## 2018-01-03 DIAGNOSIS — L814 Other melanin hyperpigmentation: Secondary | ICD-10-CM | POA: Diagnosis not present

## 2018-01-03 DIAGNOSIS — D225 Melanocytic nevi of trunk: Secondary | ICD-10-CM | POA: Diagnosis not present

## 2018-01-03 DIAGNOSIS — L570 Actinic keratosis: Secondary | ICD-10-CM | POA: Diagnosis not present

## 2018-01-03 DIAGNOSIS — L821 Other seborrheic keratosis: Secondary | ICD-10-CM | POA: Diagnosis not present

## 2018-01-03 DIAGNOSIS — Z85828 Personal history of other malignant neoplasm of skin: Secondary | ICD-10-CM | POA: Diagnosis not present

## 2018-01-25 DIAGNOSIS — I10 Essential (primary) hypertension: Secondary | ICD-10-CM | POA: Diagnosis not present

## 2018-01-25 DIAGNOSIS — Z125 Encounter for screening for malignant neoplasm of prostate: Secondary | ICD-10-CM | POA: Diagnosis not present

## 2018-01-25 DIAGNOSIS — E7849 Other hyperlipidemia: Secondary | ICD-10-CM | POA: Diagnosis not present

## 2018-01-25 DIAGNOSIS — R82998 Other abnormal findings in urine: Secondary | ICD-10-CM | POA: Diagnosis not present

## 2018-01-31 DIAGNOSIS — Z0000 Encounter for general adult medical examination without abnormal findings: Secondary | ICD-10-CM | POA: Diagnosis not present

## 2018-01-31 DIAGNOSIS — I129 Hypertensive chronic kidney disease with stage 1 through stage 4 chronic kidney disease, or unspecified chronic kidney disease: Secondary | ICD-10-CM | POA: Diagnosis not present

## 2018-01-31 DIAGNOSIS — Z1389 Encounter for screening for other disorder: Secondary | ICD-10-CM | POA: Diagnosis not present

## 2018-01-31 DIAGNOSIS — I2510 Atherosclerotic heart disease of native coronary artery without angina pectoris: Secondary | ICD-10-CM | POA: Diagnosis not present

## 2018-01-31 DIAGNOSIS — L570 Actinic keratosis: Secondary | ICD-10-CM | POA: Diagnosis not present

## 2018-01-31 DIAGNOSIS — N183 Chronic kidney disease, stage 3 (moderate): Secondary | ICD-10-CM | POA: Diagnosis not present

## 2018-01-31 DIAGNOSIS — F39 Unspecified mood [affective] disorder: Secondary | ICD-10-CM | POA: Diagnosis not present

## 2018-01-31 DIAGNOSIS — E7849 Other hyperlipidemia: Secondary | ICD-10-CM | POA: Diagnosis not present

## 2018-01-31 DIAGNOSIS — H9190 Unspecified hearing loss, unspecified ear: Secondary | ICD-10-CM | POA: Diagnosis not present

## 2018-01-31 DIAGNOSIS — Z6829 Body mass index (BMI) 29.0-29.9, adult: Secondary | ICD-10-CM | POA: Diagnosis not present

## 2018-01-31 DIAGNOSIS — I10 Essential (primary) hypertension: Secondary | ICD-10-CM | POA: Diagnosis not present

## 2018-01-31 DIAGNOSIS — D692 Other nonthrombocytopenic purpura: Secondary | ICD-10-CM | POA: Diagnosis not present

## 2018-02-03 DIAGNOSIS — Z1212 Encounter for screening for malignant neoplasm of rectum: Secondary | ICD-10-CM | POA: Diagnosis not present

## 2018-03-08 DIAGNOSIS — K921 Melena: Secondary | ICD-10-CM | POA: Diagnosis not present

## 2018-03-21 DIAGNOSIS — H52203 Unspecified astigmatism, bilateral: Secondary | ICD-10-CM | POA: Diagnosis not present

## 2018-03-21 DIAGNOSIS — Z961 Presence of intraocular lens: Secondary | ICD-10-CM | POA: Diagnosis not present

## 2018-03-31 DIAGNOSIS — K921 Melena: Secondary | ICD-10-CM | POA: Diagnosis not present

## 2018-03-31 DIAGNOSIS — Z86010 Personal history of colonic polyps: Secondary | ICD-10-CM | POA: Diagnosis not present

## 2018-04-04 ENCOUNTER — Telehealth: Payer: Self-pay

## 2018-04-04 NOTE — Telephone Encounter (Signed)
   [facility] Pre-operative Risk Assessment    Request for surgical clearance:  1. What type of surgery is being performed? Colonoscopy    2. When is this surgery scheduled? 05/31/18   3. What type of clearance is required (medical clearance vs. Pharmacy clearance to hold med vs. Both)? Both  4. Are there any medications that need to be held prior to surgery and how long? Plavix (Wants to hold 2 days prior to procedure)   5. Practice name and name of physician performing surgery?  [facility] , Dr.Edwards   6. What is your office phone number [phone]    7.   What is your office fax number [phone]  8.   Anesthesia type (None, local, MAC, general) ? None listed    Mendel Ryder 04/04/2018, 9:14 AM  _________________________________________________________________   (provider comments below)  Pt was last seen by Dr.Nishan on 11/23/16

## 2018-04-04 NOTE — Telephone Encounter (Signed)
Patient has not been seen since 08/2016    Primary Cardiologist: Johnsie Cancel  Chart reviewed as part of pre-operative protocol coverage. Because of MAKAR SLATTER past medical history and time since last visit, he/she will require a follow-up visit in order to better assess preoperative cardiovascular risk.  Pre-op covering staff: - Please schedule appointment and call patient to inform them. - Please contact requesting surgeon's office via preferred method (i.e, phone, fax) to inform them of need for appointment prior to surgery.  Truitt Merle, NP  04/04/2018, 3:05 PM

## 2018-04-05 NOTE — Telephone Encounter (Signed)
Left message for patient to contact office.

## 2018-04-06 NOTE — Telephone Encounter (Signed)
Patient notified of appt in 9.11.2019; he voiced understanding.

## 2018-04-20 ENCOUNTER — Encounter: Payer: Self-pay | Admitting: Physician Assistant

## 2018-05-04 ENCOUNTER — Ambulatory Visit: Payer: Medicare Other | Admitting: Physician Assistant

## 2018-05-11 ENCOUNTER — Telehealth: Payer: Self-pay | Admitting: Physician Assistant

## 2018-05-11 ENCOUNTER — Encounter: Payer: Self-pay | Admitting: Physician Assistant

## 2018-05-11 ENCOUNTER — Ambulatory Visit (INDEPENDENT_AMBULATORY_CARE_PROVIDER_SITE_OTHER): Payer: Medicare Other | Admitting: Physician Assistant

## 2018-05-11 VITALS — BP 130/70 | HR 55 | Ht 70.0 in | Wt 205.0 lb

## 2018-05-11 DIAGNOSIS — I10 Essential (primary) hypertension: Secondary | ICD-10-CM | POA: Diagnosis not present

## 2018-05-11 DIAGNOSIS — I2510 Atherosclerotic heart disease of native coronary artery without angina pectoris: Secondary | ICD-10-CM | POA: Diagnosis not present

## 2018-05-11 DIAGNOSIS — I350 Nonrheumatic aortic (valve) stenosis: Secondary | ICD-10-CM | POA: Diagnosis not present

## 2018-05-11 DIAGNOSIS — Z01810 Encounter for preprocedural cardiovascular examination: Secondary | ICD-10-CM

## 2018-05-11 DIAGNOSIS — E785 Hyperlipidemia, unspecified: Secondary | ICD-10-CM

## 2018-05-11 DIAGNOSIS — I453 Trifascicular block: Secondary | ICD-10-CM

## 2018-05-11 NOTE — Progress Notes (Addendum)
Cardiology Office Note    Date:  05/11/2018  ID:  [name] [name], DOB Jan 03, 1932, MRN [id number] PCP:  Burnard Bunting, MD  Cardiologist:  Jenkins Rouge, MD   Chief Complaint: overdue f/u CAD, preop clearance for colonoscopy  History of Present Illness:  [name] [name] is a 84 y.o. male with history of CAD (BMS to ?RCA 2006 with residual 50-70% LAD), moderate aortic stenosis, HTN, HLD, lumbar stenosis, diverticulosis, hemorrhoids, tubular adenoma, possible CKD stage II who presents for pre-op clearance/overdue follow-up.   There are 2 cath notes in the system from 07/06/05. One cath notes outlines 95% RCA stenosis with 50-70% residual LAD stenosis. A second cath note outlining the intervention indicates a conclusion of "successful percutaneous coronary intervention of the lesion in the proximal left anterior descending using a bare metal stent with improvement in narrowing from 95% to 0%." However, the discharge summary from that time indicates he had a stent to the RCA, not the LAD. I am not sure if this was a dictation error as the RCA was outlined in the prior note as the one with 95% stenosis. Last nuc 2007 with diaphraghmatic attenuation but no ischemia. Last echo 08/2016 showed EF 55-60%, mild LVH, grade 1 DD, moderate AS, mild AI/MR/TR, mildly elevated PA pressure. He has been maintained on ASA/Plavix and is pending colonoscopy. Last labs by [facility] 01/2018 showed LDL 93, trig 190, Hgb 14.7, Cr 1.1, ALT 21, TSH 2.45.  We received a pre-op clearance request for colonoscopy coming up 05/31/18, and it was noted he was overdue for follow-up so he was asked to return. He stays very active [facility] [employer], attending church, working at the [employer] and working at [employer] 2 days a week. He denies any CP, SOB, palpitations, fatigue, dizziness, near syncope or syncope. He states the colonoscopy is for routine screening, facilitated through PCP.   Past Medical History:  Diagnosis Date   . CAD, NATIVE VESSEL    a. stenting of the RCA in 06/2005 with a bare-metal stent with residual 50-70% narrowing in the left anterior descending coronary artery. b. nonischemic nuc 2007.  . Diverticulosis   . Hemorrhoids   . History of colon polyps   . HYPERLIPIDEMIA-MIXED   . HYPERTENSION, BENIGN   . Lumbar stenosis   . Moderate aortic stenosis   . PROSTATE CANCER, HX OF   . Skin cancer    face, neck  . Tubular adenoma     Past Surgical History:  Procedure Laterality Date  . APPENDECTOMY    . CARDIAC CATHETERIZATION    . COLONOSCOPY  02-2014  . CORONARY STENT PLACEMENT     CAD post stenting of the RCA in 06/2005 with a bare-metal stent with residual 50-70% narrowing inb the left anterior descending coronary artery   . MOHS SURGERY     x 2  . PROSTATECTOMY      Current Medications: Current Meds  Medication Sig  . amLODipine (NORVASC) 10 MG tablet Take 1 tablet (10 mg total) by mouth daily.  Marland Kitchen aspirin 81 MG tablet Take 81 mg by mouth daily.   . benazepril (LOTENSIN) 20 MG tablet Take 1 tablet (20 mg total) by mouth daily.  . clopidogrel (PLAVIX) 75 MG tablet Take 1 tablet (75 mg total) by mouth daily.  . fish oil-omega-3 fatty acids 1000 MG capsule Take 1 g by mouth daily.  . nitroGLYCERIN (NITROSTAT) 0.4 MG SL tablet Place 1 tablet (0.4 mg total) under the tongue  every 5 (five) minutes as needed.  . simvastatin (ZOCOR) 20 MG tablet Take 20 mg by mouth daily.  Marland Kitchen triamterene-hydrochlorothiazide (MAXZIDE) 75-50 MG per tablet Take 1 tablet by mouth daily.      Allergies:   Patient has no known allergies.   Social History   Socioeconomic History  . Marital status: Married    Spouse name: Not on file  . Number of children: 2  . Years of education: Not on file  . Highest education level: Not on file  Occupational History  . Occupation: retired Secondary school teacher  Social Needs  . Financial resource strain: Not on file  . Food insecurity:    Worry: Not on file     Inability: Not on file  . Transportation needs:    Medical: Not on file    Non-medical: Not on file  Tobacco Use  . Smoking status: Never Smoker  . Smokeless tobacco: Never Used  Substance and Sexual Activity  . Alcohol use: No  . Drug use: No  . Sexual activity: Not on file  Lifestyle  . Physical activity:    Days per week: Not on file    Minutes per session: Not on file  . Stress: Not on file  Relationships  . Social connections:    Talks on phone: Not on file    Gets together: Not on file    Attends religious service: Not on file    Active member of club or organization: Not on file    Attends meetings of clubs or organizations: Not on file    Relationship status: Not on file  Other Topics Concern  . Not on file  Social History Narrative  . Not on file     Family History:  The patient's family history includes Heart disease in his mother; Hypertension in his mother. There is no history of Colon cancer, Esophageal cancer, Stomach cancer, Rectal cancer, Heart attack, or Stroke.  ROS:   Please see the history of present illness.  All other systems are reviewed and otherwise negative.    PHYSICAL EXAM:   VS:  BP 130/70   Pulse (!) 55   Ht 5\' 10"  (1.778 m)   Wt 205 lb (93 kg)   SpO2 98%   BMI 29.41 kg/m   BMI: Body mass index is 29.41 kg/m. GEN: Well nourished, well developed WM, in no acute distress HEENT: normocephalic, atraumatic Neck: no JVD, carotid bruits, or masses Cardiac: RRR, 2/6 SEM with preserved S2, no rubs or gallops, no edema  Respiratory:  clear to auscultation bilaterally, normal work of breathing GI: soft, nontender, nondistended, + BS MS: no deformity or atrophy Skin: warm and dry, no rash Neuro:  Alert and Oriented x 3, Strength and sensation are intact, follows commands Psych: euthymic mood, full affect  Wt Readings from Last 3 Encounters:  05/11/18 205 lb (93 kg)  09/03/16 202 lb 3.2 oz (91.7 kg)  01/21/16 203 lb (92.1 kg)       Studies/Labs Reviewed:   EKG:  EKG was ordered today and personally reviewed by me and demonstrates sinus bradycardia 55bpm with first degree AV block, RBBB, LAFB, QRS 12ms, nonspecific ST-T changes - similar to prior except QRS slightly wider now.  Recent Labs: No results found for requested labs within last 8760 hours.   Lipid Panel No results found for: CHOL, TRIG, HDL, CHOLHDL, VLDL, LDLCALC, LDLDIRECT  Additional studies/ records that were reviewed today include: Summarized above. Cath notes as below:  DATE  OF PROCEDURE:  07/06/2005  DATE OF DISCHARGE:                              CARDIAC CATHETERIZATION   CLINICAL HISTORY:  Mr. 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  He had an episode of neck pain  while he was mowing the lawn about 3 weeks ago and had a Cardiolite scan  done in our office which showed inferior scar with some ischemia.  He was  seen in consultation by Dr. Percival Spanish and scheduled today for evaluation with  angiography.   DESCRIPTION OF PROCEDURE:  The procedure was performed by the right femoral  artery using arterial sheath and 6-French preformed coronary catheters.  A  frontal arterial puncture was performed, and Omnipaque contrast was used.  The patient tolerated the procedure well and left the laboratory in  satisfactory condition.   RESULTS:  The aortic pressure was 180/82 with a mean of 120.  Left  ventricular pressure was 180/23.   THE LEFT MAIN CORONARY ARTERY was free of significant disease.   THE LEFT ANTERIOR DESCENDING ARTERY had heavy calcification proximally.  The  LAD gave rise to 2 sepal perforators, a small diagonal branch, and then a  moderate diagonal branch.  There was a moderately long area of segmental  disease in the proximal LAD of 50 to 70% with calcification.  There was also  50% ostial stenosis in the first small diagonal branch.   THE CIRCUMFLEX ARTERY gave  rise to a small marginal branch, a large marginal  branch, and a posterolateral branch.  These vessels were irregular, and  there was no significant obstruction.   THE RIGHT CORONARY ARTERY gave rise to a conus, had a 95% proximal stenosis  and a 50% proximal stenosis with TIMI-2 flow distally.  The right coronary  artery gave rise to a right ventricular branch, a posterior descending  branch, and 4 posterolateral branches.  The distal right coronary artery  also filled faintly by collaterals from the left coronary artery.   THE LEFT VENTRICULOGRAM performed in the RAO projection showed hypokinesis  of the anterolateral wall and apex.  The inferior wall moved well.  The  estimated ejection fraction was 45%.   CONCLUSION:  Coronary artery disease with 50 to 70% stenosis in the proximal  left anterior descending, no significant obstruction in the circumflex  artery, 95% proximal stenosis in the right coronary artery, and  anterolateral wall and apical wall hypokinesis with estimated ejection  fraction of 45%.   RECOMMENDATIONS:  The patient has a tight lesion in the right coronary  artery, and his recent scan showed inferior ischemia.  I think we should  proceed with intervention on the right coronary artery today.  Surprisingly,  the  anterolateral wall and apex are hypokinetic.  This was not a finding on the  Cardiolite scan, and the LAD stenosis does not appear to me to be flow  limiting.  We may want to reevaluate him with an exercise stress Cardiolite  scan later to reassess the significance of the LAD stenosis after he has had  revascularization of his right coronary artery.         ______________________________  Eustace Quail, M.D. [facility]   ----  DATE OF PROCEDURE:  07/06/2005  DATE OF DISCHARGE:  CARDIAC CATHETERIZATION   CLINICAL HISTORY:  Mr. Petrasek is 84 years old and 3 weeks ago had an episode  of jaw pain and shortness of breath while  mowing the lawn.  He subsequently  had a Cardiolite scan which showed inferior scar and superimposed ischemia,  and he was scheduled for evaluation with angiography.  He was done earlier  this morning in the outpatient laboratory.  Because of the severity of the  lesion, we decided to bring him upstairs and perform percutaneous coronary  intervention today.   DESCRIPTION OF PROCEDURE:  The procedure was performed via the right femoral  artery using arterial sheath and 6-French JR4 guiding catheter with side  holes.  The patient was given Angiomax bolus and infusion.  He was enrolled  in CHAMPION trial and was randomized to Plavix versus Integralin.  He also  was given 4 chewable aspirin.  We navigated across the lesion with a PT2  light support wire without difficulty.  We pre-dilated with a 2.5 x 20 mm  maverick, performing 1 inflation up to 8 atmospheres for 30 seconds.  We  then deployed a 3.5 x 24 mm Liberte stent, deploying this with 1 inflation  of 16 atmospheres for 30 seconds.  We post dilated with a 4 x 20 mm Quantum  Maverick, performing 1 inflation up to 15 atmospheres for 30 seconds.  Final  diagnostic study was then performed through the guiding catheter.  The  patient tolerated the procedure well and left the laboratory in satisfactory  condition.   RESULTS:  Initially the stenosis in the proximal LAD was estimated at 95%,  and there was a tandem lesion which was about 50%.  The distal flow was TIMI-  2 flow.  Following stenting, the two stenoses improved to 0%, and the flow  improved to TIMI-3 flow.  There was residual 70% stenosis in the mid portion  of the posterior descending branch.   CONCLUSION:  Successful percutaneous coronary intervention of the lesion in  the proximal left anterior descending using a bare metal stent with  improvement in narrowing from 95% to 0% and improvement in flow from TIMI-2  to TIMI-3 flow.   DISPOSITION:  The patient was returned to  room for further observation.  I  am concerned about the wall motion abnormality in this patient, the LAD and  the borderline LAD lesion.  I think we may want to repeat a Cardialite scan  using exercise several weeks after this revascularization to reassess the  LAD lesion.           ______________________________  Eustace Quail, M.D. [facility]     BB/MEDQ  D:  07/06/2005  T:  07/06/2005  Job:  [id number]         ASSESSMENT & PLAN:   1. Pre-operative cardiovascular examination - clinically he has had no concerning cardiac symptoms which would prohibit him from proceeding with colonoscopy. He is noted to have a trifascicular block on EKG with a RBBB + LAFB + 1st degree AVB. He is not on any AVN blocking agents. I reviewed with Dr. Burt Knack today (DOD) in clinic. There is no specific contraindication to proceeding with colonoscopy based on this EKG, however, at baseline he is at increased risk for bradyarrhythmias therefore we would suggest the colonoscopy be performed in hospital setting instead of office-based setting. I will await echocardiogram result before providing formal clearance but will fax a separate phone note updating GI of the location recommendation. I will also reach  out to Dr. Johnsie Cancel about stopping Plavix in prep for procedure. 2. CAD - no recent angina. Will continue current regimen and discuss with Dr. Johnsie Cancel regarding ASA/Plavix as above. The patient admits he actually only takes ASA 3-4x a week. 3. Moderate aortic stenosis - will f/u echo as above. No symptoms to suggest critical AS at this time. 4. Essential HTN- controlled. 5. Hyperlipidemia - managed and followed by PCP. 6. Trifascicular block - no symptoms. Would avoid AVN blocking agents in the future. We discussed surveillance of any concerning symptoms including fatigue, dizziness, pre-syncope or syncope.   Disposition: F/u with Dr. Johnsie Cancel in 1 year.  Medication Adjustments/Labs and Tests Ordered: Current  medicines are reviewed at length with the patient today.  Concerns regarding medicines are outlined above. Medication changes, Labs and Tests ordered today are summarized above and listed in the Patient Instructions accessible in Encounters.   Signed, [name], PA-C  05/11/2018 3:03 PM    [facility] [address], [place], [place]  [address] Phone: [phone]; Fax: [phone]

## 2018-05-11 NOTE — Telephone Encounter (Signed)
   Primary Cardiologist: Jenkins Rouge, MD  This message will be routed to GI as an update to pre-op clearance for his upcoming colonoscopy.  The patient was seen in clinic today for pre-op clearance and overdue follow-up. He is noted to have a trifascicular block on EKG with a RBBB + LAFB + 1st degree AVB. He is not on any AVN blocking agents. I reviewed with Dr. Burt Knack today (DOD) in clinic. There is no specific contraindication to proceeding with colonoscopy based on this EKG, however, at baseline he is at increased risk for bradyarrhythmias therefore we would suggest the colonoscopy be performed in hospital setting instead of office-based setting. Before providing finalized clearance, we will obtain a 2D echocardiogram to ensure his aortic stenosis is stable. I will be sure to route to GI once that result comes back, along with recommendation on holding Plavix from Dr. Johnsie Cancel.  [name], PA-C 05/11/2018, 3:37 PM

## 2018-05-11 NOTE — Telephone Encounter (Signed)
duplicate

## 2018-05-11 NOTE — Telephone Encounter (Addendum)
At today's visit the patient reported he was only taking aspirin 3-4x a week. He did endorse taking plavix 75mg  daily. I reviewed meds with Dr. Johnsie Cancel. He inquired why the patient was only taking aspirin 3-4x per week? Mr. Santaana didn't specify any particular reason so please clarify. Dr. Johnsie Cancel feels if he is sensitive to aspirin, he can just take Plavix alone, otherwise would continue meds as prescribed.   If echo looks OK, Dr. Johnsie Cancel feels it would be OK to hold Plavix for 5 days for colonoscopy (but if holding Plavix, should be on aspirin at that time since GI did not specify need to hold aspirin) - we will finalize clearance when echo is back.  Dayna Dunn PA-C

## 2018-05-11 NOTE — Patient Instructions (Addendum)
Medication Instructions:  Your physician recommends that you continue on your current medications as directed. Please refer to the Current Medication list given to you today.   Labwork: None ordered  Testing/Procedures: Your physician has requested that you have an echocardiogram. Echocardiography is a painless test that uses sound waves to create images of your heart. It provides your doctor with information about the size and shape of your heart and how well your heart's chambers and valves are working. This procedure takes approximately one hour. There are no restrictions for this procedure.    Follow-Up: Your physician wants you to follow-up in: [name] will receive a reminder letter in the mail two months in advance. If you don't receive a letter, please call our office to schedule the follow-up appointment.    Any Other Special Instructions Will Be Listed Below (If Applicable).  Echocardiogram An echocardiogram, or echocardiography, uses sound waves (ultrasound) to produce an image of your heart. The echocardiogram is simple, painless, obtained within a short period of time, and offers valuable information to your health care provider. The images from an echocardiogram can provide information such as:  Evidence of coronary artery disease (CAD).  Heart size.  Heart muscle function.  Heart valve function.  Aneurysm detection.  Evidence of a past heart attack.  Fluid buildup around the heart.  Heart muscle thickening.  Assess heart valve function.  Tell a health care provider about:  Any allergies you have.  All medicines you are taking, including vitamins, herbs, eye drops, creams, and over-the-counter medicines.  Any problems you or family members have had with anesthetic medicines.  Any blood disorders you have.  Any surgeries you have had.  Any medical conditions you have.  Whether you are pregnant or may be pregnant. What happens  before the procedure? No special preparation is needed. Eat and drink normally. What happens during the procedure?  In order to produce an image of your heart, gel will be applied to your chest and a wand-like tool (transducer) will be moved over your chest. The gel will help transmit the sound waves from the transducer. The sound waves will harmlessly bounce off your heart to allow the heart images to be captured in real-time motion. These images will then be recorded.  You may need an IV to receive a medicine that improves the quality of the pictures. What happens after the procedure? You may return to your normal schedule including diet, activities, and medicines, unless your health care provider tells you otherwise. This information is not intended to replace advice given to you by your health care provider. Make sure you discuss any questions you have with your health care provider. Document Released: 08/07/2000 Document Revised: 03/28/2016 Document Reviewed: 04/17/2013 Elsevier Interactive Patient Education  2017 [employer].    If you need a refill on your cardiac medications before your next appointment, please call your pharmacy.

## 2018-05-12 NOTE — Telephone Encounter (Signed)
Pt returned my call and he states that he will take the Aspirin every day, that there was no particular reason that he was only taking it 3-4 X's weekly, that he is not sensitive to it or anything. Did explain to pt re: Holding Plavix for his Colonoscopy, but would need to take the Aspirin, but pt understands that that hasn't been finalized as of yet, and when we received his Echocardiogram back, someone will be in touch with him to finalized instructions. Pt thanked me for the call.

## 2018-05-12 NOTE — Telephone Encounter (Signed)
Called pt re: Aspirin. Left a message for pt to call back.

## 2018-05-24 ENCOUNTER — Other Ambulatory Visit (HOSPITAL_COMMUNITY): Payer: Medicare Other

## 2018-05-25 ENCOUNTER — Other Ambulatory Visit: Payer: Self-pay

## 2018-05-25 ENCOUNTER — Ambulatory Visit (HOSPITAL_COMMUNITY): Payer: Medicare Other | Attending: Cardiology

## 2018-05-25 DIAGNOSIS — I350 Nonrheumatic aortic (valve) stenosis: Secondary | ICD-10-CM | POA: Diagnosis not present

## 2018-05-25 DIAGNOSIS — E785 Hyperlipidemia, unspecified: Secondary | ICD-10-CM | POA: Diagnosis not present

## 2018-05-25 DIAGNOSIS — Z01810 Encounter for preprocedural cardiovascular examination: Secondary | ICD-10-CM

## 2018-05-25 DIAGNOSIS — I2510 Atherosclerotic heart disease of native coronary artery without angina pectoris: Secondary | ICD-10-CM | POA: Insufficient documentation

## 2018-05-25 DIAGNOSIS — I453 Trifascicular block: Secondary | ICD-10-CM | POA: Insufficient documentation

## 2018-05-25 DIAGNOSIS — I10 Essential (primary) hypertension: Secondary | ICD-10-CM | POA: Diagnosis not present

## 2018-05-26 ENCOUNTER — Telehealth: Payer: Self-pay

## 2018-05-26 NOTE — Telephone Encounter (Signed)
Left message for patient to call back. Will start a phone note. Per Dr. Johnsie Cancel, Would set up for right and left cath then have him see Dr Roxy Manns or Cyndia Bent .

## 2018-05-26 NOTE — Telephone Encounter (Signed)
-----   Message from Josue Hector, MD sent at 05/26/2018  3:32 PM EDT ----- Would set up for right and left cath then have him see Dr Roxy Manns or Cyndia Bent  ----- Message ----- From: Consuelo Pandy, PA-C Sent: 05/26/2018   1:26 PM EDT To: Josue Hector, MD, Jeanann Lewandowsky, RMA  Ultrasound shows normal pump function, however his aortic valve stenosis is now severe. I recommend he hold off on colonoscopy until further notice. Will route echo report to Dr. Johnsie Cancel for review. He will likely need to be referred to valve clinic to discuss valve replacement.

## 2018-05-27 NOTE — Telephone Encounter (Signed)
Left message for pt to call back  °

## 2018-05-30 ENCOUNTER — Telehealth: Payer: Self-pay

## 2018-05-30 NOTE — Telephone Encounter (Signed)
Left message for patient to call back  

## 2018-05-30 NOTE — Telephone Encounter (Signed)
Have him see me next available

## 2018-05-30 NOTE — Telephone Encounter (Signed)
Offered patient an appointment on 06/09/18 with Dr. Johnsie Cancel. Patient stated he would have to check his schedule and get back with our office. Patient stated he might be too busy to come into the office.

## 2018-05-30 NOTE — Telephone Encounter (Signed)
Called patient about his echo results and the recommendation of setting up for left and right cath and referral to Dr. Roxy Manns or [facility]. Patient stated to tell Dr. Johnsie Cancel " I am not having any surgery or procedure, and I feel fine". Will forward to Dr. Johnsie Cancel.

## 2018-05-30 NOTE — Telephone Encounter (Signed)
Notes recorded by [name], PA-C on 05/30/2018 at 11:54 AM EDT Brittainy was covering for me while I was out. I see that pt wanted to see Dr. Johnsie Cancel first before doing anything else. Pam, can you please call the folks requesting the colonoscopy clearance to make sure they are aware we are recommending he HOLD OFF? Thanks! Dayna Dunn PA-C   Called [facility] and left message about holding off on colonoscopy.

## 2018-06-02 NOTE — Telephone Encounter (Signed)
Patient will call back when he is ready to schedule.

## 2018-06-15 DIAGNOSIS — H903 Sensorineural hearing loss, bilateral: Secondary | ICD-10-CM | POA: Diagnosis not present

## 2018-06-15 DIAGNOSIS — H6122 Impacted cerumen, left ear: Secondary | ICD-10-CM | POA: Diagnosis not present

## 2018-06-15 DIAGNOSIS — H608X2 Other otitis externa, left ear: Secondary | ICD-10-CM | POA: Diagnosis not present

## 2018-08-08 DIAGNOSIS — G250 Essential tremor: Secondary | ICD-10-CM | POA: Diagnosis not present

## 2018-08-08 DIAGNOSIS — E7849 Other hyperlipidemia: Secondary | ICD-10-CM | POA: Diagnosis not present

## 2018-08-08 DIAGNOSIS — I2510 Atherosclerotic heart disease of native coronary artery without angina pectoris: Secondary | ICD-10-CM | POA: Diagnosis not present

## 2018-08-08 DIAGNOSIS — D692 Other nonthrombocytopenic purpura: Secondary | ICD-10-CM | POA: Diagnosis not present

## 2018-08-08 DIAGNOSIS — I129 Hypertensive chronic kidney disease with stage 1 through stage 4 chronic kidney disease, or unspecified chronic kidney disease: Secondary | ICD-10-CM | POA: Diagnosis not present

## 2018-08-08 DIAGNOSIS — I10 Essential (primary) hypertension: Secondary | ICD-10-CM | POA: Diagnosis not present

## 2019-02-13 DIAGNOSIS — I10 Essential (primary) hypertension: Secondary | ICD-10-CM | POA: Diagnosis not present

## 2019-02-13 DIAGNOSIS — Z125 Encounter for screening for malignant neoplasm of prostate: Secondary | ICD-10-CM | POA: Diagnosis not present

## 2019-02-13 DIAGNOSIS — R82998 Other abnormal findings in urine: Secondary | ICD-10-CM | POA: Diagnosis not present

## 2019-02-20 DIAGNOSIS — Z1339 Encounter for screening examination for other mental health and behavioral disorders: Secondary | ICD-10-CM | POA: Diagnosis not present

## 2019-02-20 DIAGNOSIS — E785 Hyperlipidemia, unspecified: Secondary | ICD-10-CM | POA: Diagnosis not present

## 2019-02-20 DIAGNOSIS — H9190 Unspecified hearing loss, unspecified ear: Secondary | ICD-10-CM | POA: Diagnosis not present

## 2019-02-20 DIAGNOSIS — G250 Essential tremor: Secondary | ICD-10-CM | POA: Diagnosis not present

## 2019-02-20 DIAGNOSIS — I2510 Atherosclerotic heart disease of native coronary artery without angina pectoris: Secondary | ICD-10-CM | POA: Diagnosis not present

## 2019-02-20 DIAGNOSIS — D692 Other nonthrombocytopenic purpura: Secondary | ICD-10-CM | POA: Diagnosis not present

## 2019-02-20 DIAGNOSIS — N183 Chronic kidney disease, stage 3 (moderate): Secondary | ICD-10-CM | POA: Diagnosis not present

## 2019-02-20 DIAGNOSIS — L570 Actinic keratosis: Secondary | ICD-10-CM | POA: Diagnosis not present

## 2019-02-20 DIAGNOSIS — Z0000 Encounter for general adult medical examination without abnormal findings: Secondary | ICD-10-CM | POA: Diagnosis not present

## 2019-02-20 DIAGNOSIS — Z1331 Encounter for screening for depression: Secondary | ICD-10-CM | POA: Diagnosis not present

## 2019-02-20 DIAGNOSIS — Z8546 Personal history of malignant neoplasm of prostate: Secondary | ICD-10-CM | POA: Diagnosis not present

## 2019-02-20 DIAGNOSIS — I129 Hypertensive chronic kidney disease with stage 1 through stage 4 chronic kidney disease, or unspecified chronic kidney disease: Secondary | ICD-10-CM | POA: Diagnosis not present

## 2019-02-20 DIAGNOSIS — I10 Essential (primary) hypertension: Secondary | ICD-10-CM | POA: Diagnosis not present

## 2019-02-20 DIAGNOSIS — F39 Unspecified mood [affective] disorder: Secondary | ICD-10-CM | POA: Diagnosis not present

## 2019-03-27 DIAGNOSIS — H43813 Vitreous degeneration, bilateral: Secondary | ICD-10-CM | POA: Diagnosis not present

## 2019-03-27 DIAGNOSIS — Z961 Presence of intraocular lens: Secondary | ICD-10-CM | POA: Diagnosis not present

## 2019-03-27 DIAGNOSIS — H52203 Unspecified astigmatism, bilateral: Secondary | ICD-10-CM | POA: Diagnosis not present

## 2019-03-30 DIAGNOSIS — C44622 Squamous cell carcinoma of skin of right upper limb, including shoulder: Secondary | ICD-10-CM | POA: Diagnosis not present

## 2019-04-10 DIAGNOSIS — C44622 Squamous cell carcinoma of skin of right upper limb, including shoulder: Secondary | ICD-10-CM | POA: Diagnosis not present

## 2019-05-29 DIAGNOSIS — G250 Essential tremor: Secondary | ICD-10-CM | POA: Diagnosis not present

## 2019-05-29 DIAGNOSIS — I2510 Atherosclerotic heart disease of native coronary artery without angina pectoris: Secondary | ICD-10-CM | POA: Diagnosis not present

## 2019-05-29 DIAGNOSIS — E7849 Other hyperlipidemia: Secondary | ICD-10-CM | POA: Diagnosis not present

## 2019-05-29 DIAGNOSIS — I10 Essential (primary) hypertension: Secondary | ICD-10-CM | POA: Diagnosis not present

## 2019-05-29 DIAGNOSIS — F39 Unspecified mood [affective] disorder: Secondary | ICD-10-CM | POA: Diagnosis not present

## 2019-05-29 DIAGNOSIS — H9190 Unspecified hearing loss, unspecified ear: Secondary | ICD-10-CM | POA: Diagnosis not present

## 2019-05-29 DIAGNOSIS — D692 Other nonthrombocytopenic purpura: Secondary | ICD-10-CM | POA: Diagnosis not present

## 2019-05-29 DIAGNOSIS — L570 Actinic keratosis: Secondary | ICD-10-CM | POA: Diagnosis not present

## 2019-05-29 DIAGNOSIS — I129 Hypertensive chronic kidney disease with stage 1 through stage 4 chronic kidney disease, or unspecified chronic kidney disease: Secondary | ICD-10-CM | POA: Diagnosis not present

## 2019-05-29 DIAGNOSIS — N183 Chronic kidney disease, stage 3 unspecified: Secondary | ICD-10-CM | POA: Diagnosis not present

## 2019-05-29 DIAGNOSIS — R002 Palpitations: Secondary | ICD-10-CM | POA: Diagnosis not present

## 2019-05-29 DIAGNOSIS — E663 Overweight: Secondary | ICD-10-CM | POA: Diagnosis not present

## 2019-08-04 DIAGNOSIS — D485 Neoplasm of uncertain behavior of skin: Secondary | ICD-10-CM | POA: Diagnosis not present

## 2019-08-04 DIAGNOSIS — Z85828 Personal history of other malignant neoplasm of skin: Secondary | ICD-10-CM | POA: Diagnosis not present

## 2019-08-04 DIAGNOSIS — L821 Other seborrheic keratosis: Secondary | ICD-10-CM | POA: Diagnosis not present

## 2019-08-04 DIAGNOSIS — D1801 Hemangioma of skin and subcutaneous tissue: Secondary | ICD-10-CM | POA: Diagnosis not present

## 2019-08-04 DIAGNOSIS — D225 Melanocytic nevi of trunk: Secondary | ICD-10-CM | POA: Diagnosis not present

## 2019-08-28 DIAGNOSIS — I10 Essential (primary) hypertension: Secondary | ICD-10-CM | POA: Diagnosis not present

## 2019-08-28 DIAGNOSIS — E785 Hyperlipidemia, unspecified: Secondary | ICD-10-CM | POA: Diagnosis not present

## 2019-08-28 DIAGNOSIS — I2510 Atherosclerotic heart disease of native coronary artery without angina pectoris: Secondary | ICD-10-CM | POA: Diagnosis not present

## 2019-08-28 DIAGNOSIS — I129 Hypertensive chronic kidney disease with stage 1 through stage 4 chronic kidney disease, or unspecified chronic kidney disease: Secondary | ICD-10-CM | POA: Diagnosis not present

## 2019-08-28 DIAGNOSIS — N1831 Chronic kidney disease, stage 3a: Secondary | ICD-10-CM | POA: Diagnosis not present

## 2019-09-13 DIAGNOSIS — I129 Hypertensive chronic kidney disease with stage 1 through stage 4 chronic kidney disease, or unspecified chronic kidney disease: Secondary | ICD-10-CM | POA: Diagnosis not present

## 2019-09-13 DIAGNOSIS — N1831 Chronic kidney disease, stage 3a: Secondary | ICD-10-CM | POA: Diagnosis not present

## 2019-11-14 DIAGNOSIS — H93293 Other abnormal auditory perceptions, bilateral: Secondary | ICD-10-CM | POA: Diagnosis not present

## 2019-11-14 DIAGNOSIS — H6123 Impacted cerumen, bilateral: Secondary | ICD-10-CM | POA: Diagnosis not present

## 2020-03-11 DIAGNOSIS — E7849 Other hyperlipidemia: Secondary | ICD-10-CM | POA: Diagnosis not present

## 2020-03-11 DIAGNOSIS — Z125 Encounter for screening for malignant neoplasm of prostate: Secondary | ICD-10-CM | POA: Diagnosis not present

## 2020-03-18 DIAGNOSIS — G250 Essential tremor: Secondary | ICD-10-CM | POA: Diagnosis not present

## 2020-03-18 DIAGNOSIS — Z1331 Encounter for screening for depression: Secondary | ICD-10-CM | POA: Diagnosis not present

## 2020-03-18 DIAGNOSIS — R82998 Other abnormal findings in urine: Secondary | ICD-10-CM | POA: Diagnosis not present

## 2020-03-18 DIAGNOSIS — Z8546 Personal history of malignant neoplasm of prostate: Secondary | ICD-10-CM | POA: Diagnosis not present

## 2020-03-18 DIAGNOSIS — R002 Palpitations: Secondary | ICD-10-CM | POA: Diagnosis not present

## 2020-03-18 DIAGNOSIS — D692 Other nonthrombocytopenic purpura: Secondary | ICD-10-CM | POA: Diagnosis not present

## 2020-03-18 DIAGNOSIS — I2510 Atherosclerotic heart disease of native coronary artery without angina pectoris: Secondary | ICD-10-CM | POA: Diagnosis not present

## 2020-03-18 DIAGNOSIS — I129 Hypertensive chronic kidney disease with stage 1 through stage 4 chronic kidney disease, or unspecified chronic kidney disease: Secondary | ICD-10-CM | POA: Diagnosis not present

## 2020-03-18 DIAGNOSIS — N1832 Chronic kidney disease, stage 3b: Secondary | ICD-10-CM | POA: Diagnosis not present

## 2020-03-18 DIAGNOSIS — E785 Hyperlipidemia, unspecified: Secondary | ICD-10-CM | POA: Diagnosis not present

## 2020-03-18 DIAGNOSIS — Z1339 Encounter for screening examination for other mental health and behavioral disorders: Secondary | ICD-10-CM | POA: Diagnosis not present

## 2020-03-18 DIAGNOSIS — Z0000 Encounter for general adult medical examination without abnormal findings: Secondary | ICD-10-CM | POA: Diagnosis not present

## 2020-03-18 DIAGNOSIS — E663 Overweight: Secondary | ICD-10-CM | POA: Diagnosis not present

## 2020-03-28 DIAGNOSIS — Z961 Presence of intraocular lens: Secondary | ICD-10-CM | POA: Diagnosis not present

## 2020-03-28 DIAGNOSIS — H52203 Unspecified astigmatism, bilateral: Secondary | ICD-10-CM | POA: Diagnosis not present

## 2020-03-28 DIAGNOSIS — H04123 Dry eye syndrome of bilateral lacrimal glands: Secondary | ICD-10-CM | POA: Diagnosis not present

## 2020-03-28 DIAGNOSIS — H0100A Unspecified blepharitis right eye, upper and lower eyelids: Secondary | ICD-10-CM | POA: Diagnosis not present

## 2020-04-08 DIAGNOSIS — N1832 Chronic kidney disease, stage 3b: Secondary | ICD-10-CM | POA: Diagnosis not present

## 2020-04-08 DIAGNOSIS — I129 Hypertensive chronic kidney disease with stage 1 through stage 4 chronic kidney disease, or unspecified chronic kidney disease: Secondary | ICD-10-CM | POA: Diagnosis not present

## 2020-04-08 DIAGNOSIS — R05 Cough: Secondary | ICD-10-CM | POA: Diagnosis not present

## 2020-04-08 DIAGNOSIS — R0600 Dyspnea, unspecified: Secondary | ICD-10-CM | POA: Diagnosis not present

## 2020-04-24 DEATH — deceased

## 2020-05-02 DIAGNOSIS — M47812 Spondylosis without myelopathy or radiculopathy, cervical region: Secondary | ICD-10-CM | POA: Diagnosis not present

## 2020-05-04 DIAGNOSIS — R05 Cough: Secondary | ICD-10-CM | POA: Diagnosis not present

## 2020-05-04 DIAGNOSIS — J069 Acute upper respiratory infection, unspecified: Secondary | ICD-10-CM | POA: Diagnosis not present

## 2020-05-05 ENCOUNTER — Other Ambulatory Visit: Payer: Self-pay

## 2020-05-05 ENCOUNTER — Emergency Department (HOSPITAL_COMMUNITY)
Admission: EM | Admit: 2020-05-05 | Discharge: 2020-05-05 | Disposition: A | Discharge status: Home / Self Care | Payer: Medicare Other | Source: Home / Self Care

## 2020-05-05 ENCOUNTER — Encounter (HOSPITAL_COMMUNITY): Payer: Self-pay | Admitting: Emergency Medicine

## 2020-05-05 DIAGNOSIS — J80 Acute respiratory distress syndrome: Secondary | ICD-10-CM | POA: Diagnosis not present

## 2020-05-05 DIAGNOSIS — Z5321 Procedure and treatment not carried out due to patient leaving prior to being seen by health care provider: Secondary | ICD-10-CM | POA: Insufficient documentation

## 2020-05-05 DIAGNOSIS — N179 Acute kidney failure, unspecified: Secondary | ICD-10-CM | POA: Diagnosis not present

## 2020-05-05 DIAGNOSIS — Z66 Do not resuscitate: Secondary | ICD-10-CM | POA: Diagnosis not present

## 2020-05-05 DIAGNOSIS — I5043 Acute on chronic combined systolic (congestive) and diastolic (congestive) heart failure: Secondary | ICD-10-CM | POA: Diagnosis not present

## 2020-05-05 DIAGNOSIS — R4702 Dysphasia: Secondary | ICD-10-CM | POA: Diagnosis not present

## 2020-05-05 DIAGNOSIS — R627 Adult failure to thrive: Secondary | ICD-10-CM | POA: Insufficient documentation

## 2020-05-05 DIAGNOSIS — R0602 Shortness of breath: Secondary | ICD-10-CM | POA: Diagnosis not present

## 2020-05-05 DIAGNOSIS — M7989 Other specified soft tissue disorders: Secondary | ICD-10-CM | POA: Diagnosis not present

## 2020-05-05 DIAGNOSIS — Z20822 Contact with and (suspected) exposure to covid-19: Secondary | ICD-10-CM | POA: Diagnosis not present

## 2020-05-05 DIAGNOSIS — I4891 Unspecified atrial fibrillation: Secondary | ICD-10-CM | POA: Diagnosis not present

## 2020-05-05 DIAGNOSIS — R7989 Other specified abnormal findings of blood chemistry: Secondary | ICD-10-CM | POA: Diagnosis not present

## 2020-05-05 DIAGNOSIS — R0902 Hypoxemia: Secondary | ICD-10-CM | POA: Diagnosis not present

## 2020-05-05 DIAGNOSIS — Z515 Encounter for palliative care: Secondary | ICD-10-CM | POA: Diagnosis not present

## 2020-05-05 DIAGNOSIS — R778 Other specified abnormalities of plasma proteins: Secondary | ICD-10-CM | POA: Diagnosis not present

## 2020-05-05 DIAGNOSIS — I130 Hypertensive heart and chronic kidney disease with heart failure and stage 1 through stage 4 chronic kidney disease, or unspecified chronic kidney disease: Secondary | ICD-10-CM | POA: Diagnosis not present

## 2020-05-05 DIAGNOSIS — R5383 Other fatigue: Secondary | ICD-10-CM | POA: Diagnosis not present

## 2020-05-05 DIAGNOSIS — R05 Cough: Secondary | ICD-10-CM | POA: Diagnosis not present

## 2020-05-05 DIAGNOSIS — R531 Weakness: Secondary | ICD-10-CM | POA: Insufficient documentation

## 2020-05-05 LAB — CBC
HCT: 51 % (ref 39.0–52.0)
Hemoglobin: 16.1 g/dL (ref 13.0–17.0)
MCH: 28.5 pg (ref 26.0–34.0)
MCHC: 31.6 g/dL (ref 30.0–36.0)
MCV: 90.4 fL (ref 80.0–100.0)
Platelets: 262 10*3/uL (ref 150–400)
RBC: 5.64 MIL/uL (ref 4.22–5.81)
RDW: 15.3 % (ref 11.5–15.5)
WBC: 8.4 10*3/uL (ref 4.0–10.5)
nRBC: 0 % (ref 0.0–0.2)

## 2020-05-05 LAB — BASIC METABOLIC PANEL
Anion gap: 15 (ref 5–15)
BUN: 77 mg/dL — ABNORMAL HIGH (ref 8–23)
CO2: 18 mmol/L — ABNORMAL LOW (ref 22–32)
Calcium: 9.3 mg/dL (ref 8.9–10.3)
Chloride: 104 mmol/L (ref 98–111)
Creatinine, Ser: 2.42 mg/dL — ABNORMAL HIGH (ref 0.61–1.24)
GFR calc Af Amer: 27 mL/min — ABNORMAL LOW (ref >60)
GFR calc non Af Amer: 23 mL/min — ABNORMAL LOW (ref >60)
Glucose, Bld: 116 mg/dL — ABNORMAL HIGH (ref 70–99)
Potassium: 5.4 mmol/L — ABNORMAL HIGH (ref 3.5–5.1)
Sodium: 137 mmol/L (ref 135–145)

## 2020-05-05 NOTE — ED Triage Notes (Signed)
Per [facility], pt from home, family called EMS b/c he has had a failure to thrive and generally deterated in the past three months (since wife died).  Pt has had low intake, low energy and generally weak.  He is on a blood thinner and told RN he fell two weeks ago but did not hit his head. Pt is alert and oriented.

## 2020-05-05 NOTE — ED Notes (Signed)
Pt called a taxi, this NT tried to encourage him to stay but he said he was not waiting any longer.

## 2020-05-06 ENCOUNTER — Emergency Department (HOSPITAL_COMMUNITY): Payer: Medicare Other

## 2020-05-06 ENCOUNTER — Other Ambulatory Visit: Payer: Self-pay

## 2020-05-06 ENCOUNTER — Encounter (HOSPITAL_COMMUNITY): Payer: Self-pay

## 2020-05-06 ENCOUNTER — Other Ambulatory Visit (HOSPITAL_COMMUNITY): Payer: Medicare Other

## 2020-05-06 ENCOUNTER — Inpatient Hospital Stay (HOSPITAL_COMMUNITY)
Admission: EM | Admit: 2020-05-06 | Discharge: 2020-05-24 | DRG: 291 | Disposition: E | Payer: Medicare Other | Attending: Internal Medicine | Admitting: Internal Medicine

## 2020-05-06 DIAGNOSIS — R059 Cough, unspecified: Secondary | ICD-10-CM

## 2020-05-06 DIAGNOSIS — E782 Mixed hyperlipidemia: Secondary | ICD-10-CM | POA: Diagnosis present

## 2020-05-06 DIAGNOSIS — I5023 Acute on chronic systolic (congestive) heart failure: Secondary | ICD-10-CM | POA: Diagnosis not present

## 2020-05-06 DIAGNOSIS — M7989 Other specified soft tissue disorders: Secondary | ICD-10-CM | POA: Diagnosis not present

## 2020-05-06 DIAGNOSIS — Z85828 Personal history of other malignant neoplasm of skin: Secondary | ICD-10-CM

## 2020-05-06 DIAGNOSIS — E872 Acidosis: Secondary | ICD-10-CM | POA: Diagnosis present

## 2020-05-06 DIAGNOSIS — I361 Nonrheumatic tricuspid (valve) insufficiency: Secondary | ICD-10-CM | POA: Diagnosis not present

## 2020-05-06 DIAGNOSIS — K761 Chronic passive congestion of liver: Secondary | ICD-10-CM | POA: Diagnosis present

## 2020-05-06 DIAGNOSIS — I5043 Acute on chronic combined systolic (congestive) and diastolic (congestive) heart failure: Secondary | ICD-10-CM | POA: Diagnosis present

## 2020-05-06 DIAGNOSIS — I5021 Acute systolic (congestive) heart failure: Secondary | ICD-10-CM

## 2020-05-06 DIAGNOSIS — N1832 Chronic kidney disease, stage 3b: Secondary | ICD-10-CM | POA: Diagnosis present

## 2020-05-06 DIAGNOSIS — I2510 Atherosclerotic heart disease of native coronary artery without angina pectoris: Secondary | ICD-10-CM | POA: Diagnosis present

## 2020-05-06 DIAGNOSIS — I43 Cardiomyopathy in diseases classified elsewhere: Secondary | ICD-10-CM | POA: Diagnosis present

## 2020-05-06 DIAGNOSIS — R570 Cardiogenic shock: Secondary | ICD-10-CM | POA: Diagnosis not present

## 2020-05-06 DIAGNOSIS — R531 Weakness: Secondary | ICD-10-CM | POA: Diagnosis not present

## 2020-05-06 DIAGNOSIS — R5383 Other fatigue: Secondary | ICD-10-CM | POA: Diagnosis not present

## 2020-05-06 DIAGNOSIS — E875 Hyperkalemia: Secondary | ICD-10-CM | POA: Diagnosis present

## 2020-05-06 DIAGNOSIS — E854 Organ-limited amyloidosis: Secondary | ICD-10-CM | POA: Diagnosis not present

## 2020-05-06 DIAGNOSIS — Z9079 Acquired absence of other genital organ(s): Secondary | ICD-10-CM

## 2020-05-06 DIAGNOSIS — I517 Cardiomegaly: Secondary | ICD-10-CM | POA: Diagnosis not present

## 2020-05-06 DIAGNOSIS — M48061 Spinal stenosis, lumbar region without neurogenic claudication: Secondary | ICD-10-CM | POA: Diagnosis present

## 2020-05-06 DIAGNOSIS — R0902 Hypoxemia: Secondary | ICD-10-CM | POA: Diagnosis not present

## 2020-05-06 DIAGNOSIS — Z66 Do not resuscitate: Secondary | ICD-10-CM | POA: Diagnosis present

## 2020-05-06 DIAGNOSIS — L8990 Pressure ulcer of unspecified site, unspecified stage: Secondary | ICD-10-CM | POA: Insufficient documentation

## 2020-05-06 DIAGNOSIS — J449 Chronic obstructive pulmonary disease, unspecified: Secondary | ICD-10-CM

## 2020-05-06 DIAGNOSIS — W19XXXA Unspecified fall, initial encounter: Secondary | ICD-10-CM | POA: Diagnosis present

## 2020-05-06 DIAGNOSIS — I5041 Acute combined systolic (congestive) and diastolic (congestive) heart failure: Secondary | ICD-10-CM | POA: Diagnosis not present

## 2020-05-06 DIAGNOSIS — Z20822 Contact with and (suspected) exposure to covid-19: Secondary | ICD-10-CM | POA: Diagnosis not present

## 2020-05-06 DIAGNOSIS — Z515 Encounter for palliative care: Secondary | ICD-10-CM | POA: Diagnosis not present

## 2020-05-06 DIAGNOSIS — F4320 Adjustment disorder, unspecified: Secondary | ICD-10-CM

## 2020-05-06 DIAGNOSIS — Z452 Encounter for adjustment and management of vascular access device: Secondary | ICD-10-CM

## 2020-05-06 DIAGNOSIS — D6859 Other primary thrombophilia: Secondary | ICD-10-CM | POA: Diagnosis present

## 2020-05-06 DIAGNOSIS — I447 Left bundle-branch block, unspecified: Secondary | ICD-10-CM | POA: Diagnosis not present

## 2020-05-06 DIAGNOSIS — B370 Candidal stomatitis: Secondary | ICD-10-CM

## 2020-05-06 DIAGNOSIS — I440 Atrioventricular block, first degree: Secondary | ICD-10-CM | POA: Diagnosis not present

## 2020-05-06 DIAGNOSIS — R778 Other specified abnormalities of plasma proteins: Secondary | ICD-10-CM | POA: Diagnosis present

## 2020-05-06 DIAGNOSIS — E876 Hypokalemia: Secondary | ICD-10-CM | POA: Diagnosis not present

## 2020-05-06 DIAGNOSIS — E860 Dehydration: Secondary | ICD-10-CM | POA: Diagnosis present

## 2020-05-06 DIAGNOSIS — R0602 Shortness of breath: Secondary | ICD-10-CM | POA: Diagnosis not present

## 2020-05-06 DIAGNOSIS — I499 Cardiac arrhythmia, unspecified: Secondary | ICD-10-CM

## 2020-05-06 DIAGNOSIS — J811 Chronic pulmonary edema: Secondary | ICD-10-CM | POA: Diagnosis not present

## 2020-05-06 DIAGNOSIS — R7989 Other specified abnormal findings of blood chemistry: Secondary | ICD-10-CM | POA: Diagnosis present

## 2020-05-06 DIAGNOSIS — Z8719 Personal history of other diseases of the digestive system: Secondary | ICD-10-CM

## 2020-05-06 DIAGNOSIS — I453 Trifascicular block: Secondary | ICD-10-CM | POA: Diagnosis present

## 2020-05-06 DIAGNOSIS — I350 Nonrheumatic aortic (valve) stenosis: Secondary | ICD-10-CM

## 2020-05-06 DIAGNOSIS — Z6824 Body mass index (BMI) 24.0-24.9, adult: Secondary | ICD-10-CM

## 2020-05-06 DIAGNOSIS — I472 Ventricular tachycardia: Secondary | ICD-10-CM | POA: Diagnosis present

## 2020-05-06 DIAGNOSIS — S40021A Contusion of right upper arm, initial encounter: Secondary | ICD-10-CM | POA: Diagnosis present

## 2020-05-06 DIAGNOSIS — I441 Atrioventricular block, second degree: Secondary | ICD-10-CM | POA: Diagnosis not present

## 2020-05-06 DIAGNOSIS — K649 Unspecified hemorrhoids: Secondary | ICD-10-CM | POA: Diagnosis present

## 2020-05-06 DIAGNOSIS — I351 Nonrheumatic aortic (valve) insufficiency: Secondary | ICD-10-CM | POA: Diagnosis not present

## 2020-05-06 DIAGNOSIS — N529 Male erectile dysfunction, unspecified: Secondary | ICD-10-CM | POA: Diagnosis present

## 2020-05-06 DIAGNOSIS — I4891 Unspecified atrial fibrillation: Secondary | ICD-10-CM

## 2020-05-06 DIAGNOSIS — I509 Heart failure, unspecified: Secondary | ICD-10-CM

## 2020-05-06 DIAGNOSIS — Z7189 Other specified counseling: Secondary | ICD-10-CM

## 2020-05-06 DIAGNOSIS — Z8546 Personal history of malignant neoplasm of prostate: Secondary | ICD-10-CM

## 2020-05-06 DIAGNOSIS — R05 Cough: Secondary | ICD-10-CM | POA: Diagnosis not present

## 2020-05-06 DIAGNOSIS — K5790 Diverticulosis of intestine, part unspecified, without perforation or abscess without bleeding: Secondary | ICD-10-CM | POA: Diagnosis present

## 2020-05-06 DIAGNOSIS — Z9181 History of falling: Secondary | ICD-10-CM

## 2020-05-06 DIAGNOSIS — F4321 Adjustment disorder with depressed mood: Secondary | ICD-10-CM | POA: Diagnosis not present

## 2020-05-06 DIAGNOSIS — N179 Acute kidney failure, unspecified: Secondary | ICD-10-CM | POA: Diagnosis not present

## 2020-05-06 DIAGNOSIS — I130 Hypertensive heart and chronic kidney disease with heart failure and stage 1 through stage 4 chronic kidney disease, or unspecified chronic kidney disease: Secondary | ICD-10-CM | POA: Diagnosis not present

## 2020-05-06 DIAGNOSIS — Z8249 Family history of ischemic heart disease and other diseases of the circulatory system: Secondary | ICD-10-CM

## 2020-05-06 DIAGNOSIS — Z7952 Long term (current) use of systemic steroids: Secondary | ICD-10-CM

## 2020-05-06 DIAGNOSIS — Z7951 Long term (current) use of inhaled steroids: Secondary | ICD-10-CM

## 2020-05-06 DIAGNOSIS — Z7982 Long term (current) use of aspirin: Secondary | ICD-10-CM

## 2020-05-06 DIAGNOSIS — J90 Pleural effusion, not elsewhere classified: Secondary | ICD-10-CM | POA: Diagnosis not present

## 2020-05-06 DIAGNOSIS — R627 Adult failure to thrive: Secondary | ICD-10-CM | POA: Diagnosis present

## 2020-05-06 DIAGNOSIS — J9811 Atelectasis: Secondary | ICD-10-CM | POA: Diagnosis not present

## 2020-05-06 DIAGNOSIS — F329 Major depressive disorder, single episode, unspecified: Secondary | ICD-10-CM | POA: Diagnosis present

## 2020-05-06 DIAGNOSIS — I340 Nonrheumatic mitral (valve) insufficiency: Secondary | ICD-10-CM | POA: Diagnosis not present

## 2020-05-06 DIAGNOSIS — R0689 Other abnormalities of breathing: Secondary | ICD-10-CM | POA: Diagnosis not present

## 2020-05-06 DIAGNOSIS — I10 Essential (primary) hypertension: Secondary | ICD-10-CM | POA: Diagnosis present

## 2020-05-06 DIAGNOSIS — R54 Age-related physical debility: Secondary | ICD-10-CM | POA: Diagnosis present

## 2020-05-06 DIAGNOSIS — T730XXA Starvation, initial encounter: Secondary | ICD-10-CM | POA: Diagnosis present

## 2020-05-06 DIAGNOSIS — Z955 Presence of coronary angioplasty implant and graft: Secondary | ICD-10-CM

## 2020-05-06 DIAGNOSIS — R0609 Other forms of dyspnea: Secondary | ICD-10-CM

## 2020-05-06 LAB — CBC
HCT: 44.6 % (ref 39.0–52.0)
Hemoglobin: 14.6 g/dL (ref 13.0–17.0)
MCH: 29.5 pg (ref 26.0–34.0)
MCHC: 32.7 g/dL (ref 30.0–36.0)
MCV: 90.1 fL (ref 80.0–100.0)
Platelets: 229 10*3/uL (ref 150–400)
RBC: 4.95 MIL/uL (ref 4.22–5.81)
RDW: 15.9 % — ABNORMAL HIGH (ref 11.5–15.5)
WBC: 9.4 10*3/uL (ref 4.0–10.5)
nRBC: 0 % (ref 0.0–0.2)

## 2020-05-06 LAB — COMPREHENSIVE METABOLIC PANEL
ALT: 52 U/L — ABNORMAL HIGH (ref 0–44)
AST: 50 U/L — ABNORMAL HIGH (ref 15–41)
Albumin: 3.2 g/dL — ABNORMAL LOW (ref 3.5–5.0)
Alkaline Phosphatase: 155 U/L — ABNORMAL HIGH (ref 38–126)
Anion gap: 16 — ABNORMAL HIGH (ref 5–15)
BUN: 84 mg/dL — ABNORMAL HIGH (ref 8–23)
CO2: 16 mmol/L — ABNORMAL LOW (ref 22–32)
Calcium: 8.6 mg/dL — ABNORMAL LOW (ref 8.9–10.3)
Chloride: 106 mmol/L (ref 98–111)
Creatinine, Ser: 2.07 mg/dL — ABNORMAL HIGH (ref 0.61–1.24)
GFR calc Af Amer: 33 mL/min — ABNORMAL LOW (ref >60)
GFR calc non Af Amer: 28 mL/min — ABNORMAL LOW (ref >60)
Glucose, Bld: 120 mg/dL — ABNORMAL HIGH (ref 70–99)
Potassium: 4.8 mmol/L (ref 3.5–5.1)
Sodium: 138 mmol/L (ref 135–145)
Total Bilirubin: 2.1 mg/dL — ABNORMAL HIGH (ref 0.3–1.2)
Total Protein: 6.1 g/dL — ABNORMAL LOW (ref 6.5–8.1)

## 2020-05-06 LAB — URINALYSIS, ROUTINE W REFLEX MICROSCOPIC
Bilirubin Urine: NEGATIVE
Glucose, UA: NEGATIVE mg/dL
Hgb urine dipstick: NEGATIVE
Ketones, ur: NEGATIVE mg/dL
Leukocytes,Ua: NEGATIVE
Nitrite: NEGATIVE
Protein, ur: 30 mg/dL — AB
Specific Gravity, Urine: 1.016 (ref 1.005–1.030)
pH: 5 (ref 5.0–8.0)

## 2020-05-06 LAB — TROPONIN I (HIGH SENSITIVITY)
Troponin I (High Sensitivity): 171 ng/L (ref <18)
Troponin I (High Sensitivity): 153 ng/L (ref <18)
Troponin I (High Sensitivity): 147 ng/L (ref <18)
Troponin I (High Sensitivity): 148 ng/L (ref <18)

## 2020-05-06 LAB — TSH: TSH: 3.114 u[IU]/mL (ref 0.350–4.500)

## 2020-05-06 LAB — BRAIN NATRIURETIC PEPTIDE: B Natriuretic Peptide: 3817 pg/mL — ABNORMAL HIGH (ref 0.0–100.0)

## 2020-05-06 LAB — SARS CORONAVIRUS 2 BY RT PCR (HOSPITAL ORDER, PERFORMED IN ~~LOC~~ HOSPITAL LAB): SARS Coronavirus 2: NEGATIVE

## 2020-05-06 MED ORDER — OMEGA-3 FATTY ACIDS 1000 MG PO CAPS: 1.0000 g | ORAL_CAPSULE | Freq: Every day | ORAL | Status: DC

## 2020-05-06 MED ORDER — FUROSEMIDE 10 MG/ML IJ SOLN
40.0000 mg | Freq: Once | INTRAMUSCULAR | Status: AC
  Administered 2020-05-06: 40 mg via INTRAVENOUS
  Filled 2020-05-06: qty 4

## 2020-05-06 MED ORDER — OMEGA-3-ACID ETHYL ESTERS 1 G PO CAPS
1.0000 g | ORAL_CAPSULE | Freq: Every day | ORAL | Status: DC
  Administered 2020-05-07 – 2020-05-15 (×9): 1 g via ORAL
  Filled 2020-05-06 (×9): qty 1

## 2020-05-06 MED ORDER — ASPIRIN 81 MG PO CHEW
324.0000 mg | CHEWABLE_TABLET | Freq: Once | ORAL | Status: AC
  Administered 2020-05-06: 324 mg via ORAL
  Filled 2020-05-06: qty 4

## 2020-05-06 MED ORDER — SODIUM CHLORIDE 0.9% FLUSH
3.0000 mL | Freq: Two times a day (BID) | INTRAVENOUS | Status: DC
  Administered 2020-05-06 – 2020-05-15 (×13): 3 mL via INTRAVENOUS

## 2020-05-06 MED ORDER — SODIUM CHLORIDE 0.9 % IV SOLN: 250.0000 mL | INTRAVENOUS | Status: DC | PRN

## 2020-05-06 MED ORDER — ASPIRIN EC 81 MG PO TBEC
81.0000 mg | DELAYED_RELEASE_TABLET | Freq: Every day | ORAL | Status: DC
  Administered 2020-05-07 – 2020-05-15 (×9): 81 mg via ORAL
  Filled 2020-05-06 (×9): qty 1

## 2020-05-06 MED ORDER — ACETAMINOPHEN 325 MG PO TABS
650.0000 mg | ORAL_TABLET | Freq: Four times a day (QID) | ORAL | Status: DC | PRN
  Administered 2020-05-07 – 2020-05-14 (×7): 650 mg via ORAL
  Filled 2020-05-06 (×7): qty 2

## 2020-05-06 MED ORDER — IPRATROPIUM-ALBUTEROL 0.5-2.5 (3) MG/3ML IN SOLN: 3.0000 mL | Freq: Four times a day (QID) | RESPIRATORY_TRACT | Status: DC | PRN

## 2020-05-06 MED ORDER — ENOXAPARIN SODIUM 30 MG/0.3ML ~~LOC~~ SOLN
30.0000 mg | SUBCUTANEOUS | Status: DC
  Administered 2020-05-06 – 2020-05-12 (×6): 30 mg via SUBCUTANEOUS
  Filled 2020-05-06 (×6): qty 0.3

## 2020-05-06 MED ORDER — SODIUM CHLORIDE 0.9% FLUSH
3.0000 mL | INTRAVENOUS | Status: DC | PRN
  Administered 2020-05-10: 3 mL via INTRAVENOUS

## 2020-05-06 MED ORDER — ACETAMINOPHEN 650 MG RE SUPP: 650.0000 mg | Freq: Four times a day (QID) | RECTAL | Status: DC | PRN

## 2020-05-06 MED ORDER — ONDANSETRON HCL 4 MG PO TABS: 4.0000 mg | ORAL_TABLET | Freq: Four times a day (QID) | ORAL | Status: DC | PRN

## 2020-05-06 MED ORDER — ONDANSETRON HCL 4 MG/2ML IJ SOLN
4.0000 mg | Freq: Four times a day (QID) | INTRAMUSCULAR | Status: DC | PRN
  Administered 2020-05-09: 4 mg via INTRAVENOUS
  Filled 2020-05-06: qty 2

## 2020-05-06 MED ORDER — HYDROCORTISONE ACETATE 25 MG RE SUPP
25.0000 mg | Freq: Once | RECTAL | Status: AC
  Administered 2020-05-06: 25 mg via RECTAL
  Filled 2020-05-06: qty 1

## 2020-05-06 MED ORDER — MOMETASONE FURO-FORMOTEROL FUM 200-5 MCG/ACT IN AERO
2.0000 | INHALATION_SPRAY | Freq: Two times a day (BID) | RESPIRATORY_TRACT | Status: DC
  Administered 2020-05-06 – 2020-05-15 (×15): 2 via RESPIRATORY_TRACT
  Filled 2020-05-06: qty 8.8

## 2020-05-06 MED ORDER — ASPIRIN 81 MG PO TABS: 81.0000 mg | ORAL_TABLET | Freq: Every day | ORAL | Status: DC

## 2020-05-06 MED ORDER — NITROGLYCERIN 0.4 MG SL SUBL: 0.4000 mg | SUBLINGUAL_TABLET | SUBLINGUAL | Status: DC | PRN

## 2020-05-06 NOTE — Plan of Care (Signed)

## 2020-05-06 NOTE — ED Provider Notes (Signed)
[facility] DEPT Provider Note   CSN: [id number] Arrival date & time: 05/15/2020  1039     History Chief Complaint  Patient presents with  . Cough    [name] [name] is a 86 y.o. male.  Several weeks of worsening shortness of breath and fatigue.  Was seen by primary care provider told he has allergies and started on a new inhaler.  Denies any significant chest pain but does have exertional dyspnea.  Recent loss of his wife has not feeling down or depressed, he has anhedonia.  Has poor oral intake for both oral nutrition and hydration.  Urinating normally normal bowel movements no abdominal pain.  No recent illness or fever        Past Medical History:  Diagnosis Date  . CAD, NATIVE VESSEL    a. stenting of the RCA in 06/2005 with a bare-metal stent with residual 50-70% narrowing in the left anterior descending coronary artery. b. nonischemic nuc 2007.  . Diverticulosis   . Hemorrhoids   . History of colon polyps   . HYPERLIPIDEMIA-MIXED   . HYPERTENSION, BENIGN   . Lumbar stenosis   . Moderate aortic stenosis   . PROSTATE CANCER, HX OF   . Skin cancer    face, neck  . Tubular adenoma     Patient Active Problem List   Diagnosis Date Noted  . Elevated troponin 05/15/2020  . Aortic stenosis, severe 05/17/2020  . Weakness 05/15/2020  . Fall 05/04/2020  . COPD (chronic obstructive pulmonary disease) ([place]) 05/10/2020  . Mild aortic stenosis 07/30/2014  . First degree atrioventricular block 08/09/2012  . BLOOD IN STOOL 10/16/2010  . PROSTATE CANCER, HX OF 10/16/2010  . Elevated lipids 05/19/2009  . HYPERTENSION, BENIGN 05/19/2009  . CAD, NATIVE VESSEL 05/19/2009    Past Surgical History:  Procedure Laterality Date  . APPENDECTOMY    . CARDIAC CATHETERIZATION    . COLONOSCOPY  02-2014  . CORONARY STENT PLACEMENT     CAD post stenting of the RCA in 06/2005 with a bare-metal stent with residual 50-70% narrowing inb the left anterior  descending coronary artery   . MOHS SURGERY     x 2  . PROSTATECTOMY         Family History  Problem Relation Age of Onset  . Heart disease Mother   . Hypertension Mother   . Colon cancer Neg Hx   . Esophageal cancer Neg Hx   . Stomach cancer Neg Hx   . Rectal cancer Neg Hx   . Heart attack Neg Hx   . Stroke Neg Hx     Social History   Tobacco Use  . Smoking status: Never Smoker  . Smokeless tobacco: Never Used  Vaping Use  . Vaping Use: Never used  Substance Use Topics  . Alcohol use: No  . Drug use: No    Home Medications Prior to Admission medications   Medication Sig Start Date End Date Taking? Authorizing Provider  albuterol (VENTOLIN HFA) 108 (90 Base) MCG/ACT inhaler Inhale 1-2 puffs into the lungs every 4 (four) hours as needed for wheezing. 05/04/20  Yes [provider]  amLODipine (NORVASC) 5 MG tablet Take 5 mg by mouth daily. 02/09/20  Yes [provider]  aspirin 81 MG tablet Take 81 mg by mouth daily.    Yes [provider]  benazepril (LOTENSIN) 20 MG tablet Take 1 tablet (20 mg total) by mouth daily. 08/15/12  Yes Josue Hector, MD  doxycycline (MONODOX) 100 MG capsule Take 100 mg by mouth 2 (two) times daily. 05/04/20  Yes [provider]  ezetimibe-simvastatin (VYTORIN) 10-20 MG tablet Take 1 tablet by mouth at bedtime.   Yes [provider]  fish oil-omega-3 fatty acids 1000 MG capsule Take 1 g by mouth daily.   Yes [provider]  hydrocortisone 2.5 % cream Apply 1 application topically 2 (two) times daily as needed (hemmorhoids).   Yes [provider]  nitroGLYCERIN (NITROSTAT) 0.4 MG SL tablet Place 1 tablet (0.4 mg total) under the tongue every 5 (five) minutes as needed. 09/06/15  Yes Isaiah Serge, NP  predniSONE (DELTASONE) 10 MG tablet Take 10 mg by mouth taper from 4 doses each day to 1 dose and stop. 05/04/20  Yes [provider]  sildenafil (VIAGRA) 100 MG tablet Take 100  mg by mouth daily as needed for erectile dysfunction.   Yes [provider]  SYMBICORT 160-4.5 MCG/ACT inhaler Inhale 2 puffs into the lungs in the morning and at bedtime. 05/04/20  Yes [provider]  triamcinolone cream (KENALOG) 0.1 % Apply 1 application topically 2 (two) times daily.   Yes [provider]    Allergies    Patient has no known allergies.  Review of Systems   Review of Systems  Constitutional: Positive for activity change and appetite change. Negative for chills and fever.  HENT: Negative for congestion and rhinorrhea.   Respiratory: Positive for cough (with sputum) and shortness of breath.   Cardiovascular: Positive for leg swelling. Negative for chest pain and palpitations.  Gastrointestinal: Negative for diarrhea, nausea and vomiting.  Genitourinary: Negative for difficulty urinating and dysuria.  Musculoskeletal: Negative for arthralgias and back pain.  Skin: Negative for color change and rash.  Neurological: Negative for light-headedness and headaches.  Psychiatric/Behavioral: Positive for dysphoric mood.    Physical Exam Updated Vital Signs BP 105/73 (BP Location: Left Arm)   Pulse 94   Temp 97.6 F (36.4 C) (Oral)   Resp 18   Ht 5\' 10"  (1.778 m)   Wt 77.3 kg   SpO2 98%   BMI 24.45 kg/m   Physical Exam Vitals and nursing note reviewed.  Constitutional:      General: He is not in acute distress.    Appearance: Normal appearance.  HENT:     Head: Normocephalic and atraumatic.     Nose: No rhinorrhea.  Eyes:     General:        Right eye: No discharge.        Left eye: No discharge.     Conjunctiva/sclera: Conjunctivae normal.  Cardiovascular:     Rate and Rhythm: Normal rate. Rhythm irregular.     Heart sounds: No murmur heard.   Pulmonary:     Effort: Pulmonary effort is normal. No respiratory distress.     Breath sounds: No stridor. No wheezing.  Abdominal:     General: Abdomen is flat. There is no distension.      Palpations: Abdomen is soft.  Musculoskeletal:        General: No deformity or signs of injury.     Right lower leg: Edema present.     Left lower leg: Edema present.     Comments: Symmetric bilateral 2+ pitting to the mid shin.  Pulses intact, no bony tenderness normal range of motion  Skin:    General: Skin is warm and dry.  Neurological:     General: No focal deficit present.  Mental Status: He is alert. Mental status is at baseline.     Motor: No weakness.  Psychiatric:        Behavior: Behavior normal.        Thought Content: Thought content normal.     ED Results / Procedures / Treatments   Labs (all labs ordered are listed, but only abnormal results are displayed) Labs Reviewed  CBC - Abnormal; Notable for the following components:      Result Value   RDW 15.9 (*)    All other components within normal limits  COMPREHENSIVE METABOLIC PANEL - Abnormal; Notable for the following components:   CO2 16 (*)    Glucose, Bld 120 (*)    BUN 84 (*)    Creatinine, Ser 2.07 (*)    Calcium 8.6 (*)    Total Protein 6.1 (*)    Albumin 3.2 (*)    AST 50 (*)    ALT 52 (*)    Alkaline Phosphatase 155 (*)    Total Bilirubin 2.1 (*)    GFR calc non Af Amer 28 (*)    GFR calc Af Amer 33 (*)    Anion gap 16 (*)    All other components within normal limits  BRAIN NATRIURETIC PEPTIDE - Abnormal; Notable for the following components:   B Natriuretic Peptide 3,817.0 (*)    All other components within normal limits  URINALYSIS, ROUTINE W REFLEX MICROSCOPIC - Abnormal; Notable for the following components:   Protein, ur 30 (*)    Bacteria, UA RARE (*)    All other components within normal limits  BASIC METABOLIC PANEL - Abnormal; Notable for the following components:   Sodium 134 (*)    CO2 15 (*)    BUN 80 (*)    Creatinine, Ser 2.06 (*)    Calcium 8.5 (*)    GFR calc non Af Amer 28 (*)    GFR calc Af Amer 33 (*)    Anion gap 16 (*)    All other components within normal  limits  CBC - Abnormal; Notable for the following components:   RDW 15.7 (*)    All other components within normal limits  TROPONIN I (HIGH SENSITIVITY) - Abnormal; Notable for the following components:   Troponin I (High Sensitivity) 171 (*)    All other components within normal limits  TROPONIN I (HIGH SENSITIVITY) - Abnormal; Notable for the following components:   Troponin I (High Sensitivity) 153 (*)    All other components within normal limits  TROPONIN I (HIGH SENSITIVITY) - Abnormal; Notable for the following components:   Troponin I (High Sensitivity) 147 (*)    All other components within normal limits  TROPONIN I (HIGH SENSITIVITY) - Abnormal; Notable for the following components:   Troponin I (High Sensitivity) 148 (*)    All other components within normal limits  SARS CORONAVIRUS 2 BY RT PCR [facility] ORDER, [place] LAB)  TSH    EKG EKG Interpretation  Date/Time:  Monday May 06 2020 11:16:54 EDT Ventricular Rate:  86 PR Interval:    QRS Duration: 123 QT Interval:  468 QTC Calculation: 560 R Axis:   -32 Text Interpretation: Sinus rhythm Prolonged PR interval Left bundle branch block Confirmed by Dewaine Conger [phone]) on 05/03/2020 1:04:22 PM   Radiology DG Chest 2 View  Result Date: 05/15/2020 CLINICAL DATA:  Shortness of breath, weakness and cough. EXAM: CHEST - 2 VIEW COMPARISON:  None. FINDINGS: The heart is borderline  enlarged. There is tortuosity and calcification of the thoracic aorta. No acute pulmonary findings. No infiltrates, edema or effusions. The bony thorax is intact. IMPRESSION: No acute cardiopulmonary findings. Electronically Signed   By: Marijo Sanes M.D.   On: 05/11/2020 12:36    Procedures Procedures (including critical care time)  Medications Ordered in ED Medications  nitroGLYCERIN (NITROSTAT) SL tablet 0.4 mg (has no administration in time range)  mometasone-formoterol (DULERA) 200-5 MCG/ACT inhaler 2 puff (2  puffs Inhalation Given 05/05/2020 2100)  ipratropium-albuterol (DUONEB) 0.5-2.5 (3) MG/3ML nebulizer solution 3 mL (has no administration in time range)  enoxaparin (LOVENOX) injection 30 mg (30 mg Subcutaneous Given 05/05/2020 1818)  sodium chloride flush (NS) 0.9 % injection 3 mL (3 mLs Intravenous Given 05/01/2020 2122)  sodium chloride flush (NS) 0.9 % injection 3 mL (has no administration in time range)  0.9 %  sodium chloride infusion (has no administration in time range)  ondansetron (ZOFRAN) tablet 4 mg (has no administration in time range)    Or  ondansetron (ZOFRAN) injection 4 mg (has no administration in time range)  acetaminophen (TYLENOL) tablet 650 mg (650 mg Oral Given 05/07/20 0013)    Or  acetaminophen (TYLENOL) suppository 650 mg ( Rectal See Alternative 05/07/20 0013)  aspirin EC tablet 81 mg (has no administration in time range)  omega-3 acid ethyl esters (LOVAZA) capsule 1 g (has no administration in time range)  aspirin chewable tablet 324 mg (324 mg Oral Given by Other 05/04/2020 1355)  furosemide (LASIX) injection 40 mg (40 mg Intravenous Given 05/02/2020 1352)  hydrocortisone (ANUSOL-HC) suppository 25 mg (25 mg Rectal Given 05/23/2020 1554)    ED Course  I have reviewed the triage vital signs and the nursing notes.  Pertinent labs & imaging results that were available during my care of the patient were reviewed by me and considered in my medical decision making (see chart for details).    MDM Rules/Calculators/A&P                          Worsening symptoms of fatigue, shortness of breath, poor p.o. intake.  Also noted to have leg swelling on exam, secondary to the death of his wife a few months ago.  Poor oral intake, family is concerned for failure to thrive.  He has leg swelling exertional dyspnea, will evaluate his heart, will evaluate for thyroid dysfunction will evaluate for electrolyte derangement or renal/liver dysfunction.  Will also check for signs of infection to include  Covid test, urinalysis chest x-ray.  No fever, does have reported cough with sputum.  Patient did show signs of elevated creatinine, no significant electrolyte derangements otherwise with a mild metabolic acidosis, he was Covid negative, thyroid function studies were fine, CBC unremarkable. BNP was markedly elevated, troponin was elevated as well, aspirin given. ECG reviewed by myself shows a widened QRS complex, no acute ischemic change interval abnormality or arrhythmia compared to old it was fairly unchanged. I feel at this point he has clinical signs and laboratory signs consistent with heart failure, old echo cardiogram did show aortic stenosis, this could be the cause. He will get admitted to the hospital for further work-up, I consulted the admitting team and they agree to admit.  Final Clinical Impression(s) / ED Diagnoses Final diagnoses:  Leg swelling  Elevated brain natriuretic peptide (BNP) level  Elevated troponin  Fatigue, unspecified type  DOE (dyspnea on exertion)    Rx / DC Orders ED Discharge Orders  None       Breck Coons, MD 05/07/20 [phone]

## 2020-05-06 NOTE — ED Notes (Addendum)
Date and time results received: 04/30/2020 11:56 AM  (use smartphrase ".now" to insert current time)  Test: Trop Critical Value: 171  Name of Provider Notified: Ron Parker  Orders Received? Or Actions Taken?: Orders Received - See Orders for details

## 2020-05-06 NOTE — ED Notes (Signed)
Patient aware we need a urine specimen, doesn't feel like he can urinate at this time.  Just received IV lasix and male purewick is in place.

## 2020-05-06 NOTE — ED Triage Notes (Addendum)
Patient arrived via [facility] with weakness and failure to thrive which has been ongoing since his wife died.  Patient from home, lives alone.  EMS placed 20g left hand and patient received 214ml NS bolus in route.  EMS vitals were 98/72, 81-HR, 28Res, 96% on room air, 97.3 temp and 138 CBG.  Patient was afib on the monitor for EMS, unknown if he has a hx of afib.  Was on a blood thinner but was recently stop by his primary care MD.

## 2020-05-06 NOTE — ED Notes (Signed)
ED Provider at bedside. 

## 2020-05-06 NOTE — H&P (Addendum)
History and Physical    [name] [name] IRW:[id number] DOB: [birth year] DOA: 05/10/2020  PCP: Burnard Bunting, MD   Patient coming from: Home  I have personally briefly reviewed patient's old medical records in [place]  Chief Complaint: Weakness  HPI: [name] [name] is a 86 y.o. male with medical history significant for severe aortic stenosis, coronary artery disease, diverticulosis, lumbar spinal stenosis who presents to the emergency room via EMS for evaluation of weakness and failure to thrive for about 2 weeks following the death of his wife.  He has had poor oral intake and generalized weakness as well as shortness of breath at rest.  He also has lower extremity swelling.  He has had falls at home.  Patient states that he has had difficulty getting around the house and usually uses an assist device but is unable to find it at this time.  Patient lives alone and when EMS arrived he was noted to have blood pressure 98/72, heart rate of 81, respiratory of 28 and pulse oximetry of 96% on room air.  He received 250 cc fluid bolus in route to the hospital.  Labs show sodium 138, potassium 4.8, chloride 106, bicarb 16, BUN 84, creatinine 2.07, AST 50, ALT 52, BNP 3817, troponin I 71, white count 9.4, hemoglobin 14.6, hematocrit 44.6, MCV 90.1, RDW 15.9, platelet count 229, TSH 3.1 Chest x-ray reviewed by me shows no acute cardiopulmonary findings. Twelve-lead EKG reviewed by me shows sinus rhythm with a left bundle branch block   ED Course: Patient is an 86 year old male who presents to the emergency room for evaluation of generalized weakness and failure to thrive following the death of his wife. Patient has had poor oral intake.  He is noted to have elevated BNP level, elevated troponin levels as well as mild hyperkalemia.  Serum creatinine is 2.42 with no baseline to compare with.  Patient will be admitted to the hospital for further evaluation.   Review of Systems: As per HPI  otherwise 10 point review of systems negative.    Past Medical History:  Diagnosis Date  . CAD, NATIVE VESSEL    a. stenting of the RCA in 06/2005 with a bare-metal stent with residual 50-70% narrowing in the left anterior descending coronary artery. b. nonischemic nuc 2007.  . Diverticulosis   . Hemorrhoids   . History of colon polyps   . HYPERLIPIDEMIA-MIXED   . HYPERTENSION, BENIGN   . Lumbar stenosis   . Moderate aortic stenosis   . PROSTATE CANCER, HX OF   . Skin cancer    face, neck  . Tubular adenoma     Past Surgical History:  Procedure Laterality Date  . APPENDECTOMY    . CARDIAC CATHETERIZATION    . COLONOSCOPY  02-2014  . CORONARY STENT PLACEMENT     CAD post stenting of the RCA in 06/2005 with a bare-metal stent with residual 50-70% narrowing inb the left anterior descending coronary artery   . MOHS SURGERY     x 2  . PROSTATECTOMY       reports that he has never smoked. He has never used smokeless tobacco. He reports that he does not drink alcohol and does not use drugs.  No Known Allergies  Family History  Problem Relation Age of Onset  . Heart disease Mother   . Hypertension Mother   . Colon cancer Neg Hx   . Esophageal cancer Neg Hx   . Stomach cancer Neg Hx   .  Rectal cancer Neg Hx   . Heart attack Neg Hx   . Stroke Neg Hx      Prior to Admission medications   Medication Sig Start Date End Date Taking? Authorizing Provider  albuterol (VENTOLIN HFA) 108 (90 Base) MCG/ACT inhaler Inhale 1-2 puffs into the lungs every 4 (four) hours as needed for wheezing. 05/04/20   [provider]  amLODipine (NORVASC) 5 MG tablet Take 5 mg by mouth daily. 02/09/20   [provider]  aspirin 81 MG tablet Take 81 mg by mouth daily.     [provider]  benazepril (LOTENSIN) 20 MG tablet Take 1 tablet (20 mg total) by mouth daily. 08/15/12   Josue Hector, MD  doxycycline (MONODOX) 100 MG capsule Take 100 mg by mouth 2 (two) times daily.  05/04/20   [provider]  fish oil-omega-3 fatty acids 1000 MG capsule Take 1 g by mouth daily.    [provider]  nitroGLYCERIN (NITROSTAT) 0.4 MG SL tablet Place 1 tablet (0.4 mg total) under the tongue every 5 (five) minutes as needed. 09/06/15   Isaiah Serge, NP  predniSONE (DELTASONE) 10 MG tablet Take 10 mg by mouth taper from 4 doses each day to 1 dose and stop. 05/04/20   [provider]  SYMBICORT 160-4.5 MCG/ACT inhaler Inhale 2 puffs into the lungs in the morning and at bedtime. 05/04/20   [provider]  triamterene-hydrochlorothiazide (MAXZIDE) 75-50 MG per tablet Take 1 tablet by mouth daily.    [provider]    Physical Exam: Vitals:   05/03/2020 1213 05/13/2020 1230 04/26/2020 1300 05/14/2020 1354  BP: 107/84 116/88 108/77 120/85  Pulse: 78 86 86 88  Resp: (!) 24 17 (!) 21 (!) 30  Temp:      TempSrc:      SpO2: 100% 100% 99% 99%  Weight:      Height:         Vitals:   04/25/2020 1213 05/12/2020 1230 05/20/2020 1300 05/18/2020 1354  BP: 107/84 116/88 108/77 120/85  Pulse: 78 86 86 88  Resp: (!) 24 17 (!) 21 (!) 30  Temp:      TempSrc:      SpO2: 100% 100% 99% 99%  Weight:      Height:        Constitutional: NAD, alert and oriented x 3.  Chronically ill-appearing Eyes: PERRL, lids and conjunctivae pallor ENMT: Mucous membranes are moist.  Neck: normal, supple, no masses, no thyromegaly Respiratory: Bilateral air entry, no wheezing, no crackles. Normal respiratory effort. No accessory muscle use.  Cardiovascular: Regular rate and rhythm, positive systolic murmur murmurs / rubs / gallops. 2+ extremity edema. 2+ pedal pulses. No carotid bruits.  Abdomen: no tenderness, no masses palpated. No hepatosplenomegaly. Bowel sounds positive.  Musculoskeletal: no clubbing / cyanosis. No joint deformity upper and lower extremities.  Skin: no rashes, lesions, ulcers.  Neurologic: Weakness Psychiatric: Flat affect.  Labile mood   Labs on  Admission: I have personally reviewed following labs and imaging studies  CBC: Recent Labs  Lab 05/05/20 1948 05/20/2020 1101  WBC 8.4 9.4  HGB 16.1 14.6  HCT 51.0 44.6  MCV 90.4 90.1  PLT 262 585   Basic Metabolic Panel: Recent Labs  Lab 05/05/20 1948 04/26/2020 1101  NA 137 138  K 5.4* 4.8  CL 104 106  CO2 18* 16*  GLUCOSE 116* 120*  BUN 77* 84*  CREATININE 2.42* 2.07*  CALCIUM 9.3 8.6*  GFR: Estimated Creatinine Clearance: 26.4 mL/min (A) (by C-G formula based on SCr of 2.07 mg/dL (H)). Liver Function Tests: Recent Labs  Lab 05/13/2020 1101  AST 50*  ALT 52*  ALKPHOS 155*  BILITOT 2.1*  PROT 6.1*  ALBUMIN 3.2*   No results for input(s): LIPASE, AMYLASE in the last 168 hours. No results for input(s): AMMONIA in the last 168 hours. Coagulation Profile: No results for input(s): INR, PROTIME in the last 168 hours. Cardiac Enzymes: No results for input(s): CKTOTAL, CKMB, CKMBINDEX, TROPONINI in the last 168 hours. BNP (last 3 results) No results for input(s): PROBNP in the last 8760 hours. HbA1C: No results for input(s): HGBA1C in the last 72 hours. CBG: No results for input(s): GLUCAP in the last 168 hours. Lipid Profile: No results for input(s): CHOL, HDL, LDLCALC, TRIG, CHOLHDL, LDLDIRECT in the last 72 hours. Thyroid Function Tests: Recent Labs    05/23/2020 1102  TSH 3.114   Anemia Panel: No results for input(s): VITAMINB12, FOLATE, FERRITIN, TIBC, IRON, RETICCTPCT in the last 72 hours. Urine analysis: No results found for: COLORURINE, APPEARANCEUR, LABSPEC, [place], GLUCOSEU, HGBUR, BILIRUBINUR, KETONESUR, PROTEINUR, UROBILINOGEN, NITRITE, LEUKOCYTESUR  Radiological Exams on Admission: DG Chest 2 View  Result Date: 04/24/2020 CLINICAL DATA:  Shortness of breath, weakness and cough. EXAM: CHEST - 2 VIEW COMPARISON:  None. FINDINGS: The heart is borderline enlarged. There is tortuosity and calcification of the thoracic aorta. No acute pulmonary findings.  No infiltrates, edema or effusions. The bony thorax is intact. IMPRESSION: No acute cardiopulmonary findings. Electronically Signed   By: Marijo Sanes M.D.   On: 05/18/2020 12:36    EKG: Independently reviewed.  Sinus rhythm Left bundle branch block  Assessment/Plan Principal Problem:   Elevated troponin Active Problems:   HYPERTENSION, BENIGN   CAD, NATIVE VESSEL   Aortic stenosis, severe   Weakness   Fall     Elevated troponin Patient has a history of coronary artery disease and is status post stent angioplasty He presents to the emergency room for evaluation of weakness and shortness of breath and is noted to have elevated troponin levels of unclear significance Will obtain serial troponin levels Place patient on aspirin We will request cardiology consult   Severe aortic stenosis Patient with a known history of severe AS, patient had a 2D Echo which was done in 2019 and which showed an aortic valve mean gradient of 11mmHg He had a normal LVEF of 50 to 55% Shortness of breath may be secondary to severe aortic stenosis We will repeat 2D echocardiogram to assess LVEF and to measure aortic valve gradient    Weakness and dehydration Most likely secondary to poor oral intake No baseline labs to compare with but patient noted to have a serum creatinine of 2.42 on admission Encourage oral fluid intake Hesitant to administer IV fluids due to patient having an increased risk of developing congestive heart failure Hold Maxide for now   Status post fall Place patient on fall precautions   COPD Continue as needed bronchodilator therapy as well as inhaled steroids   Depression Patient with a flat affect and very labile mood Most likely related to recent loss, his wife just passed We will request behavioral health consult   DVT prophylaxis: Lovenox Code Status: DNR Family Communication: Greater than 50% of time was spent discussing patient's condition and plan of care  with him and his sister at the bedside.  They verbalized understanding and agreed with the plan.  CODE STATUS was discussed and  patient is a DO NOT RESUSCITATE Disposition Plan: Back to previous home environment Consults called: Cardiology, PT, behavioral health    Derryck Shahan MD [facility]     05/20/2020, 2:14 PM

## 2020-05-06 NOTE — ED Notes (Addendum)
Patient has been complaining of hemorroid pain/itching since arrival. MD aware.  When visualized however, patient actually has a small stage 2 pressure ulcer to sacrum.  Barrier cream applied.

## 2020-05-06 NOTE — ED Notes (Signed)
[name] [name], EDP made aware patient to c/o hemorrhoid pain/itching.

## 2020-05-07 ENCOUNTER — Inpatient Hospital Stay (HOSPITAL_COMMUNITY): Payer: Medicare Other

## 2020-05-07 DIAGNOSIS — N179 Acute kidney failure, unspecified: Secondary | ICD-10-CM

## 2020-05-07 DIAGNOSIS — I350 Nonrheumatic aortic (valve) stenosis: Secondary | ICD-10-CM

## 2020-05-07 DIAGNOSIS — R778 Other specified abnormalities of plasma proteins: Secondary | ICD-10-CM

## 2020-05-07 DIAGNOSIS — I2510 Atherosclerotic heart disease of native coronary artery without angina pectoris: Secondary | ICD-10-CM

## 2020-05-07 DIAGNOSIS — I351 Nonrheumatic aortic (valve) insufficiency: Secondary | ICD-10-CM

## 2020-05-07 DIAGNOSIS — I447 Left bundle-branch block, unspecified: Secondary | ICD-10-CM

## 2020-05-07 DIAGNOSIS — I441 Atrioventricular block, second degree: Secondary | ICD-10-CM

## 2020-05-07 DIAGNOSIS — I361 Nonrheumatic tricuspid (valve) insufficiency: Secondary | ICD-10-CM

## 2020-05-07 DIAGNOSIS — I340 Nonrheumatic mitral (valve) insufficiency: Secondary | ICD-10-CM

## 2020-05-07 DIAGNOSIS — L8990 Pressure ulcer of unspecified site, unspecified stage: Secondary | ICD-10-CM | POA: Insufficient documentation

## 2020-05-07 DIAGNOSIS — I5041 Acute combined systolic (congestive) and diastolic (congestive) heart failure: Secondary | ICD-10-CM

## 2020-05-07 LAB — ECHOCARDIOGRAM COMPLETE
AR max vel: 0.44 cm2
AV Area VTI: 0.44 cm2
AV Area mean vel: 0.41 cm2
AV Mean grad: 13.5 mmHg
AV Peak grad: 25.6 mmHg
Ao pk vel: 2.53 m/s
Height: 70 in
S' Lateral: 4 cm
Weight: 2726.65 oz

## 2020-05-07 LAB — CBC
HCT: 49.9 % (ref 39.0–52.0)
Hemoglobin: 15.9 g/dL (ref 13.0–17.0)
MCH: 28.9 pg (ref 26.0–34.0)
MCHC: 31.9 g/dL (ref 30.0–36.0)
MCV: 90.7 fL (ref 80.0–100.0)
Platelets: 245 10*3/uL (ref 150–400)
RBC: 5.5 MIL/uL (ref 4.22–5.81)
RDW: 15.7 % — ABNORMAL HIGH (ref 11.5–15.5)
WBC: 8.7 10*3/uL (ref 4.0–10.5)
nRBC: 0 % (ref 0.0–0.2)

## 2020-05-07 LAB — BASIC METABOLIC PANEL
Anion gap: 16 — ABNORMAL HIGH (ref 5–15)
BUN: 80 mg/dL — ABNORMAL HIGH (ref 8–23)
CO2: 15 mmol/L — ABNORMAL LOW (ref 22–32)
Calcium: 8.5 mg/dL — ABNORMAL LOW (ref 8.9–10.3)
Chloride: 103 mmol/L (ref 98–111)
Creatinine, Ser: 2.06 mg/dL — ABNORMAL HIGH (ref 0.61–1.24)
GFR calc Af Amer: 33 mL/min — ABNORMAL LOW (ref >60)
GFR calc non Af Amer: 28 mL/min — ABNORMAL LOW (ref >60)
Glucose, Bld: 87 mg/dL (ref 70–99)
Potassium: 4.2 mmol/L (ref 3.5–5.1)
Sodium: 134 mmol/L — ABNORMAL LOW (ref 135–145)

## 2020-05-07 MED ORDER — PERFLUTREN LIPID MICROSPHERE
1.0000 mL | INTRAVENOUS | Status: AC | PRN
  Administered 2020-05-07: 2 mL via INTRAVENOUS
  Filled 2020-05-07: qty 10

## 2020-05-07 MED ORDER — HYDROXYZINE HCL 25 MG PO TABS
25.0000 mg | ORAL_TABLET | Freq: Every evening | ORAL | Status: DC | PRN
  Administered 2020-05-08 – 2020-05-14 (×6): 25 mg via ORAL
  Filled 2020-05-07 (×6): qty 1

## 2020-05-07 MED ORDER — FUROSEMIDE 10 MG/ML IJ SOLN
40.0000 mg | Freq: Every day | INTRAMUSCULAR | Status: DC
  Administered 2020-05-07 – 2020-05-08 (×2): 40 mg via INTRAVENOUS
  Filled 2020-05-07 (×3): qty 4

## 2020-05-07 NOTE — Evaluation (Signed)
Physical Therapy Evaluation Patient Details Name: [name] [name] MRN: [id number] DOB: [birth year] Today's Date: 05/07/2020   History of Present Illness  86 yo male admitted with elevated troponing, weakness, FTT. Hx of aortic stenosis, CAD, spinal stenosis. Per chart, pt has had difficulty managing at home since wife passed away recently.  Clinical Impression  On eval, pt required Min assist for mobility. He walked ~10 feet with use of a RW. Pt presents with general weakness, decreased activity tolerance, and impaired gait and balance. Pt mentioned his wife's passing a couple of times during session. At this time, recommendation is for SNF rehab. Will follow and progress activity as tolerated.     Follow Up Recommendations SNF    Equipment Recommendations  None recommended by PT    Recommendations for Other Services       Precautions / Restrictions Precautions Precautions: Fall Restrictions Weight Bearing Restrictions: No      Mobility  Bed Mobility Overal bed mobility: Needs Assistance Bed Mobility: Supine to Sit     Supine to sit: Min guard;HOB elevated     General bed mobility comments: Cues for safety. Increased time.  Transfers Overall transfer level: Needs assistance Equipment used: Rolling walker (2 wheeled) Transfers: Sit to/from Stand Sit to Stand: From elevated surface;Min assist         General transfer comment: Assist to steady. Cues for safety, hand placement.  Ambulation/Gait Ambulation/Gait assistance: Min assist Gait Distance (Feet): 10 Feet Assistive device: Rolling walker (2 wheeled) Gait Pattern/deviations: Step-through pattern;Decreased stride length     General Gait Details: Assist to steady. Distance limited by weakness. Cues for safety.  Stairs            Wheelchair Mobility    Modified Rankin (Stroke Patients Only)       Balance Overall balance assessment: Needs assistance         Standing balance support: Bilateral  upper extremity supported Standing balance-Leahy Scale: Poor                               Pertinent Vitals/Pain Pain Assessment: Faces Faces Pain Scale: Hurts little more Pain Location: buttocks Pain Descriptors / Indicators: Discomfort;Sore Pain Intervention(s): Monitored during session;Repositioned    Home Living Family/patient expects to be discharged to:: Unsure Living Arrangements: Alone   Type of Home: House Home Access: Stairs to enter   [employer] of Steps: 4 Home Layout: One level Home Equipment: Walker - 2 wheels;Bedside commode      Prior Function Level of Independence: Independent               Hand Dominance        Extremity/Trunk Assessment   Upper Extremity Assessment Upper Extremity Assessment: Generalized weakness    Lower Extremity Assessment Lower Extremity Assessment: Generalized weakness    Cervical / Trunk Assessment Cervical / Trunk Assessment: Normal  Communication   Communication: No difficulties  Cognition Arousal/Alertness: Awake/alert Behavior During Therapy: Flat affect Overall Cognitive Status: Within Functional Limits for tasks assessed                                        General Comments      Exercises     Assessment/Plan    PT Assessment Patient needs continued PT services  PT Problem List Decreased strength;Decreased mobility;Decreased balance;Decreased activity tolerance;Decreased safety  awareness;Decreased knowledge of use of DME       PT Treatment Interventions DME instruction;Gait training;Therapeutic activities;Therapeutic exercise;Patient/family education;Balance training;Functional mobility training    PT Goals (Current goals can be found in the Care Plan section)  Acute Rehab PT Goals Patient Stated Goal: to regain strength PT Goal Formulation: With patient Time For Goal Achievement: 05/21/20 Potential to Achieve Goals: Good    Frequency Min 3X/week    Barriers to discharge        Co-evaluation               AM-PAC PT "6 Clicks" Mobility  Outcome Measure Help needed turning from your back to your side while in a flat bed without using bedrails?: A Little Help needed moving from lying on your back to sitting on the side of a flat bed without using bedrails?: A Little Help needed moving to and from a bed to a chair (including a wheelchair)?: A Little Help needed standing up from a chair using your arms (e.g., wheelchair or bedside chair)?: A Little Help needed to walk in hospital room?: A Little Help needed climbing 3-5 steps with a railing? : A Little 6 Click Score: 18    End of Session Equipment Utilized During Treatment: Gait belt Activity Tolerance: Patient limited by fatigue Patient left: in chair;with call bell/phone within reach;with chair alarm set   PT Visit Diagnosis: Muscle weakness (generalized) (M62.81);Unsteadiness on feet (R26.81)    Time: 4580-9983 PT Time Calculation (min) (ACUTE ONLY): 11 min   Charges:   PT Evaluation $PT Eval Low Complexity: [place], PT Acute Rehabilitation  Office: [phone] Pager: [phone]

## 2020-05-07 NOTE — Progress Notes (Signed)
  Echocardiogram 2D Echocardiogram with definity has been performed.  [name] [name] M 05/07/2020, 2:18 PM

## 2020-05-07 NOTE — Progress Notes (Signed)
Pt alert and aware in bed. Pt tell me his recent history and what he has been through. His main complaint is that he cannot sleep. So we talked about how vital that was for his well-being. The chaplain offered caring and supportive presence and listening ear. He offered sacred words and music to bring comfort and God's healing presence. Pt was grateful for visit. Further visits will be offered.

## 2020-05-07 NOTE — Consult Note (Addendum)
Cardiology Consultation:   Patient ID: [name] [name]; [id number]; 06-24-1932   Admit date: 05/22/2020 Date of Consult: 05/07/2020  Primary Care Provider: Burnard Bunting, MD Primary Cardiologist: Dr. Jenkins Rouge, MD   Patient Profile:   [name] [name] is a 86 y.o. male with a hx of CAD (BMS to?RCA 2006 with residual 50-70% LAD), moderate aortic stenosis, HTN, HLD, lumbar stenosis, diverticulosis, hemorrhoids, tubular adenoma, possible CKD stage II who is being seen today for the evaluation of CHF at the request of Dr. Verlon Au.  History of Present Illness:   [name] [name] is an 86yo M with a hx as stated above who presented to [facility] with SOB, increased dyspnea on exertion and poor oral intake for the last several weeks. He reports that his wife passed away several months ago and since this time he has had progressive decline in his functional health. He denies anginal symptoms however states that he has had worsening LE edema and orthopnea symptoms. He lives alone at an independent care facility and is very active at baseline. He states that due to his symptoms, he called EMS for further evaluation. On EMS arrival BP was found to be soft at 98/72. He received a 250 NS bolus. Creatinine was 2.07, AST/ALT was 50/52. BNP found to be markedly elevated at 3817. hsT was 171>>153>>147>>148. CXR with no acute cardiopulmonary findings. EKG with NSR and LBBB which appears to be present on prior tracings.   He has a hx of CAD for which he underwent a cardiac catheterization 06/2005. It appears that he undwerwent a diagnostic cath in the OP setting and due to the degree of stenosis, he then was sent to [facility] cath lab and underwent a successful percutaneous coronary intervention of the lesion in the proximal LAD using a bare metal stent with improvement in narrowing from 95% to 0%. However, the discharge summary from that time indicates he had a stent to the RCA, not the LAD. It is unclear if this was a charting  error. Last nuclear study in 2007 with diaphraghmatic attenuation but no ischemia. Echocardiogram from 08/2016 showed EF 55-60%, mild LVH, grade 1 DD, moderate AS, mild AI/MR/TR, mildly elevated PA pressure.   Past Medical History:  Diagnosis Date  . CAD, NATIVE VESSEL    a. stenting of the RCA in 06/2005 with a bare-metal stent with residual 50-70% narrowing in the left anterior descending coronary artery. b. nonischemic nuc 2007.  . Diverticulosis   . Hemorrhoids   . History of colon polyps   . HYPERLIPIDEMIA-MIXED   . HYPERTENSION, BENIGN   . Lumbar stenosis   . Moderate aortic stenosis   . PROSTATE CANCER, HX OF   . Skin cancer    face, neck  . Tubular adenoma     Past Surgical History:  Procedure Laterality Date  . APPENDECTOMY    . CARDIAC CATHETERIZATION    . COLONOSCOPY  02-2014  . CORONARY STENT PLACEMENT     CAD post stenting of the RCA in 06/2005 with a bare-metal stent with residual 50-70% narrowing inb the left anterior descending coronary artery   . MOHS SURGERY     x 2  . PROSTATECTOMY       Prior to Admission medications   Medication Sig Start Date End Date Taking? Authorizing Provider  albuterol (VENTOLIN HFA) 108 (90 Base) MCG/ACT inhaler Inhale 1-2 puffs into the lungs every 4 (four) hours as needed for wheezing. 05/04/20  Yes [provider]  amLODipine ([place])  5 MG tablet Take 5 mg by mouth daily. 02/09/20  Yes [provider]  aspirin 81 MG tablet Take 81 mg by mouth daily.    Yes [provider]  benazepril (LOTENSIN) 20 MG tablet Take 1 tablet (20 mg total) by mouth daily. 08/15/12  Yes Josue Hector, MD  doxycycline (MONODOX) 100 MG capsule Take 100 mg by mouth 2 (two) times daily. 05/04/20  Yes [provider]  ezetimibe-simvastatin (VYTORIN) 10-20 MG tablet Take 1 tablet by mouth at bedtime.   Yes [provider]  fish oil-omega-3 fatty acids 1000 MG capsule Take 1 g by mouth daily.   Yes [provider]  hydrocortisone 2.5 % cream Apply 1 application topically 2 (two) times daily as needed (hemmorhoids).   Yes [provider]  nitroGLYCERIN (NITROSTAT) 0.4 MG SL tablet Place 1 tablet (0.4 mg total) under the tongue every 5 (five) minutes as needed. 09/06/15  Yes Isaiah Serge, NP  predniSONE (DELTASONE) 10 MG tablet Take 10 mg by mouth taper from 4 doses each day to 1 dose and stop. 05/04/20  Yes [provider]  sildenafil (VIAGRA) 100 MG tablet Take 100 mg by mouth daily as needed for erectile dysfunction.   Yes [provider]  SYMBICORT 160-4.5 MCG/ACT inhaler Inhale 2 puffs into the lungs in the morning and at bedtime. 05/04/20  Yes [provider]  triamcinolone cream (KENALOG) 0.1 % Apply 1 application topically 2 (two) times daily.   Yes [provider]    Inpatient Medications: Scheduled Meds: . aspirin EC  81 mg Oral Daily  . enoxaparin (LOVENOX) injection  30 mg Subcutaneous Q24H  . mometasone-formoterol  2 puff Inhalation BID  . omega-3 acid ethyl esters  1 g Oral Daily  . sodium chloride flush  3 mL Intravenous Q12H   Continuous Infusions: . sodium chloride     PRN Meds: sodium chloride, acetaminophen **OR** acetaminophen, ipratropium-albuterol, nitroGLYCERIN, ondansetron **OR** ondansetron (ZOFRAN) IV, perflutren lipid microspheres (DEFINITY) IV suspension, sodium chloride flush  Allergies:   No Known Allergies  Social History:   Social History   Socioeconomic History  . Marital status: Widowed    Spouse name: Not on file  . Number of children: 2  . Years of education: Not on file  . Highest education level: Not on file  Occupational History  . Occupation: retired Secondary school teacher  Tobacco Use  . Smoking status: Never Smoker  . Smokeless tobacco: Never Used  Vaping Use  . Vaping Use: Never used  Substance and Sexual Activity  . Alcohol use: No  . Drug use: No  . Sexual activity: Not on file  Other  Topics Concern  . Not on file  Social History Narrative  . Not on file   Social Determinants of Health   Financial Resource Strain:   . Difficulty of Paying Living Expenses: Not on file  Food Insecurity:   . Worried About Charity fundraiser in the Last Year: Not on file  . Ran Out of Food in the Last Year: Not on file  Transportation Needs:   . Lack of Transportation (Medical): Not on file  . Lack of Transportation (Non-Medical): Not on file  Physical Activity:   . Days of Exercise per Week: Not on file  . Minutes of Exercise per Session: Not on file  Stress:   . Feeling of Stress : Not on file  Social Connections:   . Frequency of Communication with Friends and  Family: Not on file  . Frequency of Social Gatherings with Friends and Family: Not on file  . Attends Religious Services: Not on file  . Active Member of Clubs or Organizations: Not on file  . Attends Archivist Meetings: Not on file  . Marital Status: Not on file  Intimate Partner Violence:   . Fear of Current or Ex-Partner: Not on file  . Emotionally Abused: Not on file  . Physically Abused: Not on file  . Sexually Abused: Not on file    Family History:   Family History  Problem Relation Age of Onset  . Heart disease Mother   . Hypertension Mother   . Colon cancer Neg Hx   . Esophageal cancer Neg Hx   . Stomach cancer Neg Hx   . Rectal cancer Neg Hx   . Heart attack Neg Hx   . Stroke Neg Hx    Family Status:  Family Status  Relation Name Status  . Mother  Deceased  . Father  Deceased  . MGM  Deceased  . MGF  Deceased  . PGM  Deceased  . PGF  Deceased  . Neg Hx  (Not Specified)   ROS:  Please see the history of present illness.  All other ROS reviewed and negative.     Physical Exam/Data:   Vitals:   05/07/20 0519 05/07/20 0853 05/07/20 0925 05/07/20 1223  BP: 105/73 113/77  102/79  Pulse: 94 (!) 43  99  Resp: 18 18  18   Temp: 97.6 F (36.4 C) 97.7 F (36.5 C)  (!) 97.5 F (36.4  C)  TempSrc: Oral Oral  Oral  SpO2: 98% 98% 99% 98%  Weight:      Height:        Intake/Output Summary (Last 24 hours) at 05/07/2020 1426 Last data filed at 05/07/2020 1400 Gross per 24 hour  Intake 240 ml  Output 700 ml  Net -460 ml   Filed Weights   04/29/2020 1114 05/04/2020 2049  Weight: 79.4 kg 77.3 kg   Body mass index is 24.45 kg/m.   General: Ill appearing, NAD Neck: Negative for carotid bruits. No JVD Lungs:Clear to ausculation bilaterally. No wheezes, rales, or rhonchi. Breathing is unlabored. Cardiovascular: RRR with S1 S2. + murmurs, rubs, gallops, or LV heave appreciated. Abdomen: Soft, non-tender, non-distended. No obvious abdominal masses. Extremities: 2+ edema. Radial pulses 2+ bilaterally Neuro: Alert and oriented. No focal deficits. No facial asymmetry. MAE spontaneously. Psych: Responds to questions appropriately with normal affect.    EKG:  The EKG was personally reviewed and demonstrates: 04/27/2020 NSR with LBBB, HR 86bpm Telemetry:  Telemetry was personally reviewed and demonstrates: 05/07/20 NSR with Mobitz Type II, HR 70's   Relevant CV Studies:  Cardiac catheterization 01/05/2011:   RESULTS:  Initially the stenosis in the proximal LAD was estimated at 95%,  and there was a tandem lesion which was about 50%.  The distal flow was TIMI-  2 flow.  Following stenting, the two stenoses improved to 0%, and the flow  improved to TIMI-3 flow.  There was residual 70% stenosis in the mid portion  of the posterior descending branch.   CONCLUSION:  Successful percutaneous coronary intervention of the lesion in  the proximal left anterior descending using a bare metal stent with  improvement in narrowing from 95% to 0% and improvement in flow from TIMI-2  to TIMI-3 flow.  Cardiac catheterization 01/05/2011:  THE LEFT MAIN CORONARY ARTERY was free of significant disease.  THE LEFT ANTERIOR DESCENDING ARTERY had heavy calcification proximally.  The  LAD gave  rise to 2 sepal perforators, a small diagonal branch, and then a  moderate diagonal branch.  There was a moderately long area of segmental  disease in the proximal LAD of 50 to 70% with calcification.  There was also  50% ostial stenosis in the first small diagonal branch.   THE CIRCUMFLEX ARTERY gave rise to a small marginal branch, a large marginal  branch, and a posterolateral branch.  These vessels were irregular, and  there was no significant obstruction.   THE RIGHT CORONARY ARTERY gave rise to a conus, had a 95% proximal stenosis  and a 50% proximal stenosis with TIMI-2 flow distally.  The right coronary  artery gave rise to a right ventricular branch, a posterior descending  branch, and 4 posterolateral branches.  The distal right coronary artery  also filled faintly by collaterals from the left coronary artery.   THE LEFT VENTRICULOGRAM performed in the RAO projection showed hypokinesis  of the anterolateral wall and apex.  The inferior wall moved well.  The  estimated ejection fraction was 45%.   CONCLUSION:  Coronary artery disease with 50 to 70% stenosis in the proximal  left anterior descending, no significant obstruction in the circumflex  artery, 95% proximal stenosis in the right coronary artery, and  anterolateral wall and apical wall hypokinesis with estimated ejection  fraction of 45%.   RECOMMENDATIONS:  The patient has a tight lesion in the right coronary  artery, and his recent scan showed inferior ischemia.  I think we should  proceed with intervention on the right coronary artery today.  Surprisingly,  the  anterolateral wall and apex are hypokinetic.  This was not a finding on the  Cardiolite scan, and the LAD stenosis does not appear to me to be flow  limiting.  We may want to reevaluate him with an exercise stress Cardiolite  scan later to reassess the significance of the LAD stenosis after he has had  revascularization of his right coronary  artery.  Echocardiogram 05/25/2018:  - Left ventricle: The cavity size was normal. Wall thickness was  increased in a pattern of moderate LVH. There was mild focal  basal hypertrophy of the septum. Systolic function was normal.  The estimated ejection fraction was in the range of 50% to 55%.  Wall motion was normal; there were no regional wall motion  abnormalities. Doppler parameters are consistent with abnormal  left ventricular relaxation (grade 1 diastolic dysfunction).  - Aortic valve: Valve mobility was restricted. There was severe  stenosis. There was mild regurgitation.  - Mitral valve: There was mild regurgitation.  - Left atrium: The atrium was moderately dilated.  - Right ventricle: The cavity size was mildly dilated.  - Pulmonary arteries: PA peak pressure: 31 mm Hg (S).   Laboratory Data:  Chemistry Recent Labs  Lab 05/05/20 1948 05/15/2020 1101 05/07/20 0506  NA 137 138 134*  K 5.4* 4.8 4.2  CL 104 106 103  CO2 18* 16* 15*  GLUCOSE 116* 120* 87  BUN 77* 84* 80*  CREATININE 2.42* 2.07* 2.06*  CALCIUM 9.3 8.6* 8.5*  GFRNONAA 23* 28* 28*  GFRAA 27* 33* 33*  ANIONGAP 15 16* 16*    Total Protein  Date Value Ref Range Status  05/03/2020 6.1 (L) 6.5 - 8.1 g/dL Final   Albumin  Date Value Ref Range Status  05/23/2020 3.2 (L) 3.5 - 5.0 g/dL Final   AST  Date Value Ref Range Status  05/21/2020 50 (H) 15 - 41 U/L Final   ALT  Date Value Ref Range Status  05/15/2020 52 (H) 0 - 44 U/L Final   Alkaline Phosphatase  Date Value Ref Range Status  05/20/2020 155 (H) 38 - 126 U/L Final   Total Bilirubin  Date Value Ref Range Status  05/05/2020 2.1 (H) 0.3 - 1.2 mg/dL Final   Hematology Recent Labs  Lab 05/05/20 1948 04/28/2020 1101 05/07/20 0506  WBC 8.4 9.4 8.7  RBC 5.64 4.95 5.50  HGB 16.1 14.6 15.9  HCT 51.0 44.6 49.9  MCV 90.4 90.1 90.7  MCH 28.5 29.5 28.9  MCHC 31.6 32.7 31.9  RDW 15.3 15.9* 15.7*  PLT 262 229 245   Cardiac  EnzymesNo results for input(s): TROPONINI in the last 168 hours. No results for input(s): TROPIPOC in the last 168 hours.  BNP Recent Labs  Lab 05/03/2020 1101  BNP 3,817.0*    DDimer No results for input(s): DDIMER in the last 168 hours. TSH:  Lab Results  Component Value Date   TSH 3.114 05/08/2020   Lipids:No results found for: CHOL, HDL, LDLCALC, LDLDIRECT, TRIG, CHOLHDL HgbA1c:No results found for: HGBA1C  Radiology/Studies:  DG Chest 2 View  Result Date: 05/13/2020 CLINICAL DATA:  Shortness of breath, weakness and cough. EXAM: CHEST - 2 VIEW COMPARISON:  None. FINDINGS: The heart is borderline enlarged. There is tortuosity and calcification of the thoracic aorta. No acute pulmonary findings. No infiltrates, edema or effusions. The bony thorax is intact. IMPRESSION: No acute cardiopulmonary findings. Electronically Signed   By: Marijo Sanes M.D.   On: 04/25/2020 12:36   Assessment and Plan:   1. Acute on chronic diastolic CHF with new systolic dysfunction: -Previous EF on most recent echocardiogram with LVEF at 50-55% with no RWMA with focal hypertrophy of the septum, G1DD, severe AS with mild regurgitation, mild MR.  -Pt presented with HF symptoms including SOB, dyspnea on exertion and edema found to have an elevated BNP at >3000. -Repeat echocardiogram with EF at <20% with global hypokinesis, RV ventricular pressure at 67.76mmHg, mild to moderate MR, severe AS with VTI at 0.44cm and mean gradient at 13.55mmHg, Vmax measures 2.53 m/s. Dimensionless index is 0.15. The transaortic gradients are underestimated due to severe LV dysfunction. This is consistent with severe low flow, low gradient aortic stenosis.   -Creatinine elevated at 2.07 -hsT was 171>>153>>147>>148 with no anginal symptoms  -CXR with no acute cardiopulmonary findings.  -EKG with NSR and LBBB which appears to be present on prior tracings -Daily weight, strict I&O -Weight, 170lb today  -Net negative 469ml  -Continue  IV Lasix 40mg  however need to be mindful of poor output -Pt needs inotrope therapy and AHF consultation with transfer to [facility] however no beds currently available  2. Hx of CAD sp PCI to LAD 2012: -Pt underwent cath 2012 with PCI to LAD/possibly to RCA given conflicting cath report and discharge notes. He has been maintained in ASA and Plavix therapies without anginal symptoms -Creatinine was 2.07, AST/ALT was 50/52. BNP found to be markedly elevated at 3817  3. Severe aortic calcifications: -Per last echocardiogram with moderate aortic stenosis and no symptoms suggestive of critical progression however will assess with pending echo results  -Unfortunately repeat echo today with severe calcifcation of the  aortic valve. No aortic stenosis is present. Aortic valve area, by VTI measures 0.44 cm. Aortic valve mean gradient measures 13.5 mmHg. Aortic valve Vmax measures 2.53  m/s.  Dimensionless index is 0.15. The transaortic gradients are  underestimated due to severe LV dysfunction>>consistent with  severe low flow, low gradient aortic stenosis.   -Denies dizziness, no syncope  -Will contact TAVR team for possible workup near transfer to [facility]    4. AKI: -Creatinine, 2.06 today with a baseline that appears to be in the 1.3 range based on labs from PCP  -Likely secondary to excessive fluid volume>>follow response with IV Lasix    5. LBBB: -Per chart review, appears this has been present at least since 2019  6. HTN: -Stable today although was fairly hypotensive on EMS arrival -BP, 113/77>105/73>>99/71 -Follow closely given IV diuretics and poor CO    Other issues per primary team include: -Anhedonia with adjustment disorder -COPD -Neoplasm of skin  -Diverticulosis   For questions or updates, please contact [facility] Please consult [email or web address] for contact info under Cardiology/STEMI.   SignedKathyrn Drown NP-C [facility] Pager: [phone] 05/07/2020 2:26 PM  I have seen  and examined the patient along with Kathyrn Drown NP-C.  I have reviewed the chart, notes and new data.  I agree with their note.  Key new complaints: severe dyspnea with minimal activity, edema, no angina in years Key examination changes: Cheyne-Stokes breathing, no acute distress, sitting up in bed, JVP 10 cm, irregular rhythm (Mobitz 1 AVB), late peaking aortic ejection murmur with muffled S2, cool toes, but warmish ears and nose Key new findings / data: sinus rhythm with very long 1st degree AVB and LBBB on ECG (2nd degree Mobitz 1 AV block occasionally on telemetry, without bradycardia); echo shows critical low flow low gradient aortic stenosis with severely decreased LAVEF and some regional WMA (in my opinion anterior wall and apex are disproportionately hypokinetic). sPAP in 60s, but preserved RV function. Compared to echo Oct 2019, LVEF has drastically worsened and AS has progressed.  PLAN: Severe low flow low gradient aortic stenosis on background of CAD. Appear "cold (borderline) and wet (definitely)". Low cardiac output clinically and by echo. Hypervolemic. Some improvement in renal parameters after diuresis is encouraging. Chronicity of LV dysfunction is unclear. I wonder if he acutely worsened after wife's demise due to stress cardiomyopathy (takotsubo sd.). Similarly, unclear how chronic his renal dysfunction is (labs from 2017 show creatinine of 1.3). He is not a candidate for surgical AVR, but if we can achieve improved hemodynamic compensation , he may become a marginal candidate for TAVR. I believe he will need inotrope support to achieve this and would prefer care at [facility], enlisting the assistance of the advanced heart failure team. He does have significant AV and intraventricular conduction abnormalities (long PR, intermittent Mobitz type 1 AV block and LBBB) and would be at increased risk for complete heart block and pacemaker after TAVR. If he is deemed to not be a candidate  for TAVR, his prognosis is grim and palliative care services should be involved. The patient's nephew Octavia Bruckner (in the room) and his sister Opal Sidles (by phone) confirm that he has been active and fit until recently and they participated in our discussion re: care plan.  Sanda Klein, MD, [place] [phone] 05/07/2020, 4:59 PM

## 2020-05-07 NOTE — Consult Note (Signed)
Patient was seen and case discussed with Dr. Dwyane Dee. Patient was admitted with severe aortic stenosis, CAD, diverticulosis, lumbar spinal stenosis, weakness, and failure to thrive for 2 weeks after the death of his wife.  He does report some general depressive symptoms to include poor sleep, decreased appetite, and general malaise.  He appears to be upbeat and in good spirits, and denies any suicidal thoughts.  He denies any previous or existing history of psychiatric conditions.  He denies any previous outpatient or inpatient services.  He denies any history of psychosis, mood disorder, and or depressive disorder.  He does express general feelings around debridement, which are normal and to be expected after his wife of 110 years passed away 2 weeks ago secondary to MVC. He does appear to be hopeful and motivated towards treatment, and understands his current condition that brought him into the hospital is related to a leaking valve in his heart.  He does have a friend at his bedside, who appears to be part of his support system.  He does inquire about medication for sleep, reports this is ongoing prior to his wife passing.  He has not tried any new medications for insomnia.  Did discuss with patient about starting a as needed medication for sleep and he verbalizes understanding.  He states that this medication works he would like this medication to be sent [facility].  Will psychiatrically cleared patient at this time.  -We will place consult for chaplain or spiritual care services -Will place order for hydroxyzine 25 p.o. nightly as needed for insomnia -Psychiatry to sign off at this time.

## 2020-05-07 NOTE — Progress Notes (Signed)
PROGRESS NOTE    [name] [name]  ZYY:[id number] DOB: [birth year] DOA: 05/12/2020 PCP: Burnard Bunting, MD  Brief Narrative:  86 year old white male who lives alone at home CAD status post PCI  06/2005, prior EF 45% with improvement to 55/60% 09/12/2016 with moderate AOS elevated PA pressure Trifascicular block Prostate CA status post radical prostatectomy/lymph node dissection 2002 Squamous cell CA followed at dermatology office status post Mohs microsurgery 04/13/2019 Prior diverticulosis tubular adenomas CKD three Hyperlipidemia HTN? Asthma/COPD erectile dysfunction Wife recently passed away 11/09/22 ago-generalized failure to thrive in those 3 months-initial visit ED 05/05/2020 secondary to shortness of breath fatigue Was told at PCP office allergies, started a new inhaler but refused to stay because of long wait  EMS called out again 04/26/2020 secondary to failure to thrive-found to have blood pressure 98/72? A. fib on monitor  In the emergency room evaluated found to have lower extremity swelling Work-up = BUNs/creatinine 84/2.07, slightly elevated LFTs BNP 3817 troponin 71 TSH 3.1-initial EKG LBBB  Cardiology input pending for elevated troponin severe AOS  Assessment & Plan:   Principal Problem:   Elevated troponin Active Problems:   HYPERTENSION, BENIGN   CAD, NATIVE VESSEL   Aortic stenosis, severe   Weakness   Fall   COPD (chronic obstructive pulmonary disease) ([place])   1. DOE HFpEF with acute exacerbation, BNP >3000 a. Takotsubo versus's progression of aortic stenosis given history? b. Verbal report of echo shows depressed EF although formal report pending c. He will need some diuresis and I will give daily Lasix 40 mg IV now and this will probably be further adjusted by cardiology d. Given undefined echo-holding ARB 2. LBBB chronic with elevated troponin but flat trend 3. Moderate AOS in the past\  As above--does have a murmur but is soft 2/6 and difficult to  appreciate 4. AKI superimposed CKD 3B 5. Possibility of cardiorenal syndrome type I versus type III 6. Mild hyperkalemia on admission 7. metabolic acidosis probably secondary to starvation a. Creatinine has improved slightly since admission-however low creatinine portends poorly if this is indeed cardiorenal syndrome b. Cautious diuresis given grade 3 edema and cardiorenal syndrome c. Repeat labs a.m. d. If persisting metabolic abnormalities would consider addition of bicarb e. Family at bedside reports not eating at all and has lost weight--his LFTs are slightly elevated his albumin is low indicating the same f. Will need supplementation, will also need LFTs which we will order for the morning g. We will BladderScan him now to ensure he does not have acute retention of urine  8. Anhedonia/adjustment disorder versus major depressive disorder and grief reaction 2/2 death of wife recently a. Patient declined to get counseling but psychiatry b. D/W nursing he needs this given his his severe adjustment disorder/grief disorder c. In the interim we will start Effexor 37.5 9. ? Fall prior to admission a. Has bruise on right arm which seems stable 10. COPD without acute exacerbation a. Continue DuoNeb every 6 as needed Dulera 2 puffs twice daily b. No acute component at this time 11. Prior radical prostatectomy 2002 a. Data scan as above-outpatient PSA and further testing as per PCP 12. Neoplasm skin of left shoulder status post Mohs surgery 13. Diverticulosis, prior tubular adenomas with hemorrhoids a. May use as needed hemorrhoidal cream  DVT prophylaxis: Lovenox Code Status: DNR confirmed Family Communication:  Nephew at bedside discussed with them   Status is: Inpatient  Remains inpatient appropriate because:Hemodynamically unstable, Persistent severe electrolyte disturbances, Ongoing active pain requiring inpatient pain  management, Unsafe d/c plan and IV treatments appropriate due to  intensity of illness or inability to take PO   Dispo: The patient is from: Home              Anticipated d/c is to: SNF              Anticipated d/c date is: 2 days              Patient currently is not medically stable to d/c.       Consultants:   Cardiology  Procedures: Echocardiogram pending  Antimicrobials: None at this time   Subjective: Quite depressed-cannot get him to answer questions about his own health instead talks to me regarding the entire history leading up to his wife's death 3 months ago Nephew walks into the room and tells me he has been short of breath for several weeks with significant failure to thrive not eating and quite dejected He has also lost weight despite becoming more swollen according to nephew   Objective: Vitals:   05/13/2020 2049 05/12/2020 2100 05/07/20 0108 05/07/20 0519  BP: 102/72  99/71 105/73  Pulse: 96  92 94  Resp: 20  20 18   Temp: (!) 97.5 F (36.4 C)  97.6 F (36.4 C) 97.6 F (36.4 C)  TempSrc: Axillary  Axillary Oral  SpO2: 95% 92% 91% 98%  Weight: 77.3 kg     Height: 5\' 10"  (1.778 m)       Intake/Output Summary (Last 24 hours) at 05/07/2020 0730 Last data filed at 05/07/2020 0519 Gross per 24 hour  Intake --  Output 700 ml  Net -700 ml   Filed Weights   04/28/2020 1114 04/27/2020 2049  Weight: 79.4 kg 77.3 kg    Examination: Flat affect EOMI NCAT General exam: No icterus no pallor neck soft supple  Respiratory system: Clear no rales rhonchi or added sound Cardiovascular system: Slight elevation JVD Sinus tach/sinus arrhythmia?  Systolic murmur LUSB 2/6 although difficult to appreciate Gastrointestinal system: Soft no rebound. Central nervous system: Neurologically intact moving all 4 limbs without deficit Extremities: Grade 2-3 lower extremity edema pitting range of motion of most joints is intact but he is weak Skin:  Psychiatry: Very flat affect  Data Reviewed: I have personally reviewed following labs and  imaging studies BUNs/creatinine 84/2.07-->80/2.06 CO2 15 Anion gap 16 Hemoglobin 13.9, WBC 8.7, platelet 245  Radiology Studies: DG Chest 2 View  Result Date: 05/03/2020 CLINICAL DATA:  Shortness of breath, weakness and cough. EXAM: CHEST - 2 VIEW COMPARISON:  None. FINDINGS: The heart is borderline enlarged. There is tortuosity and calcification of the thoracic aorta. No acute pulmonary findings. No infiltrates, edema or effusions. The bony thorax is intact. IMPRESSION: No acute cardiopulmonary findings. Electronically Signed   By: Marijo Sanes M.D.   On: 05/05/2020 12:36     Scheduled Meds: . aspirin EC  81 mg Oral Daily  . enoxaparin (LOVENOX) injection  30 mg Subcutaneous Q24H  . furosemide  40 mg Intravenous Daily  . mometasone-formoterol  2 puff Inhalation BID  . omega-3 acid ethyl esters  1 g Oral Daily  . sodium chloride flush  3 mL Intravenous Q12H   Continuous Infusions: . sodium chloride       LOS: 1 day    Time spent: [place], MD [facility] To contact the attending provider between 7A-7P or the covering provider during after hours 7P-7A, please log into the web site [email or web address] and access using  universal [place] password for that web site. If you do not have the password, please call the hospital operator.  05/07/2020, 7:30 AM

## 2020-05-08 ENCOUNTER — Inpatient Hospital Stay (HOSPITAL_COMMUNITY): Payer: Medicare Other

## 2020-05-08 ENCOUNTER — Inpatient Hospital Stay: Payer: Self-pay

## 2020-05-08 LAB — CBC WITH DIFFERENTIAL/PLATELET
Abs Immature Granulocytes: 0.03 10*3/uL (ref 0.00–0.07)
Basophils Absolute: 0 10*3/uL (ref 0.0–0.1)
Basophils Relative: 0 %
Eosinophils Absolute: 0 10*3/uL (ref 0.0–0.5)
Eosinophils Relative: 0 %
HCT: 53.5 % — ABNORMAL HIGH (ref 39.0–52.0)
Hemoglobin: 17 g/dL (ref 13.0–17.0)
Immature Granulocytes: 0 %
Lymphocytes Relative: 9 %
Lymphs Abs: 0.7 10*3/uL (ref 0.7–4.0)
MCH: 28.8 pg (ref 26.0–34.0)
MCHC: 31.8 g/dL (ref 30.0–36.0)
MCV: 90.7 fL (ref 80.0–100.0)
Monocytes Absolute: 0.8 10*3/uL (ref 0.1–1.0)
Monocytes Relative: 9 %
Neutro Abs: 7.1 10*3/uL (ref 1.7–7.7)
Neutrophils Relative %: 82 %
Platelets: 264 10*3/uL (ref 150–400)
RBC: 5.9 MIL/uL — ABNORMAL HIGH (ref 4.22–5.81)
RDW: 16.3 % — ABNORMAL HIGH (ref 11.5–15.5)
WBC: 8.7 10*3/uL (ref 4.0–10.5)
nRBC: 0 % (ref 0.0–0.2)

## 2020-05-08 LAB — COMPREHENSIVE METABOLIC PANEL
ALT: 57 U/L — ABNORMAL HIGH (ref 0–44)
AST: 59 U/L — ABNORMAL HIGH (ref 15–41)
Albumin: 3.1 g/dL — ABNORMAL LOW (ref 3.5–5.0)
Alkaline Phosphatase: 185 U/L — ABNORMAL HIGH (ref 38–126)
Anion gap: 17 — ABNORMAL HIGH (ref 5–15)
BUN: 94 mg/dL — ABNORMAL HIGH (ref 8–23)
CO2: 17 mmol/L — ABNORMAL LOW (ref 22–32)
Calcium: 8.7 mg/dL — ABNORMAL LOW (ref 8.9–10.3)
Chloride: 102 mmol/L (ref 98–111)
Creatinine, Ser: 2.23 mg/dL — ABNORMAL HIGH (ref 0.61–1.24)
GFR calc Af Amer: 30 mL/min — ABNORMAL LOW (ref >60)
GFR calc non Af Amer: 26 mL/min — ABNORMAL LOW (ref >60)
Glucose, Bld: 112 mg/dL — ABNORMAL HIGH (ref 70–99)
Potassium: 4.6 mmol/L (ref 3.5–5.1)
Sodium: 136 mmol/L (ref 135–145)
Total Bilirubin: 2.3 mg/dL — ABNORMAL HIGH (ref 0.3–1.2)
Total Protein: 6 g/dL — ABNORMAL LOW (ref 6.5–8.1)

## 2020-05-08 LAB — LIPID PANEL
Cholesterol: 106 mg/dL (ref 0–200)
HDL: 31 mg/dL — ABNORMAL LOW (ref >40)
LDL Cholesterol: 57 mg/dL (ref 0–99)
Total CHOL/HDL Ratio: 3.4 RATIO
Triglycerides: 90 mg/dL (ref <150)
VLDL: 18 mg/dL (ref 0–40)

## 2020-05-08 LAB — MAGNESIUM: Magnesium: 2.7 mg/dL — ABNORMAL HIGH (ref 1.7–2.4)

## 2020-05-08 MED ORDER — CHLORHEXIDINE GLUCONATE CLOTH 2 % EX PADS
6.0000 | MEDICATED_PAD | Freq: Every day | CUTANEOUS | Status: DC
  Administered 2020-05-08 – 2020-05-15 (×8): 6 via TOPICAL

## 2020-05-08 MED ORDER — SODIUM CHLORIDE 0.9% FLUSH
10.0000 mL | Freq: Two times a day (BID) | INTRAVENOUS | Status: DC
  Administered 2020-05-08 – 2020-05-13 (×11): 10 mL
  Administered 2020-05-14: 40 mL
  Administered 2020-05-14 – 2020-05-15 (×2): 10 mL

## 2020-05-08 MED ORDER — WITCH HAZEL-GLYCERIN EX PADS
MEDICATED_PAD | CUTANEOUS | Status: DC | PRN
  Administered 2020-05-14 (×2): 1 via TOPICAL
  Filled 2020-05-08 (×2): qty 100

## 2020-05-08 MED ORDER — SODIUM CHLORIDE 0.9% FLUSH: 10.0000 mL | INTRAVENOUS | Status: DC | PRN

## 2020-05-08 NOTE — Progress Notes (Signed)
Peripherally Inserted Central Catheter Placement  The IV Nurse has discussed with the patient and/or persons authorized to consent for the patient, the purpose of this procedure and the potential benefits and risks involved with this procedure.  The benefits include less needle sticks, lab draws from the catheter, and the patient may be discharged home with the catheter. Risks include, but not limited to, infection, bleeding, blood clot (thrombus formation), and puncture of an artery; nerve damage and irregular heartbeat and possibility to perform a PICC exchange if needed/ordered by physician.  Alternatives to this procedure were also discussed.  Bard Power PICC patient education guide, fact sheet on infection prevention and patient information card has been provided to patient /or left at bedside.    PICC Placement Documentation  PICC Double Lumen 05/08/20 PICC Right Brachial 39 cm 0 cm (Active)  Indication for Insertion or Continuance of Line Chronic illness with exacerbations (CF, Sickle Cell, etc.) 05/08/20 1423  Exposed Catheter (cm) 0 cm 05/08/20 1423  Site Assessment Clean;Dry;Intact 05/08/20 1423  Lumen #1 Status Flushed;Saline locked;Blood return noted 05/08/20 1423  Lumen #2 Status Flushed;Saline locked;Blood return noted 05/08/20 1423  Dressing Type Transparent 05/08/20 1423  Dressing Status Clean;Dry;Intact;Antimicrobial disc in place 05/08/20 1423  Safety Lock Not Applicable 90/12/22 4114  Dressing Intervention New dressing 05/08/20 1423  Dressing Change Due 05/15/20 05/08/20 1423       Gordan Payment 05/08/2020, 2:25 PM

## 2020-05-08 NOTE — Progress Notes (Signed)
PROGRESS NOTE    [name] [name]  NOB:[id number] DOB: 11/12/1931 DOA: 04/29/2020 PCP: Burnard Bunting, MD    Chief Complaint  Patient presents with  . Cough    Brief Narrative:  Wife recently passed away 12-03-2022 ago-generalized failure to thrive in those 3 months-initial visit ED 05/05/2020 secondary to shortness of breath fatigue  EMS called out  Again on 05/23/2020 secondary to failure to thrive-found to have blood pressure 98/72? A. fib on monitor  In the emergency room evaluated found to have lower extremity swelling Work-up = BUNs/creatinine 84/2.07, slightly elevated LFTs BNP 3817 troponin 71 TSH 3.1-initial EKG LBBB He is admitted for heart failure treatment, he is cold and wet, echo lvef 20%, This is consistent with  severe low flow, low gradient aortic stenosis cardiology recommend transfer to [place] for ionotropic and possible TAVR  Subjective:  He denies pain, no hypoxia at rest, his main complaints is feeling very tired  Assessment & Plan:   Principal Problem:   Elevated troponin Active Problems:   HYPERTENSION, BENIGN   CAD, NATIVE VESSEL   Aortic stenosis, severe   Weakness   Fall   COPD (chronic obstructive pulmonary disease) (HCC)   Pressure injury of skin   Acute on chronic combined heart failure/severe aortic stenosis -EF this admission < 20% with global hypokinesis, elevated RV pressure, severe AS with mean gradient 13.5 mm Hg - suspect low flow AS --BMP 3800 on presentation, high-sensitivity troponin I 71on presentation, today 148 -He started on IV Lasix, urine output 500 cc last 24 hours not sure if accurate, creatinine fluctuating -He is need to be transferred to [facility] for heart failure management, awaiting for bed, appreciate cardiology input  AKI?/Metabolic acidosis -Unclear baseline creatinine level -Creatinine 2.4-2.07-2.06-2.23 -UA rare bacteria ,no WBC, no RBC, negative nitrite, positive protein - get renal ultrasound and bladder  scan, currently has a external urinary catheter on -Suspect cardiorenal syndrome -Renal dosing meds  Elevated LFT -Likely due to liver congestion in the setting of acute heart failure  History of hypertension, now in acute heart failure, blood pressure low normal Home BP meds Norvasc, Lotensin held since admission  CAD S/P PCI to LAD/RCA (2012) - hs troponin 171 --> 153 --> 147 --> 148 -LDL 57, HDL 31, triglycerides 90, he is on fish oil  And Vytorin at home - on ASA  -Denies chest pain  COPD without acute exacerbation -No wheezing, no hypoxia -Continue home meds  H/o prostate cancer s/p Radical retropubic prostatectomy and bilateral pelvic lymph node dissection December 13, 2000  Depression with poor sleep, decreased appetite, generalized malaise, started 2 weeks after death of his wife from motor vehicle accident -He denies suicidal ideation -Appreciate psychiatry input -Started on hydroxyzine 25 mg nightly as needed for insomnia  DVT prophylaxis: enoxaparin (LOVENOX) injection 30 mg Start: 04/26/2020 1800   Code Status: DNR Family Communication: called sister x2 , no answer Disposition:   Status is: Inpatient  Dispo: The patient is from: home              Anticipated d/c is to: TBD              Anticipated d/c date is:               Patient currently is inpatient status appropriate due to severity of the disease, he needs to be transferred to [facility] for heart failure management and possible TAVR for severe aortic stenosis  Consultants:   Cardiology  Psychiatry  on 9/14  Procedures:   None  Antimicrobials:   None     Objective: Vitals:   05/07/20 2001 05/07/20 2042 05/08/20 0503 05/08/20 0801  BP:  99/85 104/81   Pulse:  84 85   Resp:  20 (!) 22   Temp:  98.1 F (36.7 C) 98.1 F (36.7 C)   TempSrc:      SpO2: 98% 98% 99% 95%  Weight:   76.4 kg   Height:        Intake/Output Summary (Last 24 hours) at 05/08/2020 1124 Last data filed at  05/08/2020 0743 Gross per 24 hour  Intake 1650 ml  Output 1250 ml  Net 400 ml   Filed Weights   05/04/2020 1114 05/07/2020 2049 05/08/20 0503  Weight: 79.4 kg 77.3 kg 76.4 kg    Examination:  General exam: frail, weak, aaox3 Respiratory system: diminished at basis, but no wheezing , no rales, no rhonchi, Respiratory effort normal. Cardiovascular system: S1 & S2 heard, irregular  Gastrointestinal system: Abdomen is nondistended, soft and nontender. No organomegaly or masses felt. Normal bowel sounds heard. Central nervous system: Alert and oriented. No focal neurological deficits. Extremities: generalized weakness Skin: No rashes, lesions or ulcers Psychiatry: Judgement and insight appear normal. Flat affect    Data Reviewed: I have personally reviewed following labs and imaging studies  CBC: Recent Labs  Lab 05/05/20 1948 04/25/2020 1101 05/07/20 0506 05/08/20 0502  WBC 8.4 9.4 8.7 8.7  NEUTROABS  --   --   --  7.1  HGB 16.1 14.6 15.9 17.0  HCT 51.0 44.6 49.9 53.5*  MCV 90.4 90.1 90.7 90.7  PLT 262 229 245 503    Basic Metabolic Panel: Recent Labs  Lab 05/05/20 1948 05/15/2020 1101 05/07/20 0506 05/08/20 0502  NA 137 138 134* 136  K 5.4* 4.8 4.2 4.6  CL 104 106 103 102  CO2 18* 16* 15* 17*  GLUCOSE 116* 120* 87 112*  BUN 77* 84* 80* 94*  CREATININE 2.42* 2.07* 2.06* 2.23*  CALCIUM 9.3 8.6* 8.5* 8.7*  MG  --   --   --  2.7*    GFR: Estimated Creatinine Clearance: 24.6 mL/min (A) (by C-G formula based on SCr of 2.23 mg/dL (H)).  Liver Function Tests: Recent Labs  Lab 05/14/2020 1101 05/08/20 0502  AST 50* 59*  ALT 52* 57*  ALKPHOS 155* 185*  BILITOT 2.1* 2.3*  PROT 6.1* 6.0*  ALBUMIN 3.2* 3.1*    CBG: No results for input(s): GLUCAP in the last 168 hours.   Recent Results (from the past 240 hour(s))  SARS Coronavirus 2 by RT PCR (hospital order, performed in [facility] hospital lab) Nasopharyngeal Nasopharyngeal Swab     Status: None   Collection  Time: 05/12/2020 11:07 AM   Specimen: Nasopharyngeal Swab  Result Value Ref Range Status   SARS Coronavirus 2 NEGATIVE NEGATIVE Final    Comment: (NOTE) SARS-CoV-2 target nucleic acids are NOT DETECTED.  The SARS-CoV-2 RNA is generally detectable in upper and lower respiratory specimens during the acute phase of infection. The lowest concentration of SARS-CoV-2 viral copies this assay can detect is 250 copies / mL. A negative result does not preclude SARS-CoV-2 infection and should not be used as the sole basis for treatment or other patient management decisions.  A negative result may occur with improper specimen collection / handling, submission of specimen other than nasopharyngeal swab, presence of viral mutation(s) within the areas targeted by this assay, and inadequate number of viral  copies (<250 copies / mL). A negative result must be combined with clinical observations, patient history, and epidemiological information.  Fact Sheet for Patients:   StrictlyIdeas.no  Fact Sheet for Healthcare Providers: BankingDealers.co.za  This test is not yet approved or  cleared by the [place] FDA and has been authorized for detection and/or diagnosis of SARS-CoV-2 by FDA under an Emergency Use Authorization (EUA).  This EUA will remain in effect (meaning this test can be used) for the duration of the COVID-19 declaration under Section 564(b)(1) of the Act, 21 U.S.C. section 360bbb-3(b)(1), unless the authorization is terminated or revoked sooner.  Performed at [facility], [place] [address], [place], [place] [address]          Radiology Studies: DG Chest 2 View  Result Date: 05/17/2020 CLINICAL DATA:  Shortness of breath, weakness and cough. EXAM: CHEST - 2 VIEW COMPARISON:  None. FINDINGS: The heart is borderline enlarged. There is tortuosity and calcification of the thoracic aorta. No acute pulmonary findings.  No infiltrates, edema or effusions. The bony thorax is intact. IMPRESSION: No acute cardiopulmonary findings. Electronically Signed   By: Marijo Sanes M.D.   On: 05/13/2020 12:36   ECHOCARDIOGRAM COMPLETE  Result Date: 05/07/2020    ECHOCARDIOGRAM REPORT   Patient Name:   [name] [name] Date of Exam: 05/07/2020 Medical Rec #:  [id number]      Height:       70.0 in Accession #:    [id number]     Weight:       170.4 lb Date of Birth:  02-08-1932       BSA:          1.950 m Patient Age:    32 years       BP:           102/79 mmHg Patient Gender: M              HR:           99 bpm. Exam Location:  Inpatient Procedure: 2D Echo and Intracardiac Opacification Agent Indications:    Aortic Stenosis 424.1 / 135.0  History:        Patient has prior history of Echocardiogram examinations, most                 recent 05/25/2018. CAD; Risk Factors:Hypertension and                 Dyslipidemia. Wife recently passed away 2 weeks ago.  Sonographer:    Darlina Sicilian RDCS Referring Phys: [id number] [place] AGBATA IMPRESSIONS  1. Left ventricular ejection fraction, by estimation, is <20%. The left ventricle has severely decreased function. The left ventricle demonstrates global hypokinesis. There is mild left ventricular hypertrophy of the basal-septal segment. Left ventricular diastolic parameters are indeterminate. The average left ventricular global longitudinal strain is -2.8 %. The global longitudinal strain is abnormal.  2. Right ventricular systolic function is normal. The right ventricular size is normal. There is severely elevated pulmonary artery systolic pressure. The estimated right ventricular systolic pressure is 26.3 mmHg.  3. The mitral valve is normal in structure. Mild to moderate mitral valve regurgitation. No evidence of mitral stenosis.  4. The aortic valve is tricuspid. There is severe calcifcation of the aortic valve. There is severe thickening of the aortic valve. Aortic valve regurgitation is mild. No aortic  stenosis is present. Aortic valve area, by VTI measures 0.44 cm. Aortic valve mean gradient measures 13.5 mmHg. Aortic valve  Vmax measures 2.53 m/s. Dimensionless index is 0.15. The transaortic gradients are underestimated due to severe LV dysfunction. This is consistent with severe low flow, low gradient aortic stenosis.  5. The inferior vena cava is normal in size with <50% respiratory variability, suggesting right atrial pressure of 8 mmHg. FINDINGS  Left Ventricle: Left ventricular ejection fraction, by estimation, is <20%. The left ventricle has severely decreased function. The left ventricle demonstrates global hypokinesis. Definity contrast agent was given IV to delineate the left ventricular endocardial borders. The average left ventricular global longitudinal strain is -2.8 %. The global longitudinal strain is abnormal. The left ventricular internal cavity size was normal in size. There is mild left ventricular hypertrophy of the basal-septal segment. Left ventricular diastolic parameters are indeterminate. Right Ventricle: The right ventricular size is normal. No increase in right ventricular wall thickness. Right ventricular systolic function is normal. There is severely elevated pulmonary artery systolic pressure. The tricuspid regurgitant velocity is 3.86 m/s, and with an assumed right atrial pressure of 8 mmHg, the estimated right ventricular systolic pressure is 25.9 mmHg. Left Atrium: Left atrial size was normal in size. Right Atrium: Right atrial size was normal in size. Pericardium: There is no evidence of pericardial effusion. Mitral Valve: The mitral valve is normal in structure. There is mild calcification of the mitral valve leaflet(s). Mild to moderate mitral annular calcification. Mild to moderate mitral valve regurgitation. No evidence of mitral valve stenosis. Tricuspid Valve: The tricuspid valve is normal in structure. Tricuspid valve regurgitation is mild . No evidence of tricuspid  stenosis. Aortic Valve: The aortic valve is tricuspid. There is severe calcifcation of the aortic valve. There is severe thickening of the aortic valve. There is severe aortic valve annular calcification. Aortic valve regurgitation is mild. No aortic stenosis is present. Aortic valve mean gradient measures 13.5 mmHg. Aortic valve peak gradient measures 25.6 mmHg. Aortic valve area, by VTI measures 0.44 cm. Pulmonic Valve: The pulmonic valve was normal in structure. Pulmonic valve regurgitation is mild. No evidence of pulmonic stenosis. Aorta: The aortic root is normal in size and structure. Venous: The inferior vena cava is normal in size with less than 50% respiratory variability, suggesting right atrial pressure of 8 mmHg. IAS/Shunts: The interatrial septum appears to be lipomatous. No atrial level shunt detected by color flow Doppler.  LEFT VENTRICLE PLAX 2D LVIDd:         4.70 cm LVIDs:         4.00 cm  2D Longitudinal Strain LV PW:         1.00 cm  2D Strain GLS (A2C):   -2.3 % LV IVS:        1.30 cm  2D Strain GLS (A3C):   -2.2 % LVOT diam:     1.90 cm  2D Strain GLS (A4C):   -4.0 % LV SV:         19       2D Strain GLS Avg:     -2.8 % LV SV Index:   10 LVOT Area:     2.84 cm                          3D Volume EF:                         3D EF:        19 %  LV EDV:       224 ml                         LV ESV:       182 ml                         LV SV:        42 ml LEFT ATRIUM             Index       RIGHT ATRIUM           Index LA diam:        4.50 cm 2.31 cm/m  RA Area:     12.90 cm LA Vol (A2C):   48.7 ml 24.97 ml/m RA Volume:   31.40 ml  16.10 ml/m LA Vol (A4C):   57.4 ml 29.44 ml/m LA Biplane Vol: 54.3 ml 27.85 ml/m  AORTIC VALVE AV Area (Vmax):    0.44 cm AV Area (Vmean):   0.41 cm AV Area (VTI):     0.44 cm AV Vmax:           253.00 cm/s AV Vmean:          167.500 cm/s AV VTI:            0.444 m AV Peak Grad:      25.6 mmHg AV Mean Grad:      13.5 mmHg LVOT Vmax:          39.20 cm/s LVOT Vmean:        24.400 cm/s LVOT VTI:          0.069 m LVOT/AV VTI ratio: 0.15  AORTA Ao Asc diam: 3.40 cm TRICUSPID VALVE TR Peak grad:   59.6 mmHg TR Vmax:        386.00 cm/s  SHUNTS Systemic VTI:  0.07 m Systemic Diam: 1.90 cm Fransico Him MD Electronically signed by Fransico Him MD Signature Date/Time: 05/07/2020/3:33:37 PM    Final    [place] EKG SITE RITE  Result Date: 05/08/2020 If Site Rite image not attached, placement could not be confirmed due to current cardiac rhythm.       Scheduled Meds: . aspirin EC  81 mg Oral Daily  . enoxaparin (LOVENOX) injection  30 mg Subcutaneous Q24H  . furosemide  40 mg Intravenous Daily  . mometasone-formoterol  2 puff Inhalation BID  . omega-3 acid ethyl esters  1 g Oral Daily  . sodium chloride flush  3 mL Intravenous Q12H   Continuous Infusions: . sodium chloride       LOS: 2 days   Time spent: 15mins Greater than 50% of this time was spent in counseling, explanation of diagnosis, planning of further management, and coordination of care.  I have personally reviewed and interpreted on  05/08/2020 daily labs, tele strips, imagings as discussed above under date review session and assessment and plans.  I reviewed all nursing notes, pharmacy notes, consultant notes,  vitals, pertinent old records  I have discussed plan of care as described above with RN , patient  on 05/08/2020  Voice Recognition /Dragon dictation system was used to create this note, attempts have been made to correct errors. Please contact the author with questions and/or clarifications.   Florencia Reasons, MD PhD FACP [facility]  Available via Epic secure chat 7am-7pm for nonurgent issues Please page for urgent issues To page the attending provider  between 7A-7P or the covering provider during after hours 7P-7A, please log into the web site [email or web address] and access using universal [place] password for that web site. If you do not have the password,  please call the hospital operator.    05/08/2020, 11:24 AM

## 2020-05-08 NOTE — Progress Notes (Signed)
Pt alert and aware in bed he said that he might have gotten a couple hours sleep. He said that he would be going to [facility] for an evaluation for his heart. The chaplain offered caring and supportive presence and listening ear. I offered prayers and blessings.

## 2020-05-08 NOTE — Progress Notes (Addendum)
Progress Note  Patient Name: [name] [name] Date of Encounter: 05/08/2020  [facility] Cardiologist: Jenkins Rouge, MD   Subjective   Pt perseverates on his wife. No complaints. Lying flat, no [place].  Inpatient Medications    Scheduled Meds: . aspirin EC  81 mg Oral Daily  . enoxaparin (LOVENOX) injection  30 mg Subcutaneous Q24H  . furosemide  40 mg Intravenous Daily  . mometasone-formoterol  2 puff Inhalation BID  . omega-3 acid ethyl esters  1 g Oral Daily  . sodium chloride flush  3 mL Intravenous Q12H   Continuous Infusions: . sodium chloride     PRN Meds: sodium chloride, acetaminophen **OR** acetaminophen, hydrOXYzine, ipratropium-albuterol, nitroGLYCERIN, ondansetron **OR** ondansetron (ZOFRAN) IV, sodium chloride flush   Vital Signs    Vitals:   05/07/20 1223 05/07/20 2001 05/07/20 2042 05/08/20 0503  BP: 102/79  99/85 104/81  Pulse: 99  84 85  Resp: 18  20 (!) 22  Temp: (!) 97.5 F (36.4 C)  98.1 F (36.7 C) 98.1 F (36.7 C)  TempSrc: Oral     SpO2: 98% 98% 98% 99%  Weight:    76.4 kg  Height:        Intake/Output Summary (Last 24 hours) at 05/08/2020 0747 Last data filed at 05/08/2020 0743 Gross per 24 hour  Intake 1890 ml  Output 1250 ml  Net 640 ml   Last 3 Weights 05/08/2020 05/11/2020 05/19/2020  Weight (lbs) 168 lb 6.9 oz 170 lb 6.7 oz 175 lb  Weight (kg) 76.4 kg 77.3 kg 79.379 kg      Telemetry    Sinus with PVCs, and evidence of Mobitz type 1, HR generally in the 70-90s - Personally Reviewed  ECG    No new tracings - Personally Reviewed  Physical Exam   GEN: No acute distress.   Neck: + JVD Cardiac: irregular rhythm, regular rate, soft murmyr Respiratory: diminished in bases GI: Soft, nontender, non-distended  MS: 1+ B LE edema; No deformity. Neuro:  Nonfocal  Psych: Normal affect   Labs    High Sensitivity Troponin:   Recent Labs  Lab 05/17/2020 1101 05/05/2020 1302 05/08/2020 1413 05/12/2020 1613  TROPONINIHS 171* 153* 147*  148*      Chemistry Recent Labs  Lab 05/07/2020 1101 05/07/20 0506 05/08/20 0502  NA 138 134* 136  K 4.8 4.2 4.6  CL 106 103 102  CO2 16* 15* 17*  GLUCOSE 120* 87 112*  BUN 84* 80* 94*  CREATININE 2.07* 2.06* 2.23*  CALCIUM 8.6* 8.5* 8.7*  PROT 6.1*  --  6.0*  ALBUMIN 3.2*  --  3.1*  AST 50*  --  59*  ALT 52*  --  57*  ALKPHOS 155*  --  185*  BILITOT 2.1*  --  2.3*  GFRNONAA 28* 28* 26*  GFRAA 33* 33* 30*  ANIONGAP 16* 16* 17*     Hematology Recent Labs  Lab 05/23/2020 1101 05/07/20 0506 05/08/20 0502  WBC 9.4 8.7 8.7  RBC 4.95 5.50 5.90*  HGB 14.6 15.9 17.0  HCT 44.6 49.9 53.5*  MCV 90.1 90.7 90.7  MCH 29.5 28.9 28.8  MCHC 32.7 31.9 31.8  RDW 15.9* 15.7* 16.3*  PLT 229 245 264    BNP Recent Labs  Lab 05/17/2020 1101  BNP 3,817.0*     DDimer No results for input(s): DDIMER in the last 168 hours.   Radiology    DG Chest 2 View  Result Date: 05/03/2020 CLINICAL DATA:  Shortness of  breath, weakness and cough. EXAM: CHEST - 2 VIEW COMPARISON:  None. FINDINGS: The heart is borderline enlarged. There is tortuosity and calcification of the thoracic aorta. No acute pulmonary findings. No infiltrates, edema or effusions. The bony thorax is intact. IMPRESSION: No acute cardiopulmonary findings. Electronically Signed   By: Marijo Sanes M.D.   On: 05/15/2020 12:36   ECHOCARDIOGRAM COMPLETE  Result Date: 05/07/2020    ECHOCARDIOGRAM REPORT   Patient Name:   [name] [name] Date of Exam: 05/07/2020 Medical Rec #:  [id number]      Height:       70.0 in Accession #:    [id number]     Weight:       170.4 lb Date of Birth:  29-Jun-1932       BSA:          1.950 m Patient Age:    86 years       BP:           102/79 mmHg Patient Gender: M              HR:           99 bpm. Exam Location:  Inpatient Procedure: 2D Echo and Intracardiac Opacification Agent Indications:    Aortic Stenosis 424.1 / 135.0  History:        Patient has prior history of Echocardiogram examinations, most                  recent 05/25/2018. CAD; Risk Factors:Hypertension and                 Dyslipidemia. Wife recently passed away 2 weeks ago.  Sonographer:    Darlina Sicilian RDCS Referring Phys: [id number] TOCHUKWU AGBATA IMPRESSIONS  1. Left ventricular ejection fraction, by estimation, is <20%. The left ventricle has severely decreased function. The left ventricle demonstrates global hypokinesis. There is mild left ventricular hypertrophy of the basal-septal segment. Left ventricular diastolic parameters are indeterminate. The average left ventricular global longitudinal strain is -2.8 %. The global longitudinal strain is abnormal.  2. Right ventricular systolic function is normal. The right ventricular size is normal. There is severely elevated pulmonary artery systolic pressure. The estimated right ventricular systolic pressure is 13.0 mmHg.  3. The mitral valve is normal in structure. Mild to moderate mitral valve regurgitation. No evidence of mitral stenosis.  4. The aortic valve is tricuspid. There is severe calcifcation of the aortic valve. There is severe thickening of the aortic valve. Aortic valve regurgitation is mild. No aortic stenosis is present. Aortic valve area, by VTI measures 0.44 cm. Aortic valve mean gradient measures 13.5 mmHg. Aortic valve Vmax measures 2.53 m/s. Dimensionless index is 0.15. The transaortic gradients are underestimated due to severe LV dysfunction. This is consistent with severe low flow, low gradient aortic stenosis.  5. The inferior vena cava is normal in size with <50% respiratory variability, suggesting right atrial pressure of 8 mmHg. FINDINGS  Left Ventricle: Left ventricular ejection fraction, by estimation, is <20%. The left ventricle has severely decreased function. The left ventricle demonstrates global hypokinesis. Definity contrast agent was given IV to delineate the left ventricular endocardial borders. The average left ventricular global longitudinal strain is -2.8 %. The  global longitudinal strain is abnormal. The left ventricular internal cavity size was normal in size. There is mild left ventricular hypertrophy of the basal-septal segment. Left ventricular diastolic parameters are indeterminate. Right Ventricle: The right ventricular size is normal. No increase in right ventricular  wall thickness. Right ventricular systolic function is normal. There is severely elevated pulmonary artery systolic pressure. The tricuspid regurgitant velocity is 3.86 m/s, and with an assumed right atrial pressure of 8 mmHg, the estimated right ventricular systolic pressure is 73.5 mmHg. Left Atrium: Left atrial size was normal in size. Right Atrium: Right atrial size was normal in size. Pericardium: There is no evidence of pericardial effusion. Mitral Valve: The mitral valve is normal in structure. There is mild calcification of the mitral valve leaflet(s). Mild to moderate mitral annular calcification. Mild to moderate mitral valve regurgitation. No evidence of mitral valve stenosis. Tricuspid Valve: The tricuspid valve is normal in structure. Tricuspid valve regurgitation is mild . No evidence of tricuspid stenosis. Aortic Valve: The aortic valve is tricuspid. There is severe calcifcation of the aortic valve. There is severe thickening of the aortic valve. There is severe aortic valve annular calcification. Aortic valve regurgitation is mild. No aortic stenosis is present. Aortic valve mean gradient measures 13.5 mmHg. Aortic valve peak gradient measures 25.6 mmHg. Aortic valve area, by VTI measures 0.44 cm. Pulmonic Valve: The pulmonic valve was normal in structure. Pulmonic valve regurgitation is mild. No evidence of pulmonic stenosis. Aorta: The aortic root is normal in size and structure. Venous: The inferior vena cava is normal in size with less than 50% respiratory variability, suggesting right atrial pressure of 8 mmHg. IAS/Shunts: The interatrial septum appears to be lipomatous. No atrial  level shunt detected by color flow Doppler.  LEFT VENTRICLE PLAX 2D LVIDd:         4.70 cm LVIDs:         4.00 cm  2D Longitudinal Strain LV PW:         1.00 cm  2D Strain GLS (A2C):   -2.3 % LV IVS:        1.30 cm  2D Strain GLS (A3C):   -2.2 % LVOT diam:     1.90 cm  2D Strain GLS (A4C):   -4.0 % LV SV:         19       2D Strain GLS Avg:     -2.8 % LV SV Index:   10 LVOT Area:     2.84 cm                          3D Volume EF:                         3D EF:        19 %                         LV EDV:       224 ml                         LV ESV:       182 ml                         LV SV:        42 ml LEFT ATRIUM             Index       RIGHT ATRIUM           Index LA diam:        4.50 cm 2.31 cm/m  RA  Area:     12.90 cm LA Vol (A2C):   48.7 ml 24.97 ml/m RA Volume:   31.40 ml  16.10 ml/m LA Vol (A4C):   57.4 ml 29.44 ml/m LA Biplane Vol: 54.3 ml 27.85 ml/m  AORTIC VALVE AV Area (Vmax):    0.44 cm AV Area (Vmean):   0.41 cm AV Area (VTI):     0.44 cm AV Vmax:           253.00 cm/s AV Vmean:          167.500 cm/s AV VTI:            0.444 m AV Peak Grad:      25.6 mmHg AV Mean Grad:      13.5 mmHg LVOT Vmax:         39.20 cm/s LVOT Vmean:        24.400 cm/s LVOT VTI:          0.069 m LVOT/AV VTI ratio: 0.15  AORTA Ao Asc diam: 3.40 cm TRICUSPID VALVE TR Peak grad:   59.6 mmHg TR Vmax:        386.00 cm/s  SHUNTS Systemic VTI:  0.07 m Systemic Diam: 1.90 cm Fransico Him MD Electronically signed by Fransico Him MD Signature Date/Time: 05/07/2020/3:33:37 PM    Final     Cardiac Studies   Echo 05/07/20:  1. Left ventricular ejection fraction, by estimation, is <20%. The left  ventricle has severely decreased function. The left ventricle demonstrates  global hypokinesis. There is mild left ventricular hypertrophy of the  basal-septal segment. Left  ventricular diastolic parameters are indeterminate. The average left  ventricular global longitudinal strain is -2.8 %. The global longitudinal  strain is  abnormal.  2. Right ventricular systolic function is normal. The right ventricular  size is normal. There is severely elevated pulmonary artery systolic  pressure. The estimated right ventricular systolic pressure is 73.4 mmHg.  3. The mitral valve is normal in structure. Mild to moderate mitral valve  regurgitation. No evidence of mitral stenosis.  4. The aortic valve is tricuspid. There is severe calcifcation of the  aortic valve. There is severe thickening of the aortic valve. Aortic valve  regurgitation is mild. No aortic stenosis is present. Aortic valve area,  by VTI measures 0.44 cm. Aortic  valve mean gradient measures 13.5 mmHg. Aortic valve Vmax measures 2.53  m/s. Dimensionless index is 0.15. The transaortic gradients are  underestimated due to severe LV dysfunction. This is consistent with  severe low flow, low gradient aortic stenosis.  5. The inferior vena cava is normal in size with <50% respiratory  variability, suggesting right atrial pressure of 8 mmHg.   Patient Profile     86 y.o. male with a hx of CAD (BMS to?RCA 2006 with residual 50-70% LAD), moderate aortic stenosis, HTN, HLD, lumbar stenosis, diverticulosis, hemorrhoids, tubular adenoma, possible CKD stage II who is being seen today for the evaluation of CHF   Assessment & Plan    Acute on on chronic diastolic heart failure Acute systolic heart failure with elevated RV pressure - EF this admission < 20% with global hypokinesis, elevated RV pressure, severe AS with mean gradient 13.5 mm Hg - suspect low flow AS - BNP > 3000 - diuresing on 40 mg IV lasix daily - is overall net positive, although I&Os appear inaccurate - low protein/albumin not helping fluid status - would continue diuresis as pressure allows; however, given rising creatinine will likely need to enlist help  from nephrology - low CO2 - needs to transfer [facility] with possible consult to AHF team   Conduction disease - first degree AVB, LBBB  and episodes of what appears to be second degree Mobtitz type 1 on telemetry - if TAVR pursued, may need PPM vs ICD given low EF   CAD S/P PCI to LAD/RCA (2012) - hs troponin 171 --> 153 --> 147 --> 148 - on ASA and plavix - no angina - Suspect elevated troponin is related to demand ischemia in the setting of CHF exacerbation, but known disease - given new CHF, known disease, and possible consult to structural heart team, would benefit from a right and left heart cath    Severe aortic valve stenosis - suspect low flow severe AS - possible TAVR candidate, will likely be high risk for surgical AVR   Acute on chronic renal insufficiency - presented sCr 2.06, now 2.23 - K 4.6 - consider nephrology consult given need for diuresis   Hypertension - hypotensive on arrival, has been marginal - hold home norvasc and benazepil   Elevated LFTs - AST/ALT 59 / 57 - defer to primary   Hyperlipidemia - will add on lipid profile - continue vytorin      For questions or updates, please contact [facility] Please consult [email or web address] for contact info under        Signed, Ledora Bottcher, PA  05/08/2020, 7:47 AM    I have seen and examined the patient along with Ledora Bottcher, PA .  I have reviewed the chart, notes and new data.  I agree with PA/NP's note.  Key new complaints: dyspnea with minimal activity, bit not overtly orthopneic Key examination changes: JVD 10 cm, muffled S2, AS murmur late-peaking, but fairly faint. Irregular rhythm with varying murmur intensity (2nd deg MT1 AV block on telemetry). Cheyne-Stokes breathing. Key new findings / data: some minimal progress on diuresis today, worsened renal parameters. 2nd deg MT1 AV block on telemetry with rate related changes in IVCD pattern.  PLAN: Hard to make any progress without inotrope therapy in an ICU environment. Not sure we will get him well enough to stand for TAVR evaluation. Would recommend discussing  a palliative care approach in parallel.  Sanda Klein, MD, [place] [phone] 05/08/2020, 5:42 PM

## 2020-05-09 ENCOUNTER — Encounter (HOSPITAL_COMMUNITY): Payer: Self-pay | Admitting: Internal Medicine

## 2020-05-09 DIAGNOSIS — Z7189 Other specified counseling: Secondary | ICD-10-CM

## 2020-05-09 DIAGNOSIS — I499 Cardiac arrhythmia, unspecified: Secondary | ICD-10-CM

## 2020-05-09 DIAGNOSIS — F4321 Adjustment disorder with depressed mood: Secondary | ICD-10-CM

## 2020-05-09 DIAGNOSIS — N179 Acute kidney failure, unspecified: Secondary | ICD-10-CM

## 2020-05-09 DIAGNOSIS — I5021 Acute systolic (congestive) heart failure: Secondary | ICD-10-CM

## 2020-05-09 DIAGNOSIS — I440 Atrioventricular block, first degree: Secondary | ICD-10-CM

## 2020-05-09 DIAGNOSIS — R778 Other specified abnormalities of plasma proteins: Secondary | ICD-10-CM | POA: Diagnosis not present

## 2020-05-09 DIAGNOSIS — R7989 Other specified abnormal findings of blood chemistry: Secondary | ICD-10-CM

## 2020-05-09 DIAGNOSIS — Z515 Encounter for palliative care: Secondary | ICD-10-CM

## 2020-05-09 LAB — COMPREHENSIVE METABOLIC PANEL
ALT: 50 U/L — ABNORMAL HIGH (ref 0–44)
ALT: 50 U/L — ABNORMAL HIGH (ref 0–44)
AST: 47 U/L — ABNORMAL HIGH (ref 15–41)
AST: 44 U/L — ABNORMAL HIGH (ref 15–41)
Albumin: 2.9 g/dL — ABNORMAL LOW (ref 3.5–5.0)
Albumin: 2.8 g/dL — ABNORMAL LOW (ref 3.5–5.0)
Alkaline Phosphatase: 181 U/L — ABNORMAL HIGH (ref 38–126)
Alkaline Phosphatase: 167 U/L — ABNORMAL HIGH (ref 38–126)
Anion gap: 16 — ABNORMAL HIGH (ref 5–15)
Anion gap: 14 (ref 5–15)
BUN: 103 mg/dL — ABNORMAL HIGH (ref 8–23)
BUN: 104 mg/dL — ABNORMAL HIGH (ref 8–23)
CO2: 21 mmol/L — ABNORMAL LOW (ref 22–32)
CO2: 22 mmol/L (ref 22–32)
Calcium: 8.9 mg/dL (ref 8.9–10.3)
Calcium: 8.7 mg/dL — ABNORMAL LOW (ref 8.9–10.3)
Chloride: 103 mmol/L (ref 98–111)
Chloride: 102 mmol/L (ref 98–111)
Creatinine, Ser: 2.53 mg/dL — ABNORMAL HIGH (ref 0.61–1.24)
Creatinine, Ser: 2.52 mg/dL — ABNORMAL HIGH (ref 0.61–1.24)
GFR calc Af Amer: 26 mL/min — ABNORMAL LOW (ref >60)
GFR calc Af Amer: 26 mL/min — ABNORMAL LOW (ref >60)
GFR calc non Af Amer: 22 mL/min — ABNORMAL LOW (ref >60)
GFR calc non Af Amer: 22 mL/min — ABNORMAL LOW (ref >60)
Glucose, Bld: 106 mg/dL — ABNORMAL HIGH (ref 70–99)
Glucose, Bld: 131 mg/dL — ABNORMAL HIGH (ref 70–99)
Potassium: 4.2 mmol/L (ref 3.5–5.1)
Potassium: 4 mmol/L (ref 3.5–5.1)
Sodium: 140 mmol/L (ref 135–145)
Sodium: 138 mmol/L (ref 135–145)
Total Bilirubin: 2.1 mg/dL — ABNORMAL HIGH (ref 0.3–1.2)
Total Bilirubin: 2.1 mg/dL — ABNORMAL HIGH (ref 0.3–1.2)
Total Protein: 5.6 g/dL — ABNORMAL LOW (ref 6.5–8.1)
Total Protein: 5.5 g/dL — ABNORMAL LOW (ref 6.5–8.1)

## 2020-05-09 LAB — CBC WITH DIFFERENTIAL/PLATELET
Abs Immature Granulocytes: 0.04 10*3/uL (ref 0.00–0.07)
Basophils Absolute: 0 10*3/uL (ref 0.0–0.1)
Basophils Relative: 0 %
Eosinophils Absolute: 0 10*3/uL (ref 0.0–0.5)
Eosinophils Relative: 0 %
HCT: 51.2 % (ref 39.0–52.0)
Hemoglobin: 16.3 g/dL (ref 13.0–17.0)
Immature Granulocytes: 0 %
Lymphocytes Relative: 8 %
Lymphs Abs: 0.7 10*3/uL (ref 0.7–4.0)
MCH: 28.3 pg (ref 26.0–34.0)
MCHC: 31.8 g/dL (ref 30.0–36.0)
MCV: 88.9 fL (ref 80.0–100.0)
Monocytes Absolute: 0.9 10*3/uL (ref 0.1–1.0)
Monocytes Relative: 10 %
Neutro Abs: 7.5 10*3/uL (ref 1.7–7.7)
Neutrophils Relative %: 82 %
Platelets: 268 10*3/uL (ref 150–400)
RBC: 5.76 MIL/uL (ref 4.22–5.81)
RDW: 16.3 % — ABNORMAL HIGH (ref 11.5–15.5)
WBC: 9.1 10*3/uL (ref 4.0–10.5)
nRBC: 0 % (ref 0.0–0.2)

## 2020-05-09 LAB — COOXEMETRY PANEL
Carboxyhemoglobin: 1.3 % (ref 0.5–1.5)
Carboxyhemoglobin: 1 % (ref 0.5–1.5)
Methemoglobin: 0.9 % (ref 0.0–1.5)
Methemoglobin: 0.6 % (ref 0.0–1.5)
O2 Saturation: 56.4 %
O2 Saturation: 58.1 %
Total hemoglobin: 17.1 g/dL — ABNORMAL HIGH (ref 12.0–16.0)
Total hemoglobin: 15.8 g/dL (ref 12.0–16.0)

## 2020-05-09 LAB — LACTIC ACID, PLASMA
Lactic Acid, Venous: 2.2 mmol/L (ref 0.5–1.9)
Lactic Acid, Venous: 2 mmol/L (ref 0.5–1.9)

## 2020-05-09 LAB — MRSA PCR SCREENING: MRSA by PCR: NEGATIVE

## 2020-05-09 LAB — MAGNESIUM: Magnesium: 2.7 mg/dL — ABNORMAL HIGH (ref 1.7–2.4)

## 2020-05-09 MED ORDER — MILRINONE LACTATE IN DEXTROSE 20-5 MG/100ML-% IV SOLN
0.3750 ug/kg/min | INTRAVENOUS | Status: DC
  Administered 2020-05-09 – 2020-05-10 (×2): 0.25 ug/kg/min via INTRAVENOUS
  Administered 2020-05-10 – 2020-05-16 (×10): 0.375 ug/kg/min via INTRAVENOUS
  Filled 2020-05-09 (×14): qty 100

## 2020-05-09 MED ORDER — EZETIMIBE-SIMVASTATIN 10-20 MG PO TABS
1.0000 | ORAL_TABLET | Freq: Every day | ORAL | Status: DC
  Filled 2020-05-09: qty 1

## 2020-05-09 MED ORDER — FUROSEMIDE 10 MG/ML IJ SOLN
80.0000 mg | Freq: Two times a day (BID) | INTRAMUSCULAR | Status: DC
  Administered 2020-05-09: 80 mg via INTRAVENOUS
  Filled 2020-05-09: qty 8

## 2020-05-09 MED ORDER — SIMVASTATIN 20 MG PO TABS
20.0000 mg | ORAL_TABLET | Freq: Every day | ORAL | Status: DC
  Administered 2020-05-09 – 2020-05-14 (×6): 20 mg via ORAL
  Filled 2020-05-09 (×7): qty 1

## 2020-05-09 MED ORDER — EZETIMIBE 10 MG PO TABS
10.0000 mg | ORAL_TABLET | Freq: Every day | ORAL | Status: DC
  Administered 2020-05-09 – 2020-05-15 (×7): 10 mg via ORAL
  Filled 2020-05-09 (×7): qty 1

## 2020-05-09 MED ORDER — HYDROCORTISONE ACETATE 25 MG RE SUPP
25.0000 mg | Freq: Two times a day (BID) | RECTAL | Status: DC | PRN
  Filled 2020-05-09: qty 1

## 2020-05-09 NOTE — Assessment & Plan Note (Addendum)
--  from death of wife recently in [place] --appears stable, will continue hydroxyzine nightly as needed for insomnia as per psychiatry

## 2020-05-09 NOTE — Progress Notes (Signed)
PROGRESS NOTE  [name] [name] NTZ:[id number] DOB: [birth year] DOA: 05/08/2020 PCP: Burnard Bunting, MD  Brief History   27yom w/ CAD, aortic stenosis, presented w/ SOB, hypotensive, found to LVEF <20% w/ severe AS. Transferred to [facility] to see AHF team and now on milrinone. Other issue is AKI.   A & P   Acute systolic CHF (congestive heart failure) (HCC) --as per cardiology --now on milrinone  Abnormal heart rhythm --as per cardiology --looks like [place]ke[place] on telemetry   Aortic stenosis, severe --per cardiology, TAVR being considered if clinical condition improves  AKI (acute kidney injury) ([place]) --vs CKD, no previous labs but concern for cardiorenal syndrome. --secondary to acute CHF --management per cardiology  Elevated LFTs --probably secondary to CHF --follow CMP QOD  COPD (chronic obstructive pulmonary disease) (HCC) --appears stable  Elevated troponin --probably secondary to acute CHF. Management per cardiology  Grief reaction --from death of wife recently in [place] --hydroxyzine nightly as needed for insomnia as per psychiatry  Disposition Plan:  Discussion: management as per cardiology   Status is: Inpatient  Remains inpatient appropriate because:IV treatments appropriate due to intensity of illness or inability to take PO and Inpatient level of care appropriate due to severity of illness   Dispo: The patient is from: Home              Anticipated d/c is to: TBD              Anticipated d/c date is: > 3 days              Patient currently is not medically stable to d/c.  DVT prophylaxis:  Place TED hose Start: 05/09/20 0920 enoxaparin (LOVENOX) injection 30 mg Start: 05/12/2020 1800 Code Status: DNR Family Communication:   Murray Hodgkins, MD  [facility] Direct contact: see [email or web address] (further directions at bottom of note if needed) 7PM-7AM contact night coverage as at bottom of note 05/09/2020, 6:16 PM  LOS: 3 days   Significant Hospital Events        Consults:      Procedures:     Significant Diagnostic Tests:      Micro Data:      Antimicrobials:     Interval History/Subjective  Feels ok, but reports hemorrhoid discomfort F/u heart problems  Objective   Vitals:  Vitals:   05/09/20 1400 05/09/20 1500  BP: (!) 115/91 113/89  Pulse: 83 88  Resp: (!) 23 20  Temp:    SpO2: 91% 94%    Exam:  Constitutional:    Appears calm and comfortable ENMT:   grossly normal hearing  Respiratory:   CTA bilaterally, no w/r/r.   Respiratory effort normal. Cardiovascular:   RRR, no m/r/g  No LE extremity edema   Psychiatric:   Mental status o Mood difficult to assess o Affect anxious  I have personally reviewed the following:   Today's Data   Creatinine 2.23 > 2.53  K+ 4.2  AST and ALT lower at 47 and 50 respectively  Tbili down to 2.3  Scheduled Meds:  aspirin EC  81 mg Oral Daily   Chlorhexidine Gluconate Cloth  6 each Topical Daily   enoxaparin (LOVENOX) injection  30 mg Subcutaneous Q24H   ezetimibe  10 mg Oral Daily   And   simvastatin  20 mg Oral q1800   mometasone-formoterol  2 puff Inhalation BID   omega-3 acid ethyl esters  1 g Oral Daily   sodium chloride flush  10-40 mL Intracatheter Q12H  sodium chloride flush  3 mL Intravenous Q12H   Continuous Infusions:  sodium chloride     milrinone 0.25 mcg/kg/min (05/09/20 1600)    Principal Problem:   Acute systolic CHF (congestive heart failure) (HCC) Active Problems:   Aortic stenosis, severe   AKI (acute kidney injury) (HCC)   Abnormal heart rhythm   HYPERTENSION, BENIGN   CAD, NATIVE VESSEL   Elevated troponin   Weakness   Fall   COPD (chronic obstructive pulmonary disease) (HCC)   Pressure injury of skin   Elevated LFTs   Goals of care, counseling/discussion   Palliative care by specialist   Grief reaction   LOS: 3 days   How to contact the [facility] Attending or Consulting provider [place] or covering  provider during after hours [place], for this patient?  1. Check the care team in [facility] and look for a) attending/consulting [facility] provider listed and b) the [facility] team listed 2. Log into [email or web address] and use [place]'s universal password to access. If you do not have the password, please contact the hospital operator. 3. Locate the [facility] provider you are looking for under [facility] and page to a number that you can be directly reached. 4. If you still have difficulty reaching the provider, please page the [facility] (Director on Call) for the Hospitalists listed on amion for assistance.

## 2020-05-09 NOTE — Hospital Course (Addendum)
83yom w/ CAD, aortic stenosis, presented w/ SOB, hypotensive, found to LVEF <20% w/ severe AS. Transferred to [facility] to see AHF team and remains on milrinone. Other issues: AKI, concern for cardiorenal, may have element of CKD. Now in ICU on vasopressor for cardiogenic shock. Continue management for acute cardiac issues as per cadiology. TAVR on hold.

## 2020-05-09 NOTE — Assessment & Plan Note (Addendum)
--  continue as per cardiology, TAVR considered but not stable enough to pursue

## 2020-05-09 NOTE — Assessment & Plan Note (Addendum)
--  continue as per cardiology including milrinone --guarded prognosis

## 2020-05-09 NOTE — Consult Note (Addendum)
Advanced Heart Failure Team Consult Note   Primary Physician: Burnard Bunting, MD PCP-Cardiologist:  Jenkins Rouge, MD  Reason for Consultation: Heart Failure   HPI:    [name] [name] is seen today for evaluation of heart failure at the request of Dr Sallyanne Kuster.   [name] [name] is an 86 year old with a history of CAD (BMS to?RCA 2006 with residual 50-70% LAD), moderate aortic stenosis, HTN, HLD, lumbar stenosis, diverticulosis, hemorrhoids, tubular adenoma, and CKD stage II . Had [facility] 2007 with no ischemia.   Presented to [facility] with increased shortness of breath. He has been under a lot of stress taking care of his wife and with family issues. His wife passed away the end of 04/17/2023.  He has been living alone. Has not been eating much. Not walking a lot due to fatigue and shortness of breath. He denies chest pain. Lives in an [place].   ED Course: Hypotensive on arrival and was given IV Fluids. CXR had no acute findings. BNP was >3800. HS Trop 171>153>147>148. ECHO this admit showed EF had fallen to< 20% with severe AS. Started on IV lasix. I/O not accurate. Had PICC placed.   Yesterday transferred to [facility] to follow and manage.   ECHO 2021 EF < 20% RV normal. ASThere is severe calcifcation of the aortic valve. There is severe thickening of the aortic valve. Aortic valve regurgitation is mild. No aortic stenosis is present.Aortic valve area, by VTI measures 0.44 cm. Aortic valve mean gradient measures 13.5 mm Hg. Aortic valve Vmax measures 2.53 m/s. Dimensionless index is 0.15. The transaorticgradients are underestimated due to severe LV dysfunction. This is consistent with severe low flow, low gradient aortic stenosis. ECHO 2019 EF 50-55% AV severely calcified.  Mean gradient 43 and Peak gradient 69 Echo 2018 EF 55-60%, mild LVH, grade 1 DD, moderate AS, mild AI/[name]/TR, mildly elevated PA pressure.   Review of Systems: [y] = yes, [ ]  = no   . General: Weight gain [ ] ;  Weight loss [ ] ; Anorexia [ ] ; Fatigue [ Y]; Fever [ ] ; Chills [ ] ; Weakness [ ]   . Cardiac: Chest pain/pressure [ ] ; Resting SOB [ ] ; Exertional SOB [Y ]; Orthopnea [ ] ; Pedal Edema [Y ]; Palpitations [ ] ; Syncope [ ] ; Presyncope [ ] ; Paroxysmal nocturnal dyspnea[ ]   . Pulmonary: Cough [ ] ; Wheezing[ ] ; Hemoptysis[ ] ; Sputum [ ] ; Snoring [ ]   . GI: Vomiting[ ] ; Dysphagia[ ] ; Melena[ ] ; Hematochezia [ ] ; Heartburn[ ] ; Abdominal pain [ ] ; Constipation [ ] ; Diarrhea [ ] ; BRBPR [ ]   . GU: Hematuria[ ] ; Dysuria [ ] ; Nocturia[ ]   . Vascular: Pain in legs with walking [ ] ; Pain in feet with lying flat [ ] ; Non-healing sores [ ] ; Stroke [ ] ; TIA [ ] ; Slurred speech [ ] ;  . Neuro: Headaches[ ] ; Vertigo[ ] ; Seizures[ ] ; Paresthesias[ ] ;Blurred vision [ ] ; Diplopia [ ] ; Vision changes [ ]   . Ortho/Skin: Arthritis [ ] ; Joint pain [Y ]; Muscle pain [ ] ; Joint swelling [ ] ; Back Pain [Y ]; Rash [ ]   . Psych: Depression[ ] ; Anxiety[ ]   . Heme: Bleeding problems [ ] ; Clotting disorders [ ] ; Anemia [ ]   . Endocrine: Diabetes [ ] ; Thyroid dysfunction[ ]   Home Medications Prior to Admission medications   Medication Sig Start Date End Date Taking? Authorizing Provider  albuterol (VENTOLIN HFA) 108 (90 Base) MCG/ACT inhaler Inhale 1-2 puffs into the lungs every 4 (four) hours  as needed for wheezing. 05/04/20  Yes [provider]  amLODipine (NORVASC) 5 MG tablet Take 5 mg by mouth daily. 02/09/20  Yes [provider]  aspirin 81 MG tablet Take 81 mg by mouth daily.    Yes [provider]  benazepril (LOTENSIN) 20 MG tablet Take 1 tablet (20 mg total) by mouth daily. 08/15/12  Yes Josue Hector, MD  doxycycline (MONODOX) 100 MG capsule Take 100 mg by mouth 2 (two) times daily. 05/04/20  Yes [provider]  ezetimibe-simvastatin (VYTORIN) 10-20 MG tablet Take 1 tablet by mouth at bedtime.   Yes [provider]  fish oil-omega-3 fatty acids 1000 MG capsule Take 1 g by mouth  daily.   Yes [provider]  hydrocortisone 2.5 % cream Apply 1 application topically 2 (two) times daily as needed (hemmorhoids).   Yes [provider]  nitroGLYCERIN (NITROSTAT) 0.4 MG SL tablet Place 1 tablet (0.4 mg total) under the tongue every 5 (five) minutes as needed. 09/06/15  Yes Isaiah Serge, NP  predniSONE (DELTASONE) 10 MG tablet Take 10 mg by mouth taper from 4 doses each day to 1 dose and stop. 05/04/20  Yes [provider]  sildenafil (VIAGRA) 100 MG tablet Take 100 mg by mouth daily as needed for erectile dysfunction.   Yes [provider]  SYMBICORT 160-4.5 MCG/ACT inhaler Inhale 2 puffs into the lungs in the morning and at bedtime. 05/04/20  Yes [provider]  triamcinolone cream (KENALOG) 0.1 % Apply 1 application topically 2 (two) times daily.   Yes [provider]    Past Medical History: Past Medical History:  Diagnosis Date  . CAD, NATIVE VESSEL    a. stenting of the RCA in 06/2005 with a bare-metal stent with residual 50-70% narrowing in the left anterior descending coronary artery. b. nonischemic nuc 2007.  . Diverticulosis   . Hemorrhoids   . History of colon polyps   . HYPERLIPIDEMIA-MIXED   . HYPERTENSION, BENIGN   . Lumbar stenosis   . Moderate aortic stenosis   . PROSTATE CANCER, HX OF   . Skin cancer    face, neck  . Tubular adenoma     Past Surgical History: Past Surgical History:  Procedure Laterality Date  . APPENDECTOMY    . CARDIAC CATHETERIZATION    . COLONOSCOPY  02-2014  . CORONARY STENT PLACEMENT     CAD post stenting of the RCA in 06/2005 with a bare-metal stent with residual 50-70% narrowing inb the left anterior descending coronary artery   . MOHS SURGERY     x 2  . PROSTATECTOMY      Family History: Family History  Problem Relation Age of Onset  . Heart disease Mother   . Hypertension Mother   . Colon cancer Neg Hx   . Esophageal cancer Neg Hx   . Stomach cancer Neg Hx    . Rectal cancer Neg Hx   . Heart attack Neg Hx   . Stroke Neg Hx     Social History: Social History   Socioeconomic History  . Marital status: Widowed    Spouse name: Not on file  . Number of children: 2  . Years of education: Not on file  . Highest education level: Not on file  Occupational History  . Occupation: retired Secondary school teacher  Tobacco Use  . Smoking status: Never Smoker  . Smokeless tobacco: Never Used  Vaping Use  . Vaping Use: Never used  Substance  and Sexual Activity  . Alcohol use: No  . Drug use: No  . Sexual activity: Not on file  Other Topics Concern  . Not on file  Social History Narrative  . Not on file   Social Determinants of Health   Financial Resource Strain:   . Difficulty of Paying Living Expenses: Not on file  Food Insecurity:   . Worried About Charity fundraiser in the Last Year: Not on file  . Ran Out of Food in the Last Year: Not on file  Transportation Needs:   . Lack of Transportation (Medical): Not on file  . Lack of Transportation (Non-Medical): Not on file  Physical Activity:   . Days of Exercise per Week: Not on file  . Minutes of Exercise per Session: Not on file  Stress:   . Feeling of Stress : Not on file  Social Connections:   . Frequency of Communication with Friends and Family: Not on file  . Frequency of Social Gatherings with Friends and Family: Not on file  . Attends Religious Services: Not on file  . Active Member of Clubs or Organizations: Not on file  . Attends Archivist Meetings: Not on file  . Marital Status: Not on file    Allergies:  No Known Allergies  Objective:    Vital Signs:   Temp:  [97 F (36.1 C)-97.6 F (36.4 C)] 97.3 F (36.3 C) (09/16 0803) Pulse Rate:  [71-95] 71 (09/16 0803) Resp:  [14-23] 23 (09/16 0803) BP: (102-123)/(79-95) 121/95 (09/16 0803) SpO2:  [93 %-97 %] 96 % (09/16 0803) Weight:  [76.5 kg] 76.5 kg (09/15 1842) Last BM Date: 05/07/20  Weight change: Filed  Weights   05/15/2020 2049 05/08/20 0503 05/08/20 1842  Weight: 77.3 kg 76.4 kg 76.5 kg    Intake/Output:   Intake/Output Summary (Last 24 hours) at 05/09/2020 0836 Last data filed at 05/09/2020 0800 Gross per 24 hour  Intake 520 ml  Output 601 ml  Net -81 ml      Physical Exam    General:In the chair. No resp difficulty HEENT: normal Neck: supple. JVP to jaw . Carotids 2+ bilat; no bruits. No lymphadenopathy or thyromegaly appreciated. Cor: PMI nondisplaced. Regular rate & rhythm. No rubs, gallops. 2/6 RUSB . Lungs: Crackles on 2 liters  Abdomen: soft, nontender, nondistended. No hepatosplenomegaly. No bruits or masses. Good bowel sounds. Extremities: warm, no cyanosis, clubbing, rash, R and LLE 2+ edema Neuro: alert & orientedx3, cranial nerves grossly intact. moves all 4 extremities w/o difficulty. Affect pleasant   Telemetry  Hard to tell. SR with frequent PVCs versus A fib    EKG    SR with long PR interval.   Labs   Basic Metabolic Panel: Recent Labs  Lab 05/05/20 1948 05/05/20 1948 05/10/2020 1101 05/07/20 0506 05/08/20 0502  NA 137  --  138 134* 136  K 5.4*  --  4.8 4.2 4.6  CL 104  --  106 103 102  CO2 18*  --  16* 15* 17*  GLUCOSE 116*  --  120* 87 112*  BUN 77*  --  84* 80* 94*  CREATININE 2.42*  --  2.07* 2.06* 2.23*  CALCIUM 9.3   < > 8.6* 8.5* 8.7*  MG  --   --   --   --  2.7*   < > = values in this interval not displayed.    Liver Function Tests: Recent Labs  Lab 05/12/2020 1101 05/08/20 0502  AST  50* 59*  ALT 52* 57*  ALKPHOS 155* 185*  BILITOT 2.1* 2.3*  PROT 6.1* 6.0*  ALBUMIN 3.2* 3.1*   No results for input(s): LIPASE, AMYLASE in the last 168 hours. No results for input(s): AMMONIA in the last 168 hours.  CBC: Recent Labs  Lab 05/05/20 1948 05/09/2020 1101 05/07/20 0506 05/08/20 0502  WBC 8.4 9.4 8.7 8.7  NEUTROABS  --   --   --  7.1  HGB 16.1 14.6 15.9 17.0  HCT 51.0 44.6 49.9 53.5*  MCV 90.4 90.1 90.7 90.7  PLT 262 229  245 264    Cardiac Enzymes: No results for input(s): CKTOTAL, CKMB, CKMBINDEX, TROPONINI in the last 168 hours.  BNP: BNP (last 3 results) Recent Labs    04/29/2020 1101  BNP 3,817.0*    ProBNP (last 3 results) No results for input(s): PROBNP in the last 8760 hours.   CBG: No results for input(s): GLUCAP in the last 168 hours.  Coagulation Studies: No results for input(s): LABPROT, INR in the last 72 hours.   Imaging   US RENAL  Result Date: 05/08/2020 CLINICAL DATA:  Acute renal insufficiency EXAM: RENAL / URINARY TRACT ULTRASOUND COMPLETE COMPARISON:  None. FINDINGS: Right Kidney: Renal measurements: 9.7 x 5.0 x 5.1 cm = volume: 128 mL. Echogenicity within normal limits. No mass or hydronephrosis visualized. A 3.6 cm simple cyst is seen within the lower pole. Left Kidney: Renal measurements: 10.8 x 4.6 x 3.8 cm = volume: 100 mL. Echogenicity within normal limits. No mass or hydronephrosis visualized. Bladder: Appears normal for degree of bladder distention. Other: A small left pleural effusion is incidentally noted IMPRESSION: Normal renal sonogram.  No hydronephrosis. Small left pleural effusion Electronically Signed   By: Fidela Salisbury MD   On: 05/08/2020 18:52   DG CHEST PORT 1 VIEW  Result Date: 05/08/2020 CLINICAL DATA:  Shortness of breath. EXAM: PORTABLE CHEST 1 VIEW COMPARISON:  May 06, 2020. FINDINGS: Stable cardiomegaly. No pneumothorax or pleural effusion is noted. Left lung is clear. Right-sided PICC line is noted with distal tip in expected position and of SVC. Right perihilar infiltrate or atelectasis is noted. Bony thorax is unremarkable. IMPRESSION: Right-sided PICC line in grossly good position. Right perihilar infiltrate or atelectasis is noted. Aortic Atherosclerosis (ICD10-I70.0). Electronically Signed   By: Marijo Conception M.D.   On: 05/08/2020 14:56   [place] EKG SITE RITE  Result Date: 05/08/2020 If Site Rite image not attached, placement could not be  confirmed due to current cardiac rhythm.     Medications:     Current Medications: . aspirin EC  81 mg Oral Daily  . Chlorhexidine Gluconate Cloth  6 each Topical Daily  . enoxaparin (LOVENOX) injection  30 mg Subcutaneous Q24H  . furosemide  40 mg Intravenous Daily  . mometasone-formoterol  2 puff Inhalation BID  . omega-3 acid ethyl esters  1 g Oral Daily  . sodium chloride flush  10-40 mL Intracatheter Q12H  . sodium chloride flush  3 mL Intravenous Q12H     Infusions: . sodium chloride         Patient Profile   [name] [name] is an 86 year old with a history of CAD (BMS to?RCA 2006 with residual 50-70% LAD), moderate aortic stenosis, HTN, HLD, lumbar stenosis, diverticulosis, hemorrhoids, tubular adenoma, and CKD stage II .   Admitted with Acute Systolic HF.   Assessment/Plan    1. Acute Systolic HF On ECHO EF down from previous preserved EF to <  20% with severe AS.  -Would benefit from cath but not having chest pain and creatinine is > 1.5.  - Set up CMRI once diuresed.  -Volume status elevated. Start 80 IV lasix twice daily. Add ted hose.  -Set up CVP and CO-OX.  - No bb/arni/spiro for now.   2. CAD -Had PCI 2006 to LAD/RCA -HS Trop 171>153>147>141  -On asa.   3. Aortic Stenosis, severe on ECHO  - Will need TEE once stabilized and diuresed.  -  Will follow up with structural heart team.   4. AKI 2.4 on admit->2.1>2.1>2.23  - Follow renal function closely.  - Unclear baseline. I requested lab work from Dr Darrow Bussing office.   5. Hyperlipidemia On vytorin at home.  Restart   6. PVCs  Frequent PVCs on monitor.  7. LBBB    Obtain EKG now. Labs pending.   Length of Stay: 3  Amy Clegg, NP  05/09/2020, 8:36 AM  Advanced Heart Failure Team Pager [phone] (M-F; 7a - 4p)  Please contact [facility] for night-coverage after hours (4p -7a ) and weekends on [email or web address]  Patient seen with NP, agree with the above note.    As above, admitted initially to  [facility] with worsening dyspnea.  Has been under a lot of stress at home.  He was noted to have elevated creatinine and CHF.  Echo showed EF < 20% with low flow/low gradient severe AS (echo reviewed today, agree with severe AS).   Co-ox 56% today, BUN up to 103 with creatinine 2.53.  MAP stable.  He feels weak but not short of breath at rest.   General: NAD Neck: JVP 14-16 cm, no thyromegaly or thyroid nodule.  Lungs: Mild crackles at bases.  CV: Nondisplaced PMI.  Heart irregular S1/S2, no S3/S4, 2/6 SEM RUSB.  1+ edema to knees.  No carotid bruit.  Difficult to palpate pedal pulses.  Abdomen: Soft, nontender, no hepatosplenomegaly, no distention.  Skin: Intact without lesions or rashes.  Neurologic: Alert and oriented x 3.  Psych: Normal affect. Extremities: No clubbing or cyanosis.  HEENT: Normal.   1. Acute on chronic systolic CHF: Echo this admission with new EF < 20%, normal RV, suspect low flow/low gradient severe AS. Prior echo in 2019 with severe AS, EF 50-55%. No chest pain, just dyspnea.  I am concerned that this may be the natural history over the last 2 years of severe AS.  It appears that he was lost to followup after 2019 echo. He has a history of CAD, so cannot rule out a component of ischemic cardiomyopathy.  He did not present with ACS.  On exam, he is volume overloaded with elevated creatinine consistent with cardiorenal syndrome.  Co-ox 56%, not markedly low but abnormal.  - Start milrinone 0.25 mcg/kg/min.  Follow co-ox and CVP off PICC.  - Will attempt diuresis with Lasix 80 mg IV bid.  - Will need RHC/LHC if his creatinine improves.  - Ultimately, would have TAVR.  However, I am concerned that we may not be able to get him there.  - Cardiac amyloidosis is certainly a concern with AS, conduction system disease.  Eventual cMRI if he stabilizes.  2. Aortic stenosis: Low flow/low gradient severe AS.  In 2019, echo showed severe AS.  As above, I am concerned that EF is low  primarily due to untreated AS.  His only option at this point would be TAVR, but he will need considerable improvement prior and it is not clear that  he will make it to TAVR.  3. AKI: Creatinine up to 2.53 with BUN 103.  He is volume overloaded on exam, co-ox marginal at 56%.  BP stable.  Suspect cardiorenal syndrome.  - Adding milrinone as above.  4. CAD: PCI in 2006.  Presentation not consistent with ACS, no chest pain.  HS-TnI mildly elevated with no trend.   - Continue ASA, statin.  - Ideally would have cath but need improvement in creatinine.  5. Rhythm: Markedly abnormal.  Baseline NSR with long 1st degree AVB.  He appears to have alternating bundle branch blocks.  He also appears to have Wenckebach periodicity at times.  Significant conduction system disease, if he is able to make it to TAVR, I suspect he will need a pacemaker.   Loralie Champagne 05/09/2020 12:34 PM

## 2020-05-09 NOTE — Progress Notes (Signed)
PT Cancellation Note  Patient Details Name: [name] [name] MRN: [id number] DOB: May 14, 1932   Cancelled Treatment:    Reason Eval/Treat Not Completed: Fatigue/lethargy limiting ability to participate. Pt just returned to bed from sitting in recliner this morning. Pt reporting he is too fatigued to participate in therapy. He appears very tired, SOB noted at rest in bed on 3L. SpO2 in 90s. Pt transferred to [facility] from [facility] yesterday. PT to re-attempt as time allows.    Lorriane Shire 05/09/2020, 11:17 AM   Lorrin Goodell, PT  Office # [phone] Pager [phone]

## 2020-05-09 NOTE — Progress Notes (Signed)
    CO-OX 58% . Started on milrinone and given 80 mg IV lasix.   CVP now 7-8. Will stop IV lasix and continue milrinone. Hopefully renal function will improve.   Discussed with Dr Aundra Dubin.   Sedalia Greeson NP-C

## 2020-05-09 NOTE — Consult Note (Signed)
Consultation Note Date: 05/09/2020   Patient Name: [name] [name]  DOB: 07/02/1932  MRN: [id number]  Age / Sex: 86 y.o., male  PCP: [name] Bunting, MD Referring Physician: Samuella Cota, MD  Reason for Consultation: Establishing goals of care and Psychosocial/spiritual support  HPI/Patient Profile: 86 y.o. male  with past medical history of severe aortic stenosis, CAD-native vessel stenting of RCA in 2006, diverticulosis, lumbar spinal stenosis,, HTN/HLD, colonoscopy 2015, prostatectomy, Mohs surgery x2 admitted on 05/22/2020 with acute systolic heart failure, EF down from previous EF to less than 20% with severe aortic stenosis.  Plans once clinically optimized for cardiac MRI, TEE, and structural heart team to consider TAVR.  Clinical Assessment and Goals of Care: I have reviewed medical records including EPIC notes, labs and imaging, received report from bedside nursing staff, examined the patient and met at bedside with patient to discuss diagnosis prognosis, GOC, EOL wishes, disposition and options.  [name] [name] is lying quietly in bed with his eyes closed.  He appears acutely/chronically ill and somewhat frail.  He is alert and oriented x3, able to make his basic needs known.  There is no family at bedside at this time.  Initially during our conversation [name] [name] keeps his eyes closed but, by the end of our conversation he is making and mostly keeping eye contact.  I introduced Palliative Medicine as specialized medical care for people living with serious illness. It focuses on providing relief from the symptoms and stress of a serious illness.   We discussed a brief life review of the patient.  [name] [name] shares that his wife of 7 years passed away at the end of 04-20-20 after a stroke.  He shares that he would take her meals to the nursing home twice a day.  He shares that he has a cousin TM who helps him  as needed around the house, and his Sister [name] [name] is there to help him as needed, too.  As far as functional and nutritional status, [name] [name] had been independent with ADLs/IADLs prior to this illness.  He tells me that he was living independently, and would take meals to his nursing home bound wife twice a day.  We discussed current illness and what it means in the larger context of on-going co-morbidities.  Natural disease trajectory and expectations at EOL were discussed.  We talked about the treatment plan in detail including, but not limited to, cardiology consult, selected labs.  We talked about time for outcomes, evaluation for future treatment plan.  Questions and concerns were addressed.  PMT to continue to follow  Conference with attending, bedside nursing staff, transition of care team related to patient condition, needs, goals of care.   HCPOA    HCPOA -[name] [name] names his sister, [name] [name] as his healthcare surrogate.  His wife died 2020-04-20.  He has adult children, but he is not close with them.    SUMMARY OF RECOMMENDATIONS   At this point continue to treat the treatable but no CPR  or intubation Time for outcomes. Considering/being evaluated for TAVR due to severe aortic stenosis   Code Status/Advance Care Planning:  DNR  Symptom Management:   Per hospitalist, no additional needs at this time.   Palliative Prophylaxis:   Frequent Pain Assessment, Oral Care and Turn Reposition  Additional Recommendations (Limitations, Scope, Preferences):  treat the treatable, but no CPR or intubation   Psycho-social/Spiritual:   Desire for further Chaplaincy support:no  Additional Recommendations: Caregiving  Support/Resources and Education on Hospice  Prognosis:   Unable to determine, based on outcomes, TAVR  Discharge Planning: To be determined, based on outcomes.  Anticipate need for short-term rehab, needs PT eval.      Primary Diagnoses: Present on  Admission: . Elevated troponin . CAD, NATIVE VESSEL . HYPERTENSION, BENIGN . Fall . COPD (chronic obstructive pulmonary disease) ([place])   I have reviewed the medical record, interviewed the patient and family, and examined the patient. The following aspects are pertinent.  Past Medical History:  Diagnosis Date  . CAD, NATIVE VESSEL    a. stenting of the RCA in 06/2005 with a bare-metal stent with residual 50-70% narrowing in the left anterior descending coronary artery. b. nonischemic nuc 2007.  . Diverticulosis   . Hemorrhoids   . History of colon polyps   . HYPERLIPIDEMIA-MIXED   . HYPERTENSION, BENIGN   . Lumbar stenosis   . Moderate aortic stenosis   . PROSTATE CANCER, HX OF   . Skin cancer    face, neck  . Tubular adenoma    Social History   Socioeconomic History  . Marital status: Widowed    Spouse name: Not on file  . Number of children: 2  . Years of education: Not on file  . Highest education level: Not on file  Occupational History  . Occupation: retired Secondary school teacher  Tobacco Use  . Smoking status: Never Smoker  . Smokeless tobacco: Never Used  Vaping Use  . Vaping Use: Never used  Substance and Sexual Activity  . Alcohol use: No  . Drug use: No  . Sexual activity: Not on file  Other Topics Concern  . Not on file  Social History Narrative  . Not on file   Social Determinants of Health   Financial Resource Strain:   . Difficulty of Paying Living Expenses: Not on file  Food Insecurity:   . Worried About Charity fundraiser in the Last Year: Not on file  . Ran Out of Food in the Last Year: Not on file  Transportation Needs:   . Lack of Transportation (Medical): Not on file  . Lack of Transportation (Non-Medical): Not on file  Physical Activity:   . Days of Exercise per Week: Not on file  . Minutes of Exercise per Session: Not on file  Stress:   . Feeling of Stress : Not on file  Social Connections:   . Frequency of Communication with Friends  and Family: Not on file  . Frequency of Social Gatherings with Friends and Family: Not on file  . Attends Religious Services: Not on file  . Active Member of Clubs or Organizations: Not on file  . Attends Archivist Meetings: Not on file  . Marital Status: Not on file   Family History  Problem Relation Age of Onset  . Heart disease Mother   . Hypertension Mother   . Colon cancer Neg Hx   . Esophageal cancer Neg Hx   . Stomach cancer Neg Hx   .  Rectal cancer Neg Hx   . Heart attack Neg Hx   . Stroke Neg Hx    Scheduled Meds: . aspirin EC  81 mg Oral Daily  . Chlorhexidine Gluconate Cloth  6 each Topical Daily  . enoxaparin (LOVENOX) injection  30 mg Subcutaneous Q24H  . ezetimibe  10 mg Oral Daily   And  . simvastatin  20 mg Oral q1800  . furosemide  80 mg Intravenous BID  . mometasone-formoterol  2 puff Inhalation BID  . omega-3 acid ethyl esters  1 g Oral Daily  . sodium chloride flush  10-40 mL Intracatheter Q12H  . sodium chloride flush  3 mL Intravenous Q12H   Continuous Infusions: . sodium chloride    . milrinone 0.25 mcg/kg/min (05/09/20 1237)   PRN Meds:.sodium chloride, acetaminophen **OR** acetaminophen, hydrOXYzine, ipratropium-albuterol, nitroGLYCERIN, ondansetron **OR** ondansetron (ZOFRAN) IV, sodium chloride flush, sodium chloride flush, witch hazel-glycerin Medications Prior to Admission:  Prior to Admission medications   Medication Sig Start Date End Date Taking? Authorizing Provider  albuterol (VENTOLIN HFA) 108 (90 Base) MCG/ACT inhaler Inhale 1-2 puffs into the lungs every 4 (four) hours as needed for wheezing. 05/04/20  Yes [provider]  amLODipine (NORVASC) 5 MG tablet Take 5 mg by mouth daily. 02/09/20  Yes [provider]  aspirin 81 MG tablet Take 81 mg by mouth daily.    Yes [provider]  benazepril (LOTENSIN) 20 MG tablet Take 1 tablet (20 mg total) by mouth daily. 08/15/12  Yes Josue Hector, MD   doxycycline (MONODOX) 100 MG capsule Take 100 mg by mouth 2 (two) times daily. 05/04/20  Yes [provider]  ezetimibe-simvastatin (VYTORIN) 10-20 MG tablet Take 1 tablet by mouth at bedtime.   Yes [provider]  fish oil-omega-3 fatty acids 1000 MG capsule Take 1 g by mouth daily.   Yes [provider]  hydrocortisone 2.5 % cream Apply 1 application topically 2 (two) times daily as needed (hemmorhoids).   Yes [provider]  nitroGLYCERIN (NITROSTAT) 0.4 MG SL tablet Place 1 tablet (0.4 mg total) under the tongue every 5 (five) minutes as needed. 09/06/15  Yes Isaiah Serge, NP  predniSONE (DELTASONE) 10 MG tablet Take 10 mg by mouth taper from 4 doses each day to 1 dose and stop. 05/04/20  Yes [provider]  sildenafil (VIAGRA) 100 MG tablet Take 100 mg by mouth daily as needed for erectile dysfunction.   Yes [provider]  SYMBICORT 160-4.5 MCG/ACT inhaler Inhale 2 puffs into the lungs in the morning and at bedtime. 05/04/20  Yes [provider]  triamcinolone cream (KENALOG) 0.1 % Apply 1 application topically 2 (two) times daily.   Yes [provider]   No Known Allergies Review of Systems  Unable to perform ROS: Age    Physical Exam Vitals and nursing note reviewed.  Constitutional:      Appearance: He is normal weight. He is ill-appearing.  Cardiovascular:     Rate and Rhythm: Normal rate.  Pulmonary:     Effort: Pulmonary effort is normal. No respiratory distress.  Abdominal:     General: Abdomen is flat.  Skin:    General: Skin is warm and dry.  Neurological:     Mental Status: He is alert and oriented to person, place, and time.  Psychiatric:        Mood and Affect: Mood normal.        Behavior: Behavior normal.  Vital Signs: BP 105/86 (BP Location: Left Arm)   Pulse 73   Temp (!) 97.2 F (36.2 C) (Oral)   Resp (!) 0   Ht 5' 10" (1.778 m)   Wt 76.5 kg   SpO2 93%   BMI 24.20 kg/m   Pain Scale: 0-10   Pain Score: 0-No pain   SpO2: SpO2: 93 % O2 Device:SpO2: 93 % O2 Flow Rate: .O2 Flow Rate (L/min): 3 L/min  IO: Intake/output summary:   Intake/Output Summary (Last 24 hours) at 05/09/2020 1255 Last data filed at 05/09/2020 0800 Gross per 24 hour  Intake 520 ml  Output 601 ml  Net -81 ml    LBM: Last BM Date: 05/07/20 Baseline Weight: Weight: 79.4 kg Most recent weight: Weight: 76.5 kg     Palliative Assessment/Data:   Flowsheet Rows     Most Recent Value  Intake Tab  Referral Department Hospitalist  Unit at Time of Referral Other (Comment)  Palliative Care Primary Diagnosis Cardiac  Date Notified 05/08/20  Palliative Care Type New Palliative care  Reason for referral Clarify Goals of Care  Date of Admission 05/17/2020  Date first seen by Palliative Care 05/09/20  # of days Palliative referral response time 1 Day(s)  # of days IP prior to Palliative referral 2  Clinical Assessment  Palliative Performance Scale Score 50%  Pain Max last 24 hours Not able to report  Pain Min Last 24 hours Not able to report  Dyspnea Max Last 24 Hours Not able to report  Dyspnea Min Last 24 hours Not able to report  Psychosocial & Spiritual Assessment  Palliative Care Outcomes      Time In: 1040 Time Out: 1130 Time Total: 50 minutes Greater than 50%  of this time was spent counseling and coordinating care related to the above assessment and plan.  Signed by: Drue Novel, NP   Please contact Palliative Medicine Team phone at [phone] for questions and concerns.  For individual provider: See Shea Evans

## 2020-05-09 NOTE — Assessment & Plan Note (Signed)
--  probably secondary to acute CHF. Management per cardiology

## 2020-05-09 NOTE — Assessment & Plan Note (Addendum)
--  stable, continue nebs and Dulera

## 2020-05-09 NOTE — Assessment & Plan Note (Addendum)
--  secondary to CHF congestive hepatopathy --transaminases have normalized --Tbili stable just over 2 --monitor

## 2020-05-09 NOTE — Consult Note (Addendum)
Cardiology Consultation:   Patient ID: [name] MRN: [id number]; DOB: 05-27-1932  Admit date: 05/17/2020 Date of Consult: 05/09/2020  Primary Care Provider: Burnard Bunting, MD [facility] Cardiologist: Jenkins Rouge, MD  [place] Electrophysiologist:  None    Patient Profile:   [name] [name] is a 86 y.o. male with a hx of CAD (BMS to ?RCA 2006 with residual 50-70% LAD), HTN, HLD, VHD w/AS, chronic back pain/spinal stenosis, CKD (II), LBBB  who is being seen today for the evaluation of abnormal EKG, concerns of AVblock, and wide complex rhythgm at the request of Dr. Aundra Dubin.  History of Present Illness:   Mr. Hellmer was last seen by cardiology in the OP setting appears back in 2019, at that time for the purposes of clearance for a colonoscopy.  He was admitted to [facility] 04/24/2020 with progressive weakness, functional decline it seems particularly after the death of his wife with poor oral intake.   With progressive SOB, swelling LE called EMS.  chart review notes: EMS arrival BP was found to be soft at 98/72. He received a 250 NS bolus. Creatinine was 2.07, AST/ALT was 50/52. BNP found to be markedly elevated at 3817. hsT was 171>>153>>147>>148. CXR with no acute cardiopulmonary findings. EKG with NSR.  Admitted with acute on chronic CHF, work up has revealed EF < 20% RV normal. ASThere is severe calcifcation of the aortic valve. There is severe thickening of the aortic valve. Aortic valve regurgitation is mild. No aortic stenosis is present.Aortic valve area, by VTI measures 0.44 cm. Aortic valve mean gradient measures 13.5 mm Hg. Aortic valve Vmax measures 2.53 m/s. Dimensionless index is 0.15. The transaorticgradients are underestimated due to severe LV dysfunction. This is consistent with severe low flow, low gradient aortic stenosis.    He was transferred to [facility] for further w/u and management, low output failure AHF added to care today further diuresis planned, with pending  picc , and in d/w team, planned for milrinone. IN review of his telemetry seems to have recurrent bursts of faster HR with his baseline BBB/axis, PVCs, and suspect Mobitz I. EP is asked to the case for this.  There are plans once clinically optimized for c.MRI, TEE and structural heart team to consider perhaps TAVR  LABS K+ 4.2 BUN/Creat 390/3.00 (uncertain baseline) AST 47 ALT 50 WBC 9.1 H/H 16/51 Plts 268  He is feeling less SOB, denies any kind of CP, no dizzy spells, near syncope or syncope here or at home Tends to reminisce about his wife and the last couple years of he/his wife's life together and familial stressors.   Past Medical History:  Diagnosis Date  . CAD, NATIVE VESSEL    a. stenting of the RCA in 06/2005 with a bare-metal stent with residual 50-70% narrowing in the left anterior descending coronary artery. b. nonischemic nuc 2007.  . Diverticulosis   . Hemorrhoids   . History of colon polyps   . HYPERLIPIDEMIA-MIXED   . HYPERTENSION, BENIGN   . Lumbar stenosis   . Moderate aortic stenosis   . PROSTATE CANCER, HX OF   . Skin cancer    face, neck  . Tubular adenoma     Past Surgical History:  Procedure Laterality Date  . APPENDECTOMY    . CARDIAC CATHETERIZATION    . COLONOSCOPY  02-2014  . CORONARY STENT PLACEMENT     CAD post stenting of the RCA in 06/2005 with a bare-metal stent with residual 50-70% narrowing inb the left anterior  descending coronary artery   . MOHS SURGERY     x 2  . PROSTATECTOMY         Inpatient Medications: Scheduled Meds: . aspirin EC  81 mg Oral Daily  . Chlorhexidine Gluconate Cloth  6 each Topical Daily  . enoxaparin (LOVENOX) injection  30 mg Subcutaneous Q24H  . ezetimibe-simvastatin  1 tablet Oral q1800  . furosemide  80 mg Intravenous BID  . mometasone-formoterol  2 puff Inhalation BID  . omega-3 acid ethyl esters  1 g Oral Daily  . sodium chloride flush  10-40 mL Intracatheter Q12H  . sodium chloride flush  3  mL Intravenous Q12H   Continuous Infusions: . sodium chloride    . milrinone     PRN Meds: sodium chloride, acetaminophen **OR** acetaminophen, hydrOXYzine, ipratropium-albuterol, nitroGLYCERIN, ondansetron **OR** ondansetron (ZOFRAN) IV, sodium chloride flush, sodium chloride flush, witch hazel-glycerin  Allergies:   No Known Allergies  Social History:   Social History   Socioeconomic History  . Marital status: Widowed    Spouse name: Not on file  . Number of children: 2  . Years of education: Not on file  . Highest education level: Not on file  Occupational History  . Occupation: retired Secondary school teacher  Tobacco Use  . Smoking status: Never Smoker  . Smokeless tobacco: Never Used  Vaping Use  . Vaping Use: Never used  Substance and Sexual Activity  . Alcohol use: No  . Drug use: No  . Sexual activity: Not on file  Other Topics Concern  . Not on file  Social History Narrative  . Not on file   Social Determinants of Health   Financial Resource Strain:   . Difficulty of Paying Living Expenses: Not on file  Food Insecurity:   . Worried About Charity fundraiser in the Last Year: Not on file  . Ran Out of Food in the Last Year: Not on file  Transportation Needs:   . Lack of Transportation (Medical): Not on file  . Lack of Transportation (Non-Medical): Not on file  Physical Activity:   . Days of Exercise per Week: Not on file  . Minutes of Exercise per Session: Not on file  Stress:   . Feeling of Stress : Not on file  Social Connections:   . Frequency of Communication with Friends and Family: Not on file  . Frequency of Social Gatherings with Friends and Family: Not on file  . Attends Religious Services: Not on file  . Active Member of Clubs or Organizations: Not on file  . Attends Archivist Meetings: Not on file  . Marital Status: Not on file  Intimate Partner Violence:   . Fear of Current or Ex-Partner: Not on file  . Emotionally Abused: Not on file   . Physically Abused: Not on file  . Sexually Abused: Not on file    Family History:   Family History  Problem Relation Age of Onset  . Heart disease Mother   . Hypertension Mother   . Colon cancer Neg Hx   . Esophageal cancer Neg Hx   . Stomach cancer Neg Hx   . Rectal cancer Neg Hx   . Heart attack Neg Hx   . Stroke Neg Hx      ROS:  Please see the history of present illness.  All other ROS reviewed and negative.     Physical Exam/Data:   Vitals:   05/09/20 0737 05/09/20 0443 05/09/20 0803 05/09/20 0936  BP:  102/83  (!) 121/95   Pulse: 71  71 80  Resp: 19 18 (!) 23 (!) 21  Temp: (!) 97 F (36.1 C)  (!) 97.3 F (36.3 C)   TempSrc: Axillary  Oral   SpO2: 94% 96% 96% 94%  Weight:      Height:        Intake/Output Summary (Last 24 hours) at 05/09/2020 1052 Last data filed at 05/09/2020 0800 Gross per 24 hour  Intake 520 ml  Output 601 ml  Net -81 ml   Last 3 Weights 05/08/2020 05/08/2020 05/04/2020  Weight (lbs) 168 lb 10.4 oz 168 lb 6.9 oz 170 lb 6.7 oz  Weight (kg) 76.5 kg 76.4 kg 77.3 kg     Body mass index is 24.2 kg/m.  General:  Well nourished, well developed, in no acute distress, sleeping comfortably and easily woken HEENT: normal Lymph: no adenopathy Neck: no JVD Endocrine:  No thryomegaly Vascular: No carotid bruits  Cardiac:  RRR; 2/6 SM Lungs:  B/l crackles at bases, no wheezing, rhonchi or rales  Abd: soft, nontender Ext: 1+ edema Musculoskeletal:  No deformities, age appropriate atrophy Skin: warm and dry  Neuro:  no focal abnormalities noted Psych:  Normal affect   EKG:  The EKG was personally reviewed and demonstrates:    04/24/2020: SR 86bpm, 1st degree Avblock, PR 392ms, IVCD, QRS 118ms, IVCD Today is SR 78bpm, 1st degree AVblock with what appears variable PR, suggestion of Mobitz I, and seems to alternate BBB morphology  OLD 04/10/18 is SB 55bpm, 1st degree AVblock,, RBBB   Telemetry:  Telemetry was personally reviewed and  demonstrates:   Rates are good, 60's-90's I do not appreciate anything that looks like VT Appears to have varial AV blockm clearly Mobitz I at times I do not think he has mored advanced block then that Some strips look perhaps block PACs, not clearly 2:1 Avblock He has what looks like alternating BBB  Relevant CV Studies:  05/07/2020: TTE IMPRESSIONS  1. Left ventricular ejection fraction, by estimation, is <20%. The left  ventricle has severely decreased function. The left ventricle demonstrates  global hypokinesis. There is mild left ventricular hypertrophy of the  basal-septal segment. Left  ventricular diastolic parameters are indeterminate. The average left  ventricular global longitudinal strain is -2.8 %. The global longitudinal  strain is abnormal.  2. Right ventricular systolic function is normal. The right ventricular  size is normal. There is severely elevated pulmonary artery systolic  pressure. The estimated right ventricular systolic pressure is 35.3 mmHg.  3. The mitral valve is normal in structure. Mild to moderate mitral valve  regurgitation. No evidence of mitral stenosis.  4. The aortic valve is tricuspid. There is severe calcifcation of the  aortic valve. There is severe thickening of the aortic valve. Aortic valve  regurgitation is mild. No aortic stenosis is present. Aortic valve area,  by VTI measures 0.44 cm. Aortic  valve mean gradient measures 13.5 mmHg. Aortic valve Vmax measures 2.53  m/s. Dimensionless index is 0.15. The transaortic gradients are  underestimated due to severe LV dysfunction. This is consistent with  severe low flow, low gradient aortic stenosis.  5. The inferior vena cava is normal in size with <50% respiratory  variability, suggesting right atrial pressure of 8 mmHg.    Echocardiogram 05/25/2018: - Left ventricle: The cavity size was normal. Wall thickness was  increased in a pattern of moderate LVH. There was mild focal   basal hypertrophy of the septum. Systolic  function was normal.  The estimated ejection fraction was in the range of 50% to 55%.  Wall motion was normal; there were no regional wall motion  abnormalities. Doppler parameters are consistent with abnormal  left ventricular relaxation (grade 1 diastolic dysfunction).  - Aortic valve: Valve mobility was restricted. There was severe  stenosis. There was mild regurgitation.  - Mitral valve: There was mild regurgitation.  - Left atrium: The atrium was moderately dilated.  - Right ventricle: The cavity size was mildly dilated.  - Pulmonary arteries: PA peak pressure: 31 mm Hg (S).     Cardiac catheterization 01/05/2011: RESULTS: Initially the stenosis in the proximal LAD was estimated at 95%, and there was a tandem lesion which was about 50%. The distal flow was TIMI- 2 flow. Following stenting, the two stenoses improved to 0%, and the flow improved to TIMI-3 flow. There was residual 70% stenosis in the mid portion of the posterior descending branch.  CONCLUSION: Successful percutaneous coronary intervention of the lesion in the proximal left anterior descending using a bare metal stent with improvement in narrowing from 95% to 0% and improvement in flow from TIMI-2 to TIMI-3 flow.      Laboratory Data:  High Sensitivity Troponin:   Recent Labs  Lab 05/12/2020 1101 05/15/2020 1302 05/14/2020 1413 05/05/2020 1613  TROPONINIHS 171* 153* 147* 148*     Chemistry Recent Labs  Lab 05/07/20 0506 05/08/20 0502 05/09/20 0350  NA 134* 136 140  K 4.2 4.6 4.2  CL 103 102 103  CO2 15* 17* 21*  GLUCOSE 87 112* 106*  BUN 80* 94* 103*  CREATININE 2.06* 2.23* 2.53*  CALCIUM 8.5* 8.7* 8.9  GFRNONAA 28* 26* 22*  GFRAA 33* 30* 26*  ANIONGAP 16* 17* 16*    Recent Labs  Lab 05/21/2020 1101 05/08/20 0502 05/09/20 0350  PROT 6.1* 6.0* 5.6*  ALBUMIN 3.2* 3.1* 2.9*  AST 50* 59* 47*  ALT 52* 57* 50*  ALKPHOS 155*  185* 181*  BILITOT 2.1* 2.3* 2.1*   Hematology Recent Labs  Lab 05/07/20 0506 05/08/20 0502 05/09/20 0350  WBC 8.7 8.7 9.1  RBC 5.50 5.90* 5.76  HGB 15.9 17.0 16.3  HCT 49.9 53.5* 51.2  MCV 90.7 90.7 88.9  MCH 28.9 28.8 28.3  MCHC 31.9 31.8 31.8  RDW 15.7* 16.3* 16.3*  PLT 245 264 268   BNP Recent Labs  Lab 05/20/2020 1101  BNP 3,817.0*    DDimer No results for input(s): DDIMER in the last 168 hours.   Radiology/Studies:   DG Chest 2 View Result Date: 05/17/2020 CLINICAL DATA:  Shortness of breath, weakness and cough. EXAM: CHEST - 2 VIEW COMPARISON:  None. FINDINGS: The heart is borderline enlarged. There is tortuosity and calcification of the thoracic aorta. No acute pulmonary findings. No infiltrates, edema or effusions. The bony thorax is intact. IMPRESSION: No acute cardiopulmonary findings. Electronically Signed   By: Marijo Sanes M.D.   On: 04/29/2020 12:36     DG CHEST PORT 1 VIEW Result Date: 05/08/2020 CLINICAL DATA:  Shortness of breath. EXAM: PORTABLE CHEST 1 VIEW COMPARISON:  May 06, 2020. FINDINGS: Stable cardiomegaly. No pneumothorax or pleural effusion is noted. Left lung is clear. Right-sided PICC line is noted with distal tip in expected position and of SVC. Right perihilar infiltrate or atelectasis is noted. Bony thorax is unremarkable. IMPRESSION: Right-sided PICC line in grossly good position. Right perihilar infiltrate or atelectasis is noted. Aortic Atherosclerosis (ICD10-I70.0). Electronically Signed   By: Marijo Conception  M.D.   On: 05/08/2020 14:56     Assessment and Plan:   1. Acute CHF 2. New reduction in LVEF 3. Severe AS 4. Abn trop     No CP, avoiding cath with renal insuff though likely eventually  5. Conduction system disease     Baseline in 2019 1st degree AVBlock and a RBBB morphology     He has Mobitz I and seems to have alternating BBB morpholgy on telemetry, perhaps PVCs as well     No bradycardia     Not on any nodal  blocking agents currently       I Cassy Sprowl review EKGs and telemetry with Dr. Curt Bears, he Jashaun Penrose see the patient later today I do not think there is need currently/urgently for pacing, though agree he may in the future, particularly if TAVR pursued..     For questions or updates, please contact [place] Please consult [email or web address] for contact info under    Signed, Baldwin Jamaica, PA-C  05/09/2020 10:52 AM  I have seen and examined this patient with Tommye Standard.  Agree with above, note added to reflect my findings.  On exam, RRR, no murmurs.  Patient presented to the hospital with heart failure and was have severe aortic stenosis.  He also has conduction system disease.  He has plans for further therapy for his heart failure and possible TAVR evaluation.  He is having multiple PACs, some of which are not conducting.  He also has conducted PACs with a left bundle branch block morphology.  At this point, he may require pacemaker in the future if he becomes symptomatic from a rhythm standpoint, otherwise would continue to treat his heart failure and aortic stenosis. Grover Robinson M. Tashauna Caisse MD 05/09/2020 1:05 PM

## 2020-05-09 NOTE — Assessment & Plan Note (Addendum)
--  vs CKD, no previous labs but concern for cardiorenal syndrome. --UOP 575, -1 L since admit (no change) --creatinine higher today --management deferred to cardiology

## 2020-05-09 NOTE — Assessment & Plan Note (Addendum)
--  management as per cardiology --SR w/ episodes of NSVT --K+ WNL

## 2020-05-10 DIAGNOSIS — B370 Candidal stomatitis: Secondary | ICD-10-CM

## 2020-05-10 DIAGNOSIS — R7989 Other specified abnormal findings of blood chemistry: Secondary | ICD-10-CM

## 2020-05-10 DIAGNOSIS — F4321 Adjustment disorder with depressed mood: Secondary | ICD-10-CM

## 2020-05-10 LAB — CBC WITH DIFFERENTIAL/PLATELET
Abs Immature Granulocytes: 0.04 10*3/uL (ref 0.00–0.07)
Basophils Absolute: 0 10*3/uL (ref 0.0–0.1)
Basophils Relative: 0 %
Eosinophils Absolute: 0 10*3/uL (ref 0.0–0.5)
Eosinophils Relative: 0 %
HCT: 48 % (ref 39.0–52.0)
Hemoglobin: 15.6 g/dL (ref 13.0–17.0)
Immature Granulocytes: 0 %
Lymphocytes Relative: 7 %
Lymphs Abs: 0.7 10*3/uL (ref 0.7–4.0)
MCH: 28.7 pg (ref 26.0–34.0)
MCHC: 32.5 g/dL (ref 30.0–36.0)
MCV: 88.2 fL (ref 80.0–100.0)
Monocytes Absolute: 1 10*3/uL (ref 0.1–1.0)
Monocytes Relative: 10 %
Neutro Abs: 8.5 10*3/uL — ABNORMAL HIGH (ref 1.7–7.7)
Neutrophils Relative %: 83 %
Platelets: 251 10*3/uL (ref 150–400)
RBC: 5.44 MIL/uL (ref 4.22–5.81)
RDW: 15.5 % (ref 11.5–15.5)
WBC: 10.2 10*3/uL (ref 4.0–10.5)
nRBC: 0 % (ref 0.0–0.2)

## 2020-05-10 LAB — COMPREHENSIVE METABOLIC PANEL
ALT: 44 U/L (ref 0–44)
AST: 38 U/L (ref 15–41)
Albumin: 2.7 g/dL — ABNORMAL LOW (ref 3.5–5.0)
Alkaline Phosphatase: 148 U/L — ABNORMAL HIGH (ref 38–126)
Anion gap: 12 (ref 5–15)
BUN: 100 mg/dL — ABNORMAL HIGH (ref 8–23)
CO2: 23 mmol/L (ref 22–32)
Calcium: 8.4 mg/dL — ABNORMAL LOW (ref 8.9–10.3)
Chloride: 103 mmol/L (ref 98–111)
Creatinine, Ser: 2.57 mg/dL — ABNORMAL HIGH (ref 0.61–1.24)
GFR calc Af Amer: 25 mL/min — ABNORMAL LOW (ref >60)
GFR calc non Af Amer: 22 mL/min — ABNORMAL LOW (ref >60)
Glucose, Bld: 125 mg/dL — ABNORMAL HIGH (ref 70–99)
Potassium: 3.9 mmol/L (ref 3.5–5.1)
Sodium: 138 mmol/L (ref 135–145)
Total Bilirubin: 2.1 mg/dL — ABNORMAL HIGH (ref 0.3–1.2)
Total Protein: 5.7 g/dL — ABNORMAL LOW (ref 6.5–8.1)

## 2020-05-10 LAB — COOXEMETRY PANEL
Carboxyhemoglobin: 1.4 % (ref 0.5–1.5)
Carboxyhemoglobin: 1.2 % (ref 0.5–1.5)
Methemoglobin: 0.9 % (ref 0.0–1.5)
Methemoglobin: 0.6 % (ref 0.0–1.5)
O2 Saturation: 59.4 %
O2 Saturation: 69.5 %
Total hemoglobin: 16.4 g/dL — ABNORMAL HIGH (ref 12.0–16.0)
Total hemoglobin: 14.8 g/dL (ref 12.0–16.0)

## 2020-05-10 LAB — MAGNESIUM: Magnesium: 2.5 mg/dL — ABNORMAL HIGH (ref 1.7–2.4)

## 2020-05-10 LAB — LACTIC ACID, PLASMA: Lactic Acid, Venous: 1.6 mmol/L (ref 0.5–1.9)

## 2020-05-10 MED ORDER — NYSTATIN 100000 UNIT/ML MT SUSP
5.0000 mL | Freq: Four times a day (QID) | OROMUCOSAL | Status: DC
  Administered 2020-05-10 – 2020-05-15 (×15): 500000 [IU] via ORAL
  Filled 2020-05-10 (×20): qty 5

## 2020-05-10 MED ORDER — SENNOSIDES-DOCUSATE SODIUM 8.6-50 MG PO TABS
2.0000 | ORAL_TABLET | Freq: Two times a day (BID) | ORAL | Status: DC
  Administered 2020-05-10 – 2020-05-15 (×10): 2 via ORAL
  Filled 2020-05-10 (×11): qty 2

## 2020-05-10 MED ORDER — BISACODYL 10 MG RE SUPP: 10.0000 mg | Freq: Every day | RECTAL | Status: DC | PRN

## 2020-05-10 MED ORDER — SORBITOL 70 % SOLN
30.0000 mL | Freq: Once | Status: AC
  Administered 2020-05-10: 30 mL via ORAL
  Filled 2020-05-10: qty 30

## 2020-05-10 NOTE — Assessment & Plan Note (Addendum)
--  resolved, can stop Nystatin 9/24

## 2020-05-10 NOTE — Progress Notes (Signed)
Palliative:   [name] is resting quietly in bed.  He is sleeping, but wakes easily as I enter.  He appears elderly and frail.  He is alert and oriented x3, able to make his basic needs known.  His sister, [name] [name] is at bedside.    [name] [name] shares that he is willing to proceed with TAVR procedure if he is qualified.  We talked about the severity of his cardiac issues, and the anticipated improvements after TAVR.  I ask what the doctors are telling him, and he tells me that one of the doctors encouraged him to get out of bed and up to a chair today.  I shared my agreement.Marland Kitchen  He tells me that he has oral thrush, indicating that maybe he should not get up today.  Both Sister [name] [name] and I encouraged him to get up, thrush should not keep him from movement.  He shares that he has not moved his bowels, we talked about bowel regimen.  [name] [name] shares that it has been a very difficult last few months.  His wife of 60 years had strokes, an MVA, spent 6 weeks at [facility], 40 days in a nursing home, before she died at the end of 04-27-23.  We talked about his grief, and his broken heart including emotional, spiritual, and physical elements.  He had been living independently in his own home prior to this illness/decline of just a few weeks.  He does have help with heavy household chores from neighbors and friends, otherwise, he does his own household management including driving and purchasing groceries  We talked about need for rehab after TAVR procedure.  At this point he is agreeable to short-term rehab, [facility] in [place] would be facility of choice.   Conference with bedside nursing staff related to patient condition, needs, goals of care.  Plan:  Continue to treat the treatable but no CPR or intubation.  Agreeable to TAVR if qualified.  Agreeable to short-term rehab if qualified, [facility] in [place] would be facility of choice.  37 minutes  [name], NP Palliative Medicine Team Team  Phone [phone] Greater than 50% of this time was spent counseling and coordinating care relate dto the above assessment and plan.

## 2020-05-10 NOTE — Progress Notes (Signed)
1405 Received CR order. We will follow pt's progress with PT. Only walked 3 ft today with PT. Graylon Good RN BSN 05/10/2020 2:04 PM

## 2020-05-10 NOTE — Progress Notes (Addendum)
Patient ID: MANPREET STREY, male   DOB: 1932/07/23, 86 y.o.   MRN: [id number]     Advanced Heart Failure Rounding Note  PCP-Cardiologist: Jenkins Rouge, MD   Subjective:    Patient started on milrinone 0.25 yesterday, co-ox 59% this morning.  He got Lasix 80 mg IV x 1 yesterday, CVP 5-6 this morning. BUN/creatinine stable at 100/2.57.   Says he feels "ok," has not been out of bed.    Objective:   Weight Range: 74.2 kg Body mass index is 23.47 kg/m.   Vital Signs:   Temp:  [97.2 F (36.2 C)-97.6 F (36.4 C)] 97.6 F (36.4 C) (09/17 0312) Pulse Rate:  [71-91] 71 (09/17 0835) Resp:  [0-23] 18 (09/17 0835) BP: (90-115)/(61-91) 99/63 (09/17 0312) SpO2:  [91 %-96 %] 95 % (09/17 0835) Weight:  [74.2 kg] 74.2 kg (09/17 0312) Last BM Date: 05/07/20  Weight change: Filed Weights   05/08/20 0503 05/08/20 1842 05/10/20 0312  Weight: 76.4 kg 76.5 kg 74.2 kg    Intake/Output:   Intake/Output Summary (Last 24 hours) at 05/10/2020 0907 Last data filed at 05/10/2020 0314 Gross per 24 hour  Intake 764.31 ml  Output 1550 ml  Net -785.69 ml      Physical Exam    General:  Frail HEENT: Normal Neck: Supple. JVP not elevated. Carotids 2+ bilat; no bruits. No lymphadenopathy or thyromegaly appreciated. Cor: PMI nondisplaced. Regular rate & rhythm. No rubs, gallops. 2/6 SEM RUSB. Lungs: Clear Abdomen: Soft, nontender, nondistended. No hepatosplenomegaly. No bruits or masses. Good bowel sounds. Extremities: No cyanosis, clubbing, rash, edema Neuro: Alert & orientedx3, cranial nerves grossly intact. moves all 4 extremities w/o difficulty. Affect pleasant   Telemetry   Looks like NSR in 80s this morning (personally reviewed)   Labs    CBC Recent Labs    05/09/20 0350 05/10/20 0305  WBC 9.1 10.2  NEUTROABS 7.5 8.5*  HGB 16.3 15.6  HCT 51.2 48.0  MCV 88.9 88.2  PLT 268 417   Basic Metabolic Panel Recent Labs    05/09/20 1140 05/10/20 0305  NA 138 138  K 4.0 3.9  CL  102 103  CO2 22 23  GLUCOSE 131* 125*  BUN 104* 100*  CREATININE 2.52* 2.57*  CALCIUM 8.7* 8.4*  MG 2.7* 2.5*   Liver Function Tests Recent Labs    05/09/20 1140 05/10/20 0305  AST 44* 38  ALT 50* 44  ALKPHOS 167* 148*  BILITOT 2.1* 2.1*  PROT 5.5* 5.7*  ALBUMIN 2.8* 2.7*   No results for input(s): LIPASE, AMYLASE in the last 72 hours. Cardiac Enzymes No results for input(s): CKTOTAL, CKMB, CKMBINDEX, TROPONINI in the last 72 hours.  BNP: BNP (last 3 results) Recent Labs    04/25/2020 1101  BNP 3,817.0*    ProBNP (last 3 results) No results for input(s): PROBNP in the last 8760 hours.   D-Dimer No results for input(s): DDIMER in the last 72 hours. Hemoglobin A1C No results for input(s): HGBA1C in the last 72 hours. Fasting Lipid Panel Recent Labs    05/08/20 0502  CHOL 106  HDL 31*  LDLCALC 57  TRIG 90  CHOLHDL 3.4   Thyroid Function Tests No results for input(s): TSH, T4TOTAL, T3FREE, THYROIDAB in the last 72 hours.  Invalid input(s): FREET3  Other results:   Imaging     No results found.   Medications:     Scheduled Medications: . aspirin EC  81 mg Oral Daily  . Chlorhexidine Gluconate Cloth  6 each Topical Daily  . enoxaparin (LOVENOX) injection  30 mg Subcutaneous Q24H  . ezetimibe  10 mg Oral Daily   And  . simvastatin  20 mg Oral q1800  . mometasone-formoterol  2 puff Inhalation BID  . nystatin  5 mL Oral QID  . omega-3 acid ethyl esters  1 g Oral Daily  . sodium chloride flush  10-40 mL Intracatheter Q12H  . sodium chloride flush  3 mL Intravenous Q12H  . sorbitol  30 mL Oral Once     Infusions: . sodium chloride    . milrinone 0.375 mcg/kg/min (05/10/20 0847)     PRN Medications:  sodium chloride, acetaminophen **OR** acetaminophen, hydrocortisone, hydrOXYzine, ipratropium-albuterol, nitroGLYCERIN, ondansetron **OR** ondansetron (ZOFRAN) IV, sodium chloride flush, sodium chloride flush, witch  hazel-glycerin  Assessment/Plan   1. Acute on chronic systolic CHF: Echo this admission with new EF < 20%, normal RV, suspect low flow/low gradient severe AS. Prior echo in 2019 with severe AS, EF 50-55%. No chest pain, just dyspnea.  I am concerned that this may be the natural history over the last 2 years of severe AS.  It appears that he was lost to followup after 2019 echo. He has a history of CAD, so cannot rule out a component of ischemic cardiomyopathy.  He did not present with ACS.  On exam, he is not volume overloaded and CVP is 6 today.  Elevated BUN and creatinine consistent with cardiorenal syndrome.  Co-ox 59%.  - Increase milrinone to 0.375 mcg/kg/min.  Repeat co-ox.   - Hold diuretics with low CVP.  - Will need RHC/LHC if his creatinine improves.  - Ultimately, would have TAVR.  However, I am concerned that we may not be able to get him there (renal dysfunction, frailty).  - Cardiac amyloidosis is certainly a concern with AS, conduction system disease.  Eventual cMRI if he stabilizes.  2. Aortic stenosis: Low flow/low gradient severe AS.  In 2019, echo showed severe AS.  As above, I am concerned that EF is low primarily due to untreated AS.  His only option at this point would be TAVR, but he will need considerable improvement prior and it is not clear that he will make it to TAVR given frailty and renal dysfunction.  3. AKI: Creatinine up to 2.57 with BUN 100.  He is not volume overloaded on exam, co-ox somewhat marginal at 59%.  BP stable.  Suspect cardiorenal syndrome.  - Increasing milrinone.  - Hold diuretics.   4. CAD: PCI in 2006.  Presentation not consistent with ACS, no chest pain.  HS-TnI mildly elevated with no trend.   - Continue ASA, statin.  - Ideally would have cath but need improvement in creatinine.  5. Rhythm: Has been markedly abnormal.  Baseline NSR with long 1st degree AVB.  He appears to have alternating bundle branch blocks.  He also appears to have Wenckebach  periodicity at times.  Significant conduction system disease, if he is able to make it to TAVR, I suspect he will need a pacemaker.  - This morning, appears to be in NSR.  Will get ECG.  6. Deconditioning: Mobilize with cardiac rehab, PT.   Length of Stay: 4  Loralie Champagne, MD  05/10/2020, 9:07 AM  Advanced Heart Failure Team Pager [phone] (M-F; 7a - 4p)  Please contact [facility] for night-coverage after hours (4p -7a ) and weekends on [email or web address]

## 2020-05-10 NOTE — Progress Notes (Signed)
This chaplain responded to PMT consult for prayer and Advance Directive education.  The Pt. sister-Jane is bedside and encourages the conversation with the Pt. about the AD.  The chaplain understands sister-Jane Chamelin will be the Pt. Healthcare Agent when the document is complete.  The chaplain will return on Monday to finish the education and possibly facilitate notarizing the document.  The Pt. accepted the chaplain's invitation for prayer and F/U spiritual care.

## 2020-05-10 NOTE — Progress Notes (Signed)
Physical Therapy Treatment Patient Details Name: [name] [name] MRN: [id number] DOB: 02-May-1932 Today's Date: 05/10/2020    History of Present Illness Pt is 86 yo male admitted with elevated troponins, weakness, FTT.  Pt transferred from [facility] to [facility] with CHF and now on milrinone.  Hx of aortic stenosis, CAD, spinal stenosis. Per chart, pt has had difficulty managing at home since wife passed away recently.    PT Comments    Pt reports significant weakness/fatigue and only agreeable to transfer to chair.  His VS were stable throughout session.  Required min-mod A for transfers and to ambulate 3' to chair with RW.  Required encouragement and education on importance of activity and getting OOB.  Pt had transferred to [facility] from [facility], however, goals remain appropriate.  Will cont POC.      Follow Up Recommendations  SNF     Equipment Recommendations  None recommended by PT    Recommendations for Other Services       Precautions / Restrictions Precautions Precautions: Fall    Mobility  Bed Mobility Overal bed mobility: Needs Assistance Bed Mobility: Supine to Sit     Supine to sit: Mod assist     General bed mobility comments: Cues for safety. Increased time.; mod A to lift trunk and scoot forward  Transfers Overall transfer level: Needs assistance Equipment used: Rolling walker (2 wheeled) Transfers: Sit to/from Stand Sit to Stand: Mod assist         General transfer comment: Cues for hand placement and mod A to stand  Ambulation/Gait Ambulation/Gait assistance: Min assist Gait Distance (Feet): 3 Feet Assistive device: Rolling walker (2 wheeled) Gait Pattern/deviations: Step-through pattern;Decreased stride length Gait velocity: decreased   General Gait Details: Assist to steady. Distance limited by weakness. Cues for RW use, posture, and safety   Stairs             Wheelchair Mobility    Modified Rankin (Stroke Patients Only)       Balance  Overall balance assessment: Needs assistance   Sitting balance-Leahy Scale: Poor Sitting balance - Comments: required bil UE support but no physical assist   Standing balance support: Bilateral upper extremity supported Standing balance-Leahy Scale: Poor Standing balance comment: required RW and min guard                            Cognition Arousal/Alertness: Awake/alert Behavior During Therapy: Flat affect Overall Cognitive Status: Within Functional Limits for tasks assessed                                        Exercises      General Comments General comments (skin integrity, edema, etc.): Pt with flat affect, focused on thrush in mouth, and reports too weak to go further than too chair      Pertinent Vitals/Pain Pain Assessment: No/denies pain    Home Living                      Prior Function            PT Goals (current goals can now be found in the care plan section) Acute Rehab PT Goals Patient Stated Goal: to regain strength PT Goal Formulation: With patient Time For Goal Achievement: 05/21/20 Potential to Achieve Goals: Good Progress towards PT goals: Not progressing toward  goals - comment (decline in medical status and tx to [facility])    Frequency    Min 2X/week      PT Plan Current plan remains appropriate    Co-evaluation              AM-PAC PT "6 Clicks" Mobility   Outcome Measure  Help needed turning from your back to your side while in a flat bed without using bedrails?: A Lot Help needed moving from lying on your back to sitting on the side of a flat bed without using bedrails?: A Lot Help needed moving to and from a bed to a chair (including a wheelchair)?: A Lot Help needed standing up from a chair using your arms (e.g., wheelchair or bedside chair)?: A Lot Help needed to walk in hospital room?: A Lot Help needed climbing 3-5 steps with a railing? : A Lot 6 Click Score: 12    End of Session  Equipment Utilized During Treatment: Gait belt Activity Tolerance: Patient limited by fatigue Patient left: in chair;with call bell/phone within reach Nurse Communication: Mobility status PT Visit Diagnosis: Muscle weakness (generalized) (M62.81);Unsteadiness on feet (R26.81)     Time: 6681-5947 PT Time Calculation (min) (ACUTE ONLY): 20 min  Charges:  $Therapeutic Activity: 8-22 mins                     Abran Richard, PT Acute Rehab Services Pager [phone] [facility] [place] 05/10/2020, 1:55 PM

## 2020-05-10 NOTE — Progress Notes (Addendum)
PROGRESS NOTE  [name] [name] UXN:[id number] DOB: 11-Sep-1931 DOA: 05/14/2020 PCP: Burnard Bunting, MD  Brief History   47yom w/ CAD, aortic stenosis, presented w/ SOB, hypotensive, found to LVEF <20% w/ severe AS. Transferred to [facility] to see AHF team and now on milrinone. Other issue is AKI which is stable today. Added Nystatin for thrush.   A & P   AKI (acute kidney injury) ([place]) --vs CKD, no previous labs but concern for cardiorenal syndrome. --secondary to acute CHF --UOP 1 L --stable today; continue management per cardiology  Elevated LFTs --secondary to CHF congestive hepatopathy --transaminases WNL --Tbili stable; expect will improve w/ CHF treatment  Aortic stenosis, severe --per cardiology, TAVR being considered if clinical condition improves  COPD (chronic obstructive pulmonary disease) ([place]) --appears stable  Elevated troponin --probably secondary to acute CHF. Management per cardiology  Grief reaction --from death of wife recently in MVA --hydroxyzine nightly as needed for insomnia as per psychiatry  Acute systolic CHF (congestive heart failure) ([place]) --continue as per cardiology --milrinone upped today  Abnormal heart rhythm --as per cardiology --SR w/ multiple episodes of NSVT --K+ 3.9 will give KCL x1 --Mg 2.5  Oral candidiasis --add Nystatin   Disposition Plan:  Discussion: continue management as per cardiology   Status is: Inpatient  Remains inpatient appropriate because:IV treatments appropriate due to intensity of illness or inability to take PO and Inpatient level of care appropriate due to severity of illness   Dispo: The patient is from: Home              Anticipated d/c is to: TBD              Anticipated d/c date is: > 3 days              Patient currently is not medically stable to d/c.  DVT prophylaxis:  Place TED hose Start: 05/09/20 0920 enoxaparin (LOVENOX) injection 30 mg Start: 05/03/2020 1800 Code Status: DNR Family  Communication:   Murray Hodgkins, MD  [facility] Direct contact: see [email or web address] (further directions at bottom of note if needed) 7PM-7AM contact night coverage as at bottom of note 05/10/2020, 9:04 AM  LOS: 4 days   Significant Hospital Events   .    Consults:  .    Procedures:  .   Significant Diagnostic Tests:  Marland Kitchen    Micro Data:  .    Antimicrobials:  .   Interval History/Subjective  F/u CHF Poor appetite per RN, milrinone upped Pt reports poor appetite, but eating and drinking some Things taste like castor oil  Objective   Vitals:  Vitals:   05/10/20 0312 05/10/20 0835  BP: 99/63   Pulse: 72 71  Resp: 20 18  Temp: 97.6 F (36.4 C)   SpO2: 95% 95%    Exam: Constitutional:   . Appears calm and comfortable lying in bed ENMT:  . grossly normal hearing  . Thick exudate white, tongue and buccal mucosa Respiratory:  . CTA bilaterally, no w/r/r.  . Respiratory effort normal.  Cardiovascular:  . RRR, 2/6 holosystolic murmur, RUSB, no r/g . No LE extremity edema   Psychiatric:  . Mental status o Mood, affect appropriate  I have personally reviewed the following:   Today's Data  . Creatinine 2.23 > 2.53 > 2.57 . K+ 4.2 . AST and ALT wow WNL . Tbili  2.3 > 2.1  Scheduled Meds: . aspirin EC  81 mg Oral Daily  . Chlorhexidine Gluconate Cloth  6  each Topical Daily  . enoxaparin (LOVENOX) injection  30 mg Subcutaneous Q24H  . ezetimibe  10 mg Oral Daily   And  . simvastatin  20 mg Oral q1800  . mometasone-formoterol  2 puff Inhalation BID  . nystatin  5 mL Oral QID  . omega-3 acid ethyl esters  1 g Oral Daily  . sodium chloride flush  10-40 mL Intracatheter Q12H  . sodium chloride flush  3 mL Intravenous Q12H  . sorbitol  30 mL Oral Once   Continuous Infusions: . sodium chloride    . milrinone 0.375 mcg/kg/min (05/10/20 0847)    Principal Problem:   Acute systolic CHF (congestive heart failure) (HCC) Active Problems:   Aortic  stenosis, severe   AKI (acute kidney injury) (HCC)   Abnormal heart rhythm   Oral candidiasis   HYPERTENSION, BENIGN   CAD, NATIVE VESSEL   Elevated troponin   Weakness   Fall   COPD (chronic obstructive pulmonary disease) (HCC)   Pressure injury of skin   Elevated LFTs   Goals of care, counseling/discussion   Palliative care by specialist   Grief reaction   LOS: 4 days   How to contact the [facility] Attending or Consulting provider [place] or covering provider during after hours [place], for this patient?  1. Check the care team in [facility] and look for a) attending/consulting TRH provider listed and b) the [facility] team listed 2. Log into [email or web address] and use [place]'s universal password to access. If you do not have the password, please contact the hospital operator. 3. Locate the [facility] provider you are looking for under [facility] and page to a number that you can be directly reached. 4. If you still have difficulty reaching the provider, please page the [facility] (Director on Call) for the Hospitalists listed on amion for assistance.

## 2020-05-11 LAB — BASIC METABOLIC PANEL
Anion gap: 12 (ref 5–15)
BUN: 84 mg/dL — ABNORMAL HIGH (ref 8–23)
CO2: 23 mmol/L (ref 22–32)
Calcium: 7.8 mg/dL — ABNORMAL LOW (ref 8.9–10.3)
Chloride: 98 mmol/L (ref 98–111)
Creatinine, Ser: 2.17 mg/dL — ABNORMAL HIGH (ref 0.61–1.24)
GFR calc Af Amer: 31 mL/min — ABNORMAL LOW (ref >60)
GFR calc non Af Amer: 27 mL/min — ABNORMAL LOW (ref >60)
Glucose, Bld: 137 mg/dL — ABNORMAL HIGH (ref 70–99)
Potassium: 3.3 mmol/L — ABNORMAL LOW (ref 3.5–5.1)
Sodium: 133 mmol/L — ABNORMAL LOW (ref 135–145)

## 2020-05-11 LAB — COOXEMETRY PANEL
Carboxyhemoglobin: 1.8 % — ABNORMAL HIGH (ref 0.5–1.5)
Methemoglobin: 1 % (ref 0.0–1.5)
O2 Saturation: 72 %
Total hemoglobin: 14 g/dL (ref 12.0–16.0)

## 2020-05-11 LAB — MAGNESIUM: Magnesium: 2.4 mg/dL (ref 1.7–2.4)

## 2020-05-11 MED ORDER — POTASSIUM CHLORIDE CRYS ER 20 MEQ PO TBCR
40.0000 meq | EXTENDED_RELEASE_TABLET | Freq: Two times a day (BID) | ORAL | Status: AC
  Administered 2020-05-11 (×2): 40 meq via ORAL
  Filled 2020-05-11 (×2): qty 2

## 2020-05-11 NOTE — Progress Notes (Signed)
PROGRESS NOTE  [name] [name] EAV:[id number] DOB: [birth year] DOA: 04/26/2020 PCP: Burnard Bunting, MD  Brief History   69yom w/ CAD, aortic stenosis, presented w/ SOB, hypotensive, found to LVEF <20% w/ severe AS. Transferred to [facility] to see AHF team and now on milrinone. Other issue is AKI which is stable today. Added Nystatin for thrush.   A & P   AKI (acute kidney injury) ([place]) --vs CKD, no previous labs but concern for cardiorenal syndrome. --secondary to acute CHF --UOP 825, -1.1 L since admit --creatinine better today; continue management as per cardiology  Elevated LFTs --secondary to CHF congestive hepatopathy --transaminases normalized --Tbili was high yesterday; expect will improve w/ CHF treatment --CMP in AM  Aortic stenosis, severe --continue as per cardiology, TAVR being considered if clinical condition improves  COPD (chronic obstructive pulmonary disease) ([place]) --appears stable, ctoninue nebs and Dulera  Elevated troponin --probably secondary to acute CHF. Management per cardiology  Grief reaction --from death of wife recently in MVA --hydroxyzine nightly as needed for insomnia as per psychiatry  Acute systolic CHF (congestive heart failure) ([place]) --continue as per cardiology including milrinone, statin  Abnormal heart rhythm --continue as per cardiology --SR w/ multiple an episode of NSVT --K+ 3.3 will give KCL x1 --Mg 2.5 yesterday --CMP and Mg in AM  Oral candidiasis --better today; continue Nystatin   Disposition Plan:  Discussion: continue management as per cardiology, no new issues  Status is: Inpatient  Remains inpatient appropriate because:IV treatments appropriate due to intensity of illness or inability to take PO and Inpatient level of care appropriate due to severity of illness   Dispo: The patient is from: Home              Anticipated d/c is to: TBD              Anticipated d/c date is: > 3 days              Patient currently is  not medically stable to d/c.  DVT prophylaxis:  Place TED hose Start: 05/09/20 0920 enoxaparin (LOVENOX) injection 30 mg Start: 04/26/2020 1800 Code Status: DNR Family Communication:   Murray Hodgkins, MD  [facility] Direct contact: see [email or web address] (further directions at bottom of note if needed) 7PM-7AM contact night coverage as at bottom of note 05/11/2020, 9:46 AM  LOS: 5 days   Significant Hospital Events   . 9/13 admit for CHF   Consults:  . AHF   Procedures:  . PICC  Significant Diagnostic Tests:  Marland Kitchen    Micro Data:  .    Antimicrobials:  .   Interval History/Subjective  F/u CHF Slept ok Eating ok Mouth feels a little better Breathing ok  Objective   Vitals:  Vitals:   05/11/20 0752 05/11/20 0817  BP: 103/65   Pulse: 77   Resp: 20   Temp: 97.8 F (36.6 C)   SpO2: 96% 95%    Exam: Constitutional:   . Appears calm and comfortable, lying flat ENMT:  . grossly normal hearing  . Tongue w/ little white exudate Respiratory:  . CTA bilaterally, no w/r/r.  . Respiratory effort normal.  Cardiovascular:  . RRR, no m/r/g . Telemetry SR, NSVT brief . No LE extremity edema   Musculoskeletal:  . RUE, LUE, RLE, LLE   . Moves all extremities to command Psychiatric:  . Mental status o Mood, affect appropriate  I have personally reviewed the following:   Today's Data  . Creatinine 2.23 > 2.53 >  2.57 > 2.17 . K+ 3.3 . Tbili  2.3 > 2.1 > not checked  Scheduled Meds: . aspirin EC  81 mg Oral Daily  . Chlorhexidine Gluconate Cloth  6 each Topical Daily  . enoxaparin (LOVENOX) injection  30 mg Subcutaneous Q24H  . ezetimibe  10 mg Oral Daily   And  . simvastatin  20 mg Oral q1800  . mometasone-formoterol  2 puff Inhalation BID  . nystatin  5 mL Oral QID  . omega-3 acid ethyl esters  1 g Oral Daily  . senna-docusate  2 tablet Oral BID  . sodium chloride flush  10-40 mL Intracatheter Q12H  . sodium chloride flush  3 mL Intravenous Q12H    Continuous Infusions: . sodium chloride    . milrinone 0.375 mcg/kg/min (05/11/20 0640)    Principal Problem:   Acute systolic CHF (congestive heart failure) (HCC) Active Problems:   Aortic stenosis, severe   AKI (acute kidney injury) (HCC)   Abnormal heart rhythm   Oral candidiasis   HYPERTENSION, BENIGN   CAD, NATIVE VESSEL   Elevated troponin   Weakness   Fall   COPD (chronic obstructive pulmonary disease) (HCC)   Pressure injury of skin   Elevated LFTs   Goals of care, counseling/discussion   Palliative care by specialist   Grief reaction   LOS: 5 days   How to contact the [facility] Attending or Consulting provider [place] or covering provider during after hours [place], for this patient?  1. Check the care team in [facility] and look for a) attending/consulting TRH provider listed and b) the [facility] team listed 2. Log into [email or web address] and use [place]'s universal password to access. If you do not have the password, please contact the hospital operator. 3. Locate the [facility] provider you are looking for under [facility] and page to a number that you can be directly reached. 4. If you still have difficulty reaching the provider, please page the [facility] (Director on Call) for the Hospitalists listed on amion for assistance.

## 2020-05-11 NOTE — Progress Notes (Signed)
Pt spent some hours in a recliner this afternoon, ambulated in a room with help of walker and a nurse. Pt has 2 episodes of some beats of V-tach especially when pt moving, otherwise vitals stable. MD notified. Milrinone continue, CVP is 9 latest, family member was in bed side and updated, will continue to monitor the patient  Palma Holter, RN

## 2020-05-11 NOTE — Progress Notes (Signed)
Patient ID: [name] [name], male   DOB: Jan 23, 1932, 86 y.o.   MRN: [id number]     Advanced Heart Failure Rounding Note  PCP-Cardiologist: Jenkins Rouge, MD   Subjective:    On milrinone 0.375. Off IV lasix Co-ox 70% Creatinine 2.57 -> 2.17 -> 1.89  Breathing ok. Remains very weak. No orthopnea or PND.  CVP 12. Says his legs feel full.    Objective:   Weight Range: 76.7 kg Body mass index is 24.26 kg/m.   Vital Signs:   Temp:  [97.5 F (36.4 C)-98.2 F (36.8 C)] 97.8 F (36.6 C) (09/18 0752) Pulse Rate:  [77-83] 77 (09/18 0752) Resp:  [19-20] 20 (09/18 0752) BP: (95-104)/(61-78) 103/65 (09/18 0752) SpO2:  [93 %-98 %] 95 % (09/18 0817) Weight:  [76.7 kg] 76.7 kg (09/18 0326) Last BM Date: 05/07/20  Weight change: Filed Weights   05/08/20 1842 05/10/20 0312 05/11/20 0326  Weight: 76.5 kg 74.2 kg 76.7 kg    Intake/Output:   Intake/Output Summary (Last 24 hours) at 05/11/2020 0954 Last data filed at 05/11/2020 0752 Gross per 24 hour  Intake 724.15 ml  Output 925 ml  Net -200.85 ml      Physical Exam    General:  Frail elderly . No resp difficulty HEENT: normal Neck: supple. no JVD. Carotids 2+ bilat; + bruits. No lymphadenopathy or thryomegaly appreciated. Cor: PMI nondisplaced. Regular rate & rhythm. 2/6 AS Lungs: clear Abdomen: soft, nontender, nondistended. No hepatosplenomegaly. No bruits or masses. Good bowel sounds. Extremities: no cyanosis, clubbing, rash, 1+ pedal edema Neuro: alert & orientedx3, cranial nerves grossly intact. moves all 4 extremities w/o difficulty. Affect pleasant    Telemetry   NSR in 80s this morning (personally reviewed)   Labs    CBC Recent Labs    05/09/20 0350 05/10/20 0305  WBC 9.1 10.2  NEUTROABS 7.5 8.5*  HGB 16.3 15.6  HCT 51.2 48.0  MCV 88.9 88.2  PLT 268 956   Basic Metabolic Panel Recent Labs    05/09/20 1140 05/09/20 1140 05/10/20 0305 05/11/20 0421  NA 138   < > 138 133*  K 4.0   < > 3.9 3.3*  CL  102   < > 103 98  CO2 22   < > 23 23  GLUCOSE 131*   < > 125* 137*  BUN 104*   < > 100* 84*  CREATININE 2.52*   < > 2.57* 2.17*  CALCIUM 8.7*   < > 8.4* 7.8*  MG 2.7*  --  2.5*  --    < > = values in this interval not displayed.   Liver Function Tests Recent Labs    05/09/20 1140 05/10/20 0305  AST 44* 38  ALT 50* 44  ALKPHOS 167* 148*  BILITOT 2.1* 2.1*  PROT 5.5* 5.7*  ALBUMIN 2.8* 2.7*   No results for input(s): LIPASE, AMYLASE in the last 72 hours. Cardiac Enzymes No results for input(s): CKTOTAL, CKMB, CKMBINDEX, TROPONINI in the last 72 hours.  BNP: BNP (last 3 results) Recent Labs    05/17/2020 1101  BNP 3,817.0*    ProBNP (last 3 results) No results for input(s): PROBNP in the last 8760 hours.   D-Dimer No results for input(s): DDIMER in the last 72 hours. Hemoglobin A1C No results for input(s): HGBA1C in the last 72 hours. Fasting Lipid Panel No results for input(s): CHOL, HDL, LDLCALC, TRIG, CHOLHDL, LDLDIRECT in the last 72 hours. Thyroid Function Tests No results for input(s): TSH, T4TOTAL, T3FREE,  THYROIDAB in the last 72 hours.  Invalid input(s): FREET3  Other results:   Imaging    No results found.   Medications:     Scheduled Medications: . aspirin EC  81 mg Oral Daily  . Chlorhexidine Gluconate Cloth  6 each Topical Daily  . enoxaparin (LOVENOX) injection  30 mg Subcutaneous Q24H  . ezetimibe  10 mg Oral Daily   And  . simvastatin  20 mg Oral q1800  . mometasone-formoterol  2 puff Inhalation BID  . nystatin  5 mL Oral QID  . omega-3 acid ethyl esters  1 g Oral Daily  . senna-docusate  2 tablet Oral BID  . sodium chloride flush  10-40 mL Intracatheter Q12H  . sodium chloride flush  3 mL Intravenous Q12H    Infusions: . sodium chloride    . milrinone 0.375 mcg/kg/min (05/11/20 0640)    PRN Medications: sodium chloride, acetaminophen **OR** acetaminophen, bisacodyl, hydrocortisone, hydrOXYzine, ipratropium-albuterol,  nitroGLYCERIN, ondansetron **OR** ondansetron (ZOFRAN) IV, sodium chloride flush, sodium chloride flush, witch hazel-glycerin  Assessment/Plan   1. Acute on chronic systolic CHF: Echo this admission with new EF < 20%, normal RV, suspect low flow/low gradient severe AS. Prior echo in 2019 with severe AS, EF 50-55%. No chest pain, just dyspnea.  I am concerned that this may be the natural history over the last 2 years of severe AS.  It appears that he was lost to followup after 2019 echo. He has a history of CAD, so cannot rule out a component of ischemic cardiomyopathy.  He did not present with ACS. On milrinone 0.375. Co-ox 70% Has been off lasix with AKI. Volume status increasing. REnal function improving - Continue milrinone 0.375 mcg/kg/min for now.  Billey Gosling give one dose 80IV lasix today   - Will need RHC/LHC if his creatinine improves.  - Ultimately, would have TAVR.  However, we remain concerned that we may not be able to get him there (renal dysfunction, frailty).  - Cardiac amyloidosis is certainly a concern with AS, conduction system disease.  Eventual cMRI if he stabilizes.  2. Aortic stenosis: Low flow/low gradient severe AS.  In 2019, echo showed severe AS.  As above, we are concerned that EF is low primarily due to untreated AS.  His only option at this point would be TAVR, but he will need considerable improvement prior and it is not clear that he will make it to TAVR given frailty and renal dysfunction.  - continue to follow. Structural team aware 3. AKI: Creatinine improving with milrinone  2.57 -> 2.17 -> 1.89 with BUN 100 -> 84 -> 74.  (Creatinine 1.3 in 2016) He is not volume overloaded on exam - Continue milrinone.  Billey Gosling give one dose lasix today. Watch renal function closely  4. CAD: PCI in 2006.  Presentation not consistent with ACS, no chest pain.  HS-TnI mildly elevated with no trend.   - No s/s angina currently.  - Continue ASA, statin.  - Ideally would have cath but need  improvement in creatinine.  5. Rhythm: Has been markedly abnormal.  Baseline NSR with long 1st degree AVB.  He appears to have alternating bundle branch blocks.  He also appears to have Wenckebach periodicity at times.  Significant conduction system disease, if he is able to make it to TAVR, I suspect he will need a pacemaker.  - remains in NSR with wenckebach at times. No change today 6. Deconditioning: Mobilize with cardiac rehab, PT.  7. Hypokalemia -  K 4.4 today 8. Deconditioning - PT/OT  Length of Stay: [place], MD  05/11/2020, 9:54 AM  Advanced Heart Failure Team Pager [phone] (M-F; 7a - 4p)  Please contact [facility] for night-coverage after hours (4p -7a ) and weekends on [email or web address]

## 2020-05-11 NOTE — Evaluation (Signed)
Occupational Therapy Evaluation Patient Details Name: [name] [name] MRN: [id number] DOB: 06/10/1932 Today's Date: 05/11/2020    History of Present Illness Pt is 86 yo male admitted with elevated troponins, weakness, FTT.  Pt transferred from [facility] to [facility] with CHF and now on milrinone.  Hx of aortic stenosis, CAD, spinal stenosis. Per chart, pt has had difficulty managing at home since wife passed away recently.   Clinical Impression   Pt was independent prior to admission. Presents with generalized weakness and impaired standing balance. He requires up to min assist for ADL and mobility. Recommending SNF for continued rehab prior to returning home. Will follow acutely.    Follow Up Recommendations  SNF    Equipment Recommendations       Recommendations for Other Services       Precautions / Restrictions Precautions Precautions: Fall      Mobility Bed Mobility Overal bed mobility: Needs Assistance Bed Mobility: Supine to Sit     Supine to sit: Supervision     General bed mobility comments: heavy use of rail, increased time, HOB up slightly  Transfers Overall transfer level: Needs assistance Equipment used: Rolling walker (2 wheeled) Transfers: Sit to/from Stand Sit to Stand: Min guard         General transfer comment: cues for technique    Balance Overall balance assessment: Needs assistance   Sitting balance-Leahy Scale: Fair     Standing balance support: Bilateral upper extremity supported Standing balance-Leahy Scale: Poor Standing balance comment: required RW and min guard                           ADL either performed or assessed with clinical judgement   ADL Overall ADL's : Needs assistance/impaired Eating/Feeding: Independent;Sitting   Grooming: Wash/dry hands;Wash/dry face;Sitting;Set up   Upper Body Bathing: Set up;Sitting   Lower Body Bathing: Sit to/from stand;Minimal assistance   Upper Body Dressing : Sitting;Minimal assistance    Lower Body Dressing: Sit to/from stand;Minimal assistance   Toilet Transfer: Stand-pivot;RW;BSC;Minimal assistance   Toileting- Clothing Manipulation and Hygiene: Minimal assistance               Vision Patient Visual Report: No change from baseline       Perception     Praxis      Pertinent Vitals/Pain Pain Assessment: No/denies pain     Hand Dominance Right   Extremity/Trunk Assessment Upper Extremity Assessment Upper Extremity Assessment: Overall WFL for tasks assessed   Lower Extremity Assessment Lower Extremity Assessment: Defer to PT evaluation   Cervical / Trunk Assessment Cervical / Trunk Assessment: Normal   Communication Communication Communication: HOH   Cognition Arousal/Alertness: Awake/alert Behavior During Therapy: WFL for tasks assessed/performed Overall Cognitive Status: Within Functional Limits for tasks assessed                                     General Comments       Exercises     Shoulder Instructions      Home Living Family/patient expects to be discharged to:: Skilled nursing facility Living Arrangements: Alone   Type of Home: House Home Access: Stairs to enter Entrance Stairs-Number of Steps: 4   Home Layout: One level               Home Equipment: Walker - 2 wheels;Bedside commode  Prior Functioning/Environment Level of Independence: Independent        Comments: wife died on 04/26/23        OT Problem List: Impaired balance (sitting and/or standing);Decreased strength;Decreased knowledge of use of DME or AE      OT Treatment/Interventions: Self-care/ADL training;DME and/or AE instruction;Patient/family education;Balance training;Therapeutic activities    OT Goals(Current goals can be found in the care plan section) Acute Rehab OT Goals Patient Stated Goal: to regain strength OT Goal Formulation: With patient Time For Goal Achievement: 05/25/20 Potential to Achieve Goals:  Good ADL Goals Pt Will Perform Grooming: with supervision;standing Pt Will Perform Upper Body Dressing: with supervision;sitting Pt Will Perform Lower Body Dressing: with supervision;sit to/from stand Pt Will Transfer to Toilet: with supervision;ambulating Pt Will Perform Toileting - Clothing Manipulation and hygiene: with supervision;sit to/from stand  OT Frequency: Min 2X/week   Barriers to D/C: Decreased caregiver support          Co-evaluation              AM-PAC OT "6 Clicks" Daily Activity     Outcome Measure Help from another person eating meals?: None Help from another person taking care of personal grooming?: A Little Help from another person toileting, which includes using toliet, bedpan, or urinal?: A Little Help from another person bathing (including washing, rinsing, drying)?: A Little Help from another person to put on and taking off regular upper body clothing?: A Little Help from another person to put on and taking off regular lower body clothing?: A Little 6 Click Score: 19   End of Session Equipment Utilized During Treatment: Gait belt;Rolling walker Nurse Communication: Mobility status  Activity Tolerance: Patient tolerated treatment well Patient left: in chair;with call bell/phone within reach  OT Visit Diagnosis: Unsteadiness on feet (R26.81);Other abnormalities of gait and mobility (R26.89);Muscle weakness (generalized) (M62.81)                Time: 4709-2957 OT Time Calculation (min): 22 min Charges:  OT General Charges $OT Visit: 1 Visit OT Evaluation $OT Eval Moderate Complexity: 1 Mod  Nestor Lewandowsky, OTR/L Acute Rehabilitation Services Pager: [phone] Office: [phone] Malka So 05/11/2020, 12:16 PM

## 2020-05-11 NOTE — Plan of Care (Signed)

## 2020-05-12 LAB — MAGNESIUM: Magnesium: 2.4 mg/dL (ref 1.7–2.4)

## 2020-05-12 LAB — COMPREHENSIVE METABOLIC PANEL
ALT: 34 U/L (ref 0–44)
AST: 37 U/L (ref 15–41)
Albumin: 2.3 g/dL — ABNORMAL LOW (ref 3.5–5.0)
Alkaline Phosphatase: 98 U/L (ref 38–126)
Anion gap: 9 (ref 5–15)
BUN: 74 mg/dL — ABNORMAL HIGH (ref 8–23)
CO2: 24 mmol/L (ref 22–32)
Calcium: 8 mg/dL — ABNORMAL LOW (ref 8.9–10.3)
Chloride: 98 mmol/L (ref 98–111)
Creatinine, Ser: 1.89 mg/dL — ABNORMAL HIGH (ref 0.61–1.24)
GFR calc Af Amer: 36 mL/min — ABNORMAL LOW (ref >60)
GFR calc non Af Amer: 31 mL/min — ABNORMAL LOW (ref >60)
Glucose, Bld: 102 mg/dL — ABNORMAL HIGH (ref 70–99)
Potassium: 4.4 mmol/L (ref 3.5–5.1)
Sodium: 131 mmol/L — ABNORMAL LOW (ref 135–145)
Total Bilirubin: 2.1 mg/dL — ABNORMAL HIGH (ref 0.3–1.2)
Total Protein: 5.3 g/dL — ABNORMAL LOW (ref 6.5–8.1)

## 2020-05-12 LAB — COOXEMETRY PANEL
Carboxyhemoglobin: 2 % — ABNORMAL HIGH (ref 0.5–1.5)
Methemoglobin: 0.9 % (ref 0.0–1.5)
O2 Saturation: 69.6 %
Total hemoglobin: 13.7 g/dL (ref 12.0–16.0)

## 2020-05-12 MED ORDER — FUROSEMIDE 10 MG/ML IJ SOLN
80.0000 mg | Freq: Once | INTRAMUSCULAR | Status: AC
  Administered 2020-05-12: 80 mg via INTRAVENOUS
  Filled 2020-05-12: qty 8

## 2020-05-12 NOTE — Progress Notes (Signed)
PROGRESS NOTE  [name] [name] LOV:[id number] DOB: 16-Jul-1932 DOA: 05/05/2020 PCP: [name] Bunting, MD  Brief History   19yom w/ CAD, aortic stenosis, presented w/ SOB, hypotensive, found to LVEF <20% w/ severe AS. Transferred to [facility] to see AHF team and remains on milrinone. Other issues: AKI which is improved today, thrush improving. Continue Rx for CHF per cadiology. TAVR also may be considered.   A & P   AKI (acute kidney injury) ([place]) --vs CKD, no previous labs but concern for cardiorenal syndrome. --secondary to acute CHF --UOP 500, -1.1 L since admit (no change) --creatinine better today; defer management as per cardiology  Elevated LFTs --secondary to CHF congestive hepatopathy --transaminases have normalized --Tbili stable just over 2   Aortic stenosis, severe --continue as per cardiology, TAVR being considered   COPD (chronic obstructive pulmonary disease) ([place]) --appears stable, continue nebs and Dulera  Elevated troponin --probably secondary to acute CHF. Management per cardiology  Grief reaction --from death of wife recently in MVA --appears stable, will continue hydroxyzine nightly as needed for insomnia as per psychiatry  Acute systolic CHF (congestive heart failure) ([place]) --continue as per cardiology including milrinone,  statin  Abnormal heart rhythm --deferred to cardiology --SR w/ episodes of NSVT --K+ and Mg2+ at goal --BMP in AM  Oral candidiasis --slowly improving, will continue Nystatin   Disposition Plan:  Discussion: continue management as per cardiology, no new issues  Status is: Inpatient  Remains inpatient appropriate because:IV treatments appropriate due to intensity of illness or inability to take PO and Inpatient level of care appropriate due to severity of illness   Dispo: The patient is from: Home              Anticipated d/c is to: TBD              Anticipated d/c date is: > 3 days              Patient currently is not  medically stable to d/c.  DVT prophylaxis:  Place TED hose Start: 05/09/20 0920 enoxaparin (LOVENOX) injection 30 mg Start: 05/01/2020 1800 Code Status: DNR Family Communication:   [name] Hodgkins, MD  [facility] Direct contact: see [email or web address] (further directions at bottom of note if needed) 7PM-7AM contact night coverage as at bottom of note 05/12/2020, 10:04 AM  LOS: 6 days   Significant Hospital Events   . 9/13 admit for CHF   Consults:  . AHF   Procedures:  . PICC  Significant Diagnostic Tests:  Marland Kitchen    Micro Data:  .    Antimicrobials:  .   Interval History/Subjective  F/u CHF Feels ok, c/o bad taste in mouth Eating ok Breathing ok No pain  Objective   Vitals:  Vitals:   05/12/20 0422 05/12/20 0720  BP: (!) 84/56 92/66  Pulse:  79  Resp:  19  Temp:  (!) 97.4 F (36.3 C)  SpO2:  93%    Exam: Physical Exam Constitutional:      General: He is not in acute distress.    Appearance: Normal appearance. He is not ill-appearing.  HENT:     Mouth/Throat:     Mouth: Mucous membranes are moist.     Comments: Minimal white exudate buccal mucose; tongue appears red but no exudate Cardiovascular:     Rate and Rhythm: Regular rhythm. Tachycardia present.     Heart sounds: Murmur heard.  No friction rub. No gallop.      Comments: Telemetry w/ NSVT Holosystolic  murmur RUSB Pulmonary:     Effort: Pulmonary effort is normal. No respiratory distress.     Breath sounds: Normal breath sounds. No wheezing, rhonchi or rales.  Neurological:     Mental Status: He is alert.  Psychiatric:        Mood and Affect: Mood normal.        Behavior: Behavior normal.     I have personally reviewed the following:   Today's Data  . Creatinine 2.23 > 2.53 > 2.57 > 2.17 > 1.89 better . BUN down to 74 . Mg WNL, K+ 4.4 . Tbili  2.3 > 2.1 > 2.1  Scheduled Meds: . aspirin EC  81 mg Oral Daily  . Chlorhexidine Gluconate Cloth  6 each Topical Daily  . enoxaparin  (LOVENOX) injection  30 mg Subcutaneous Q24H  . ezetimibe  10 mg Oral Daily   And  . simvastatin  20 mg Oral q1800  . mometasone-formoterol  2 puff Inhalation BID  . nystatin  5 mL Oral QID  . omega-3 acid ethyl esters  1 g Oral Daily  . senna-docusate  2 tablet Oral BID  . sodium chloride flush  10-40 mL Intracatheter Q12H  . sodium chloride flush  3 mL Intravenous Q12H   Continuous Infusions: . sodium chloride    . milrinone 0.375 mcg/kg/min (05/12/20 0810)    Principal Problem:   Acute systolic CHF (congestive heart failure) (HCC) Active Problems:   Aortic stenosis, severe   AKI (acute kidney injury) (HCC)   Abnormal heart rhythm   Oral candidiasis   HYPERTENSION, BENIGN   CAD, NATIVE VESSEL   Elevated troponin   Weakness   Fall   COPD (chronic obstructive pulmonary disease) (HCC)   Pressure injury of skin   Elevated LFTs   Goals of care, counseling/discussion   Palliative care by specialist   Grief reaction   LOS: 6 days   How to contact the [facility] Attending or Consulting provider [place] or covering provider during after hours [place], for this patient?  1. Check the care team in [facility] and look for a) attending/consulting TRH provider listed and b) the [facility] team listed 2. Log into [email or web address] and use [place]'s universal password to access. If you do not have the password, please contact the hospital operator. 3. Locate the [facility] provider you are looking for under [facility] and page to a number that you can be directly reached. 4. If you still have difficulty reaching the provider, please page the [facility] (Director on Call) for the Hospitalists listed on amion for assistance.

## 2020-05-12 NOTE — Progress Notes (Signed)
Pt BP lower than what it was previously during the shift and pt seems to be more lethargic. Pt BP currently 88/77 MAP of 83 and 84/56 MAP 66. Pt BP usually ranges 90-100's/60's. RN paged MD on call. No new orders given. MD stated to call if MAP is lower than 60. Will continue to monitor.

## 2020-05-13 ENCOUNTER — Encounter (HOSPITAL_COMMUNITY): Payer: Self-pay | Admitting: Internal Medicine

## 2020-05-13 ENCOUNTER — Inpatient Hospital Stay (HOSPITAL_COMMUNITY): Payer: Medicare Other

## 2020-05-13 DIAGNOSIS — I4891 Unspecified atrial fibrillation: Secondary | ICD-10-CM

## 2020-05-13 HISTORY — DX: Unspecified atrial fibrillation: I48.91

## 2020-05-13 LAB — BASIC METABOLIC PANEL
Anion gap: 13 (ref 5–15)
BUN: 73 mg/dL — ABNORMAL HIGH (ref 8–23)
CO2: 21 mmol/L — ABNORMAL LOW (ref 22–32)
Calcium: 8 mg/dL — ABNORMAL LOW (ref 8.9–10.3)
Chloride: 97 mmol/L — ABNORMAL LOW (ref 98–111)
Creatinine, Ser: 2.15 mg/dL — ABNORMAL HIGH (ref 0.61–1.24)
GFR calc Af Amer: 31 mL/min — ABNORMAL LOW (ref >60)
GFR calc non Af Amer: 27 mL/min — ABNORMAL LOW (ref >60)
Glucose, Bld: 99 mg/dL (ref 70–99)
Potassium: 4.4 mmol/L (ref 3.5–5.1)
Sodium: 131 mmol/L — ABNORMAL LOW (ref 135–145)

## 2020-05-13 LAB — CBC
HCT: 40.5 % (ref 39.0–52.0)
HCT: 40 % (ref 39.0–52.0)
Hemoglobin: 12.9 g/dL — ABNORMAL LOW (ref 13.0–17.0)
Hemoglobin: 12.7 g/dL — ABNORMAL LOW (ref 13.0–17.0)
MCH: 28.1 pg (ref 26.0–34.0)
MCH: 27.7 pg (ref 26.0–34.0)
MCHC: 31.9 g/dL (ref 30.0–36.0)
MCHC: 31.8 g/dL (ref 30.0–36.0)
MCV: 88.2 fL (ref 80.0–100.0)
MCV: 87.3 fL (ref 80.0–100.0)
Platelets: 216 10*3/uL (ref 150–400)
Platelets: 219 10*3/uL (ref 150–400)
RBC: 4.59 MIL/uL (ref 4.22–5.81)
RBC: 4.58 MIL/uL (ref 4.22–5.81)
RDW: 15 % (ref 11.5–15.5)
RDW: 14.9 % (ref 11.5–15.5)
WBC: 11.8 10*3/uL — ABNORMAL HIGH (ref 4.0–10.5)
WBC: 11.4 10*3/uL — ABNORMAL HIGH (ref 4.0–10.5)
nRBC: 0 % (ref 0.0–0.2)
nRBC: 0 % (ref 0.0–0.2)

## 2020-05-13 LAB — COOXEMETRY PANEL
Carboxyhemoglobin: 1.8 % — ABNORMAL HIGH (ref 0.5–1.5)
Methemoglobin: 0.9 % (ref 0.0–1.5)
O2 Saturation: 58 %
Total hemoglobin: 14.2 g/dL (ref 12.0–16.0)

## 2020-05-13 LAB — HEPARIN LEVEL (UNFRACTIONATED): Heparin Unfractionated: 0.36 IU/mL (ref 0.30–0.70)

## 2020-05-13 LAB — GLUCOSE, CAPILLARY: Glucose-Capillary: 162 mg/dL — ABNORMAL HIGH (ref 70–99)

## 2020-05-13 MED ORDER — SODIUM CHLORIDE 0.9 % IV SOLN: 250.0000 mL | INTRAVENOUS | Status: DC

## 2020-05-13 MED ORDER — AMIODARONE HCL IN DEXTROSE 360-4.14 MG/200ML-% IV SOLN: 30.0000 mg/h | INTRAVENOUS | Status: DC

## 2020-05-13 MED ORDER — NOREPINEPHRINE 4 MG/250ML-% IV SOLN
2.0000 ug/min | INTRAVENOUS | Status: DC
  Administered 2020-05-13: 2 ug/min via INTRAVENOUS
  Filled 2020-05-13: qty 250

## 2020-05-13 MED ORDER — MIDODRINE HCL 5 MG PO TABS: 2.5000 mg | ORAL_TABLET | Freq: Three times a day (TID) | ORAL | Status: DC

## 2020-05-13 MED ORDER — AMIODARONE HCL IN DEXTROSE 360-4.14 MG/200ML-% IV SOLN
60.0000 mg/h | INTRAVENOUS | Status: DC
  Administered 2020-05-13: 60 mg/h via INTRAVENOUS
  Filled 2020-05-13: qty 200

## 2020-05-13 MED ORDER — MIDODRINE HCL 5 MG PO TABS
10.0000 mg | ORAL_TABLET | Freq: Three times a day (TID) | ORAL | Status: DC
  Administered 2020-05-13 – 2020-05-14 (×2): 10 mg via ORAL
  Filled 2020-05-13 (×2): qty 2

## 2020-05-13 MED ORDER — NOREPINEPHRINE 4 MG/250ML-% IV SOLN
2.0000 ug/min | INTRAVENOUS | Status: DC
  Filled 2020-05-13: qty 250

## 2020-05-13 MED ORDER — SODIUM CHLORIDE 0.9 % IV BOLUS
250.0000 mL | Freq: Once | INTRAVENOUS | Status: AC
  Administered 2020-05-13: 250 mL via INTRAVENOUS

## 2020-05-13 MED ORDER — MIDODRINE HCL 5 MG PO TABS
5.0000 mg | ORAL_TABLET | Freq: Three times a day (TID) | ORAL | Status: DC
  Administered 2020-05-13 (×2): 5 mg via ORAL
  Filled 2020-05-13 (×2): qty 1

## 2020-05-13 MED ORDER — HEPARIN (PORCINE) 25000 UT/250ML-% IV SOLN
1050.0000 [IU]/h | INTRAVENOUS | Status: DC
  Administered 2020-05-13: 900 [IU]/h via INTRAVENOUS
  Administered 2020-05-14: 950 [IU]/h via INTRAVENOUS
  Administered 2020-05-15: 1050 [IU]/h via INTRAVENOUS
  Filled 2020-05-13 (×3): qty 250

## 2020-05-13 MED ORDER — ALUM & MAG HYDROXIDE-SIMETH 200-200-20 MG/5ML PO SUSP
15.0000 mL | Freq: Four times a day (QID) | ORAL | Status: DC | PRN
  Administered 2020-05-13: 15 mL via ORAL
  Filled 2020-05-13: qty 30

## 2020-05-13 NOTE — Significant Event (Signed)
CTSP by bedside RN, RR for hypotension  Per RN pt dropped SBP to 70s after starting amiodarone this AM for afib. Pt asymptomatic and vitals otherwise stable.  S: no pain, breathing better, no complaints O: SBP 74; HR 74, RR 19 Physical Exam Constitutional:      General: He is not in acute distress.    Appearance: He is not toxic-appearing.     Comments: Appears weak, sleepy but wakes up and follows commands  Cardiovascular:     Rate and Rhythm: Normal rate. Rhythm irregular.  Pulmonary:     Effort: Pulmonary effort is normal.     Breath sounds: Normal breath sounds. No wheezing, rhonchi or rales.  Musculoskeletal:     Comments: Moves all extremities to command  Neurological:     Mental Status: He is alert.  Psychiatric:        Mood and Affect: Mood normal.        Behavior: Behavior normal.     Comments: Follows commands, recognizes me and sister   appears to be perfusing well  Amio stopped Give NS bolus x250 RR d/w Dr Aundra Dubin w/ plan for vasopressor and assess response  A/P Hypotension after amiodarone infusion, complicated by acute systolic CHF low oupt on milrinone, severe aortic stenosis, afib. No s/s of sepsis. Mentating and following commands. Appears to be ADR from [facility] or cardiogenic. Plan deferred to Dr. Aundra Dubin.  Murray Hodgkins, MD [facility]

## 2020-05-13 NOTE — Progress Notes (Signed)
PROGRESS NOTE  [name] [name] PTW:[id number] DOB: 1932/03/18 DOA: 05/05/2020 PCP: Burnard Bunting, MD  Brief History   81yom w/ CAD, aortic stenosis, presented w/ SOB, hypotensive, found to LVEF <20% w/ severe AS. Transferred to [facility] to see AHF team and remains on milrinone. Other issues: AKI stable; may have element of CKD, thrush nearly resolved. Continue management for acute cardiac issues as per cadiology. TAVR also may be considered.   A & P   AKI (acute kidney injury) ([place]) --vs CKD, no previous labs but concern for cardiorenal syndrome. --secondary to acute CHF --UOP 900, -1.1 L since admit (no change) --creatinine slightly higher after Lasix yesterday  --management deferred to cardiology  Elevated LFTs --secondary to CHF congestive hepatopathy --transaminases have normalized --Tbili stable just over 2 --monitor   Aortic stenosis, severe --continue as per cardiology, TAVR being considered   COPD (chronic obstructive pulmonary disease) (HCC) --stable, continuenebs and Dulera  Elevated troponin --probably secondary to acute CHF. Management per cardiology  Grief reaction --from death of wife recently in MVA --appears stable, will continue hydroxyzine nightly as needed for insomnia as per psychiatry  Acute systolic CHF (congestive heart failure) ([place]) --continue as per cardiology including milrinone,  statin  Abnormal heart rhythm --deferred to cardiology --SR w/ episodes of NSVT --K+ at goal and MG WNL last 2 checks --continue BMP in AM  Oral candidiasis --nearly resolved, will continue Nystatin  Unspecified atrial fibrillation ([place]) --new dx --management per cardiology (started heparin and amiodarone)   Disposition Plan:  Discussion: continue management as per cardiology, no new issues  Status is: Inpatient  Remains inpatient appropriate because:IV treatments appropriate due to intensity of illness or inability to take PO and Inpatient level of care  appropriate due to severity of illness   Dispo: The patient is from: Home              Anticipated d/c is to: TBD              Anticipated d/c date is: > 3 days              Patient currently is not medically stable to d/c.  DVT prophylaxis:  Place TED hose Start: 05/09/20 0920 Code Status: DNR Family Communication:   Murray Hodgkins, MD  [facility] Direct contact: see [email or web address] (further directions at bottom of note if needed) 7PM-7AM contact night coverage as at bottom of note 05/13/2020, 1:08 PM  LOS: 7 days   Significant Hospital Events   . 9/13 admit for CHF   Consults:  . AHF   Procedures:  . PICC  Significant Diagnostic Tests:  Marland Kitchen    Micro Data:  .    Antimicrobials:  .   Interval History/Subjective  F/u CHF Feels poorly; fatigued Breathing ok, no CP  Objective   Vitals:  Vitals:   05/13/20 0705 05/13/20 1225  BP: 93/61 (!) 72/47  Pulse: 81 77  Resp: 20 (!) 24  Temp: 98.3 F (36.8 C)   SpO2: 95%     Exam: Physical Exam Constitutional:      General: He is not in acute distress.    Appearance: He is not ill-appearing.     Comments: Appears weak, tired  HENT:     Mouth/Throat:     Pharynx: No oropharyngeal exudate.     Comments: Minimal white exudate on tongue; buccal mucosa appears clear Cardiovascular:     Rate and Rhythm: Normal rate and regular rhythm.     Heart sounds: Murmur  heard.  No friction rub. No gallop.      Comments: Telemetry w/ NSVT Holosystolic murmur RUSB Pulmonary:     Effort: Pulmonary effort is normal. No respiratory distress.     Breath sounds: Normal breath sounds. No wheezing, rhonchi or rales.  Neurological:     Mental Status: He is alert.  Psychiatric:        Mood and Affect: Mood normal.        Behavior: Behavior normal.     I have personally reviewed the following:   Today's Data  . Creatinine 2.17 > 1.89 > 21.5 . Tbili  2.3 > 2.1 > 2.1 > none today . CBC unremarkable  Scheduled Meds: .  aspirin EC  81 mg Oral Daily  . Chlorhexidine Gluconate Cloth  6 each Topical Daily  . ezetimibe  10 mg Oral Daily   And  . simvastatin  20 mg Oral q1800  . midodrine  5 mg Oral TID WC  . mometasone-formoterol  2 puff Inhalation BID  . nystatin  5 mL Oral QID  . omega-3 acid ethyl esters  1 g Oral Daily  . senna-docusate  2 tablet Oral BID  . sodium chloride flush  10-40 mL Intracatheter Q12H  . sodium chloride flush  3 mL Intravenous Q12H   Continuous Infusions: . sodium chloride    . amiodarone Stopped (05/13/20 1233)  . amiodarone    . heparin 900 Units/hr (05/13/20 0919)  . milrinone 0.375 mcg/kg/min (05/13/20 0631)    Principal Problem:   Acute systolic CHF (congestive heart failure) (HCC) Active Problems:   Aortic stenosis, severe   AKI (acute kidney injury) ([place])   Abnormal heart rhythm   Unspecified atrial fibrillation (HCC)   Oral candidiasis   HYPERTENSION, BENIGN   CAD, NATIVE VESSEL   Elevated troponin   Weakness   Fall   COPD (chronic obstructive pulmonary disease) (HCC)   Pressure injury of skin   Elevated LFTs   Goals of care, counseling/discussion   Palliative care by specialist   Grief reaction   LOS: 7 days   How to contact the [facility] Attending or Consulting provider [place] or covering provider during after hours [place], for this patient?  1. Check the care team in [facility] and look for a) attending/consulting TRH provider listed and b) the [facility] team listed 2. Log into [email or web address] and use [place]'s universal password to access. If you do not have the password, please contact the hospital operator. 3. Locate the [facility] provider you are looking for under [facility] and page to a number that you can be directly reached. 4. If you still have difficulty reaching the provider, please page the [facility] (Director on Call) for the Hospitalists listed on amion for assistance.

## 2020-05-13 NOTE — Progress Notes (Signed)
This chaplain responded to [facility] page for spiritual care.  The chaplain learned from the Pt. "How Great Thou Art" is the song a pastor outside the hospital was singing to the Pt.  The chaplain played the Pt. choice from You Tube.  The Pt describes himself as experiencing nausea from taking all his medicine at one time this morning. The chaplain updated the Pt. RN-Ben.  The chaplain will F/U with the Pt. AD at another time.

## 2020-05-13 NOTE — Significant Event (Signed)
Rapid Response Event Note   Reason for Call :  Hypotension- 67/50  Initial Focused Assessment:  Pt had been up to chair for approximately 2 hours when he became hypotensive. He remains alert & oriented. He follows commands and moves all extremities appropriately. Skin is pink. Extremities are cool. Pt denies pain, lightheadedness, dizziness. He endorses indigestion.   VS: T 96.31F, BP 67/50 (70/40 manually), HR 72, RR 28, SpO2 93% on room air CBG: 162  Interventions:  -Amio gtt stopped, MD notified -EKG -placed on 5LNC to maintain oxygen saturation >92% -250 cc bolus  Plan of Care:  -Levophed gtt at 2 mcg -q15 minute VS while on Levophed gtt and 1 hour post gtt discontinuation -Notify provider if unable to wean gtt down or titrate off within 2-3 hours  Call rapid response for additional needs.   Event Summary:  MD Notified: Dr. Sarajane Jews, Dr. Aundra Dubin  Call Time: 1223 Arrival Time: 1225 End Time:   Casimer Bilis, RN

## 2020-05-13 NOTE — Progress Notes (Signed)
Interval Progress Note   Was called to bedside for symptomatic hypotension. BP 74/48. Dizzy.  Was started on IV amio earlier today.  Given 250 cc bolus of IVF w/o response.   Repeat BP on my exam w/ SBP 79. Cuff MAP 58.  In AFib w/ RVR in the 70s Drowsy and fatigue looking.  CVP 10-12   Stop IV amio Can start low dose NE for BP support, 2 mcg/kg/min.  Continue on midodrine 5 tid (started today) Check STAT CBC  Will monitor closely   RN notified of plan.   Lyda Jester, PA-C 05/13/2020

## 2020-05-13 NOTE — Assessment & Plan Note (Addendum)
--  new dx --management per cardiology  --dropped BP on amio

## 2020-05-13 NOTE — Progress Notes (Signed)
ANTICOAGULATION CONSULT NOTE - Initial Consult  Pharmacy Consult for IV heparin Indication: atrial fibrillation  No Known Allergies  Patient Measurements: Height: 5\' 10"  (177.8 cm) Weight: 76 kg (167 lb 8.8 oz) IBW/kg (Calculated) : 73 Heparin Dosing Weight: 76 kg Vital Signs: Temp: 98.3 F (36.8 C) (09/20 0705) Temp Source: Axillary (09/20 0705) BP: 93/61 (09/20 0705) Pulse Rate: 81 (09/20 0705)  Labs: Recent Labs    05/11/20 0421 05/12/20 0443 05/13/20 0407  CREATININE 2.17* 1.89* 2.15*    Estimated Creatinine Clearance: 25.5 mL/min (A) (by C-G formula based on SCr of 2.15 mg/dL (H)).   Medical History: Past Medical History:  Diagnosis Date  . CAD, NATIVE VESSEL    a. stenting of the RCA in 06/2005 with a bare-metal stent with residual 50-70% narrowing in the left anterior descending coronary artery. b. nonischemic nuc 2007.  . Diverticulosis   . Hemorrhoids   . History of colon polyps   . HYPERLIPIDEMIA-MIXED   . HYPERTENSION, BENIGN   . Lumbar stenosis   . Moderate aortic stenosis   . PROSTATE CANCER, HX OF   . Skin cancer    face, neck  . Tubular adenoma     Medications:  Infusions:  . sodium chloride    . amiodarone    . amiodarone    . milrinone 0.375 mcg/kg/min (05/13/20 0631)    Assessment: 86 yo male with new development of afib.  Pharmacy asked to begin anticoagulation with IV heparin.  Has been receiving Lovenox for DVT prophylaxis, last dose 9/19.    Last CBC 9/17, was WNL at that time.  No known recent bleeding complications.  Goal of Therapy:  Heparin level 0.3-0.7 units/ml Monitor platelets by anticoagulation protocol: Yes   Plan:  1. Start IV heparin 900 units/hr. 2. Check heparin level 8 hrs after gtt starts. 3. Daily heparin level, and CBC. 4. F/u plans for oral anticoagulation eventually.  [place] Crane, Roylene Reason, BCCP Clinical Pharmacist  05/13/2020 8:56 AM   [facility] pharmacy phone numbers are listed on [email or web address]

## 2020-05-13 NOTE — Progress Notes (Signed)
Pt. resting comfortably at time of chaplain visit to complete AD. Pt. sister-Jane and the chaplain decided to try again on Tuesday morning at 10am.

## 2020-05-13 NOTE — Progress Notes (Addendum)
Patient ID: [name] [name], male   DOB: [birth year], 86 y.o.   MRN: [id number]     Advanced Heart Failure Rounding Note  PCP-Cardiologist: Jenkins Rouge, MD   Subjective:    On milrinone 0.375. CO-OX 58%.   Yesterday received 80 mg IV lasix x1.  CVP:  [5 mmHg-13 mmHg] 8 mmHg   Lab Results  Component Value Date   CREATININE 2.15 (H) 05/13/2020   CREATININE 1.89 (H) 05/12/2020   CREATININE 2.17 (H) 05/11/2020    Complaining of cough.   Objective:   Weight Range: 76 kg Body mass index is 24.04 kg/m.   Vital Signs:   Temp:  [97.4 F (36.3 C)-98.4 F (36.9 C)] 98.3 F (36.8 C) (09/20 0705) Pulse Rate:  [78-90] 81 (09/20 0705) Resp:  [14-20] 20 (09/20 0705) BP: (81-102)/(57-88) 93/61 (09/20 0705) SpO2:  [90 %-95 %] 95 % (09/20 0705) Weight:  [76 kg] 76 kg (09/20 0316) Last BM Date: 05/07/20  Weight change: Filed Weights   05/11/20 0326 05/12/20 0300 05/13/20 0316  Weight: 76.7 kg 76 kg 76 kg    Intake/Output:   Intake/Output Summary (Last 24 hours) at 05/13/2020 0716 Last data filed at 05/13/2020 0400 Gross per 24 hour  Intake 825.6 ml  Output 900 ml  Net -74.4 ml      Physical Exam   CVP 8  General: No resp difficulty HEENT: normal Neck: supple. JVP 7-8 . Carotids 2+ bilat; no bruits. No lymphadenopathy or thryomegaly appreciated. Cor: PMI nondisplaced. Irregular rate & rhythm. No rubs, gallops. 2/6 AS . Lungs: Crackles half way up.  Abdomen: soft, nontender, nondistended. No hepatosplenomegaly. No bruits or masses. Good bowel sounds. Extremities: no cyanosis, clubbing, rash, edema Neuro: alert & orientedx3, cranial nerves grossly intact. moves all 4 extremities w/o difficulty. Affect pleasant   Telemetry  NSR ? A fib 70s    Labs    CBC No results for input(s): WBC, NEUTROABS, HGB, HCT, MCV, PLT in the last 72 hours. Basic Metabolic Panel Recent Labs    05/11/20 0421 05/11/20 1447 05/12/20 0443 05/13/20 0407  NA   < >  --  131* 131*  K   < >   --  4.4 4.4  CL   < >  --  98 97*  CO2   < >  --  24 21*  GLUCOSE   < >  --  102* 99  BUN   < >  --  74* 73*  CREATININE   < >  --  1.89* 2.15*  CALCIUM   < >  --  8.0* 8.0*  MG  --  2.4 2.4  --    < > = values in this interval not displayed.   Liver Function Tests Recent Labs    05/12/20 0443  AST 37  ALT 34  ALKPHOS 98  BILITOT 2.1*  PROT 5.3*  ALBUMIN 2.3*   No results for input(s): LIPASE, AMYLASE in the last 72 hours. Cardiac Enzymes No results for input(s): CKTOTAL, CKMB, CKMBINDEX, TROPONINI in the last 72 hours.  BNP: BNP (last 3 results) Recent Labs    05/01/2020 1101  BNP 3,817.0*    ProBNP (last 3 results) No results for input(s): PROBNP in the last 8760 hours.   D-Dimer No results for input(s): DDIMER in the last 72 hours. Hemoglobin A1C No results for input(s): HGBA1C in the last 72 hours. Fasting Lipid Panel No results for input(s): CHOL, HDL, LDLCALC, TRIG, CHOLHDL, LDLDIRECT in the last 72  hours. Thyroid Function Tests No results for input(s): TSH, T4TOTAL, T3FREE, THYROIDAB in the last 72 hours.  Invalid input(s): FREET3  Other results:   Imaging    No results found.   Medications:     Scheduled Medications: . aspirin EC  81 mg Oral Daily  . Chlorhexidine Gluconate Cloth  6 each Topical Daily  . enoxaparin (LOVENOX) injection  30 mg Subcutaneous Q24H  . ezetimibe  10 mg Oral Daily   And  . simvastatin  20 mg Oral q1800  . mometasone-formoterol  2 puff Inhalation BID  . nystatin  5 mL Oral QID  . omega-3 acid ethyl esters  1 g Oral Daily  . senna-docusate  2 tablet Oral BID  . sodium chloride flush  10-40 mL Intracatheter Q12H  . sodium chloride flush  3 mL Intravenous Q12H    Infusions: . sodium chloride    . milrinone 0.375 mcg/kg/min (05/13/20 0631)    PRN Medications: sodium chloride, acetaminophen **OR** acetaminophen, bisacodyl, hydrocortisone, hydrOXYzine, ipratropium-albuterol, nitroGLYCERIN, ondansetron **OR**  ondansetron (ZOFRAN) IV, sodium chloride flush, sodium chloride flush, witch hazel-glycerin  Assessment/Plan   1. Acute on chronic systolic CHF: Echo this admission with new EF < 20%, normal RV, suspect low flow/low gradient severe AS. Prior echo in 2019 with severe AS, EF 50-55%. No chest pain, just dyspnea. There is concern that this may be the natural history over the last 2 years of severe AS.  It appears that he was lost to followup after 2019 echo. He has a history of CAD, so cannot rule out a component of ischemic cardiomyopathy.  He did not present with ACS.  -On milrinone 0.375. CO-OX 58%.   - CVP 7-8. Volume status ok. No IV lasix today. SBP in 80-90s. Add midodrine 2.5 mg three times a day.  - Will need RHC/LHC if his creatinine improves.  - Ultimately, would have TAVR.  However, we remain concerned that we may not be able to get him there (renal dysfunction, frailty).  - Cardiac amyloidosis is certainly a concern with AS, conduction system disease.  Eventual cMRI if he stabilizes.  2. Aortic stenosis: Low flow/low gradient severe AS.  In 2019, echo showed severe AS.  As above, we are concerned that EF is low primarily due to untreated AS.  His only option at this point would be TAVR, but he will need considerable improvement prior and it is not clear that he will make it to TAVR given frailty and renal dysfunction.  - continue to follow. Structural team aware 3. AKI: Creatinine improving with milrinone  2.57 -> 2.17 -> 1.89->2.15 with BUN 100 -> 84 -> 74->73.  (Creatinine 1.3 in 2016) - Renal US - no hydronephrosis.  - Continue milrinone.  -Hold diuretics today.  4. CAD: PCI in 2006.  Presentation not consistent with ACS, no chest pain.  HS-TnI mildly elevated with no trend.   - No chest pain. - Continue ASA, statin.  - Ideally would have cath but need improvement in creatinine.  5. Rhythm: Has been markedly abnormal.  Baseline NSR with long 1st degree AVB.  He appears to have  alternating bundle branch blocks.  He also appears to have Wenckebach periodicity at times.  Significant conduction system disease, if he is able to make it to TAVR, I suspect he will need a pacemaker.  - remains in NSR with wenckebach at times. No change today 6. Deconditioning: Mobilize with cardiac rehab, PT.  7. Hypokalemia - Resolved.  8. Deconditioning -  PT/OT 9. Jerome DNR . Palliative Care following.  10. Cough CVP ok at 8.  - Check CXR. Afebrile.    Length of Stay: 7  Amy Clegg, NP  05/13/2020, 7:16 AM  Advanced Heart Failure Team Pager [phone] (M-F; [place])  Please contact [facility] for night-coverage after hours (4p -7a ) and weekends on [email or web address]  Patient seen with NP, agree with the above note.   CVP 7-8 today with co-ox 58% on milrinone 0.375.  BUN trending down but creatinine higher at 2.15.  He has gone into atrial fibrillation over the last day with rate in 90s.  SBP remains in 90s.    Still weak but has been up to chair.   General: NAD Neck: JVP 7-8 cm, no thyromegaly or thyroid nodule.  Lungs: Clear to auscultation bilaterally with normal respiratory effort. CV: Nondisplaced PMI.  Heart irregular S1/S2, no S3/S4, 2/6 SEM RUSB.  1+ ankle edema.   Abdomen: Soft, nontender, no hepatosplenomegaly, no distention.  Skin: Intact without lesions or rashes.  Neurologic: Alert and oriented x 3.  Psych: Normal affect. Extremities: No clubbing or cyanosis.  HEENT: Normal.   1. Acute on chronic systolic CHF: Echo this admission with new EF <20%, normal RV, suspect low flow/low gradient severe AS. Prior echo in 2019 with severe AS, EF 50-55%. No chest pain, just dyspnea. I am concerned that this may be the natural history over the last 2 years of severe AS. It appears that he was lost to followup after 2019 echo. He has a history of CAD, so cannot rule out a component of ischemic cardiomyopathy. He did not present with ACS. On exam, he is not volume overloaded  and CVP is 7-8 today.  Elevated BUN and creatinine consistent with cardiorenal syndrome, BUN trending down but creatinine mildly higher at 2.15 (got IV Lasix yesterday). Co-ox 58% on milrinone gtt.  - Continue milrinone 0.375 mcg/kg/min.  - Hold diuretics today. - Add midodrine 5 mg tid.   - Will need RHC/LHC, will aim for Tuesday or Wednesday if renal indices improve.   - Ultimately, would ideally have TAVR. However, I am concerned that we may not be able to get him there (renal dysfunction, frailty).  He understands the potential limitations but wants to continue to work in that direction.  - Cardiac amyloidosis is certainly a concern with AS, conduction system disease. Eventual cMRI if he stabilizes.  2. Aortic stenosis: Low flow/low gradient severe AS. In 2019, echo showed severe AS. As above, I am concerned that EF is low primarily due to untreated AS. His only option at this point would be TAVR, but he will need considerable improvement prior and it is not clear that he will make it to TAVR given frailty and renal dysfunction.  He understands our concerns but would like TAVR if it is thought to be feasible.  3. AKI: Creatinine up to 2.57 with BUN 100 at admission. Suspect cardiorenal syndrome.  He has improved on milrinone, BUN coming down.  Creatinine 2.15 today.  - Continue milrinone.  - Hold diuretics.   4. CAD: PCI in 2006. Presentation not consistent with ACS, no chest pain. HS-TnI mildly elevated with no trend.  - Continue ASA, statin.  - Ideally would have cath but need improvement in creatinine.  5. AV block: Rhythm has been markedly abnormal. Baseline NSR with long 1st degree AVB. He appears to have alternating bundle branch blocks. He also appears to have Wenckebach periodicity at times. Significant  conduction system disease, if he is able to make it to TAVR, I suspect he will need a pacemaker.  He is now in atrial fibrillation, see below.  6. Atrial fibrillation: Now  persistent, appears to have started yesterday.  HR in 90s.  - Will add heparin gtt for anticoagulation.  - Add amiodarone gtt given recent onset to see if he can be converted.   7. Deconditioning: Mobilize with cardiac rehab, PT.  Needs to get out of bed today.   Loralie Champagne 05/13/2020 8:35 AM

## 2020-05-13 NOTE — NC FL2 (Signed)
[place] MEDICAID FL2 LEVEL OF CARE SCREENING TOOL     IDENTIFICATION  Patient Name: [name] [name] Birthdate: 28-Apr-1932 Sex: male Admission Date (Current Location): 05/04/2020  [facility] and [place] Number:  Herbalist and Address:  [facility], [place] [address], [place], [place] [address]      Provider Number: [id number]  Attending Physician Name and Address:  Samuella Cota, MD  Relative Name and Phone Number:       Current Level of Care: Hospital Recommended Level of Care: [place] Prior Approval Number:    Date Approved/Denied:   PASRR Number: [id number]  Discharge Plan: SNF    Current Diagnoses: Patient Active Problem List   Diagnosis Date Noted  . Unspecified atrial fibrillation ([place]) 05/13/2020  . Oral candidiasis 05/10/2020  . AKI (acute kidney injury) ([place]) 05/09/2020  . Elevated LFTs 05/09/2020  . Grief reaction 05/09/2020  . Acute systolic CHF (congestive heart failure) ([place]) 05/09/2020  . Abnormal heart rhythm 05/09/2020  . Goals of care, counseling/discussion   . Palliative care by specialist   . Pressure injury of skin 05/07/2020  . Elevated troponin 04/30/2020  . Aortic stenosis, severe 04/29/2020  . Weakness 05/05/2020  . Fall 04/29/2020  . COPD (chronic obstructive pulmonary disease) ([place]) 04/26/2020  . Mild aortic stenosis 07/30/2014  . First degree atrioventricular block 08/09/2012  . BLOOD IN STOOL 10/16/2010  . PROSTATE CANCER, HX OF 10/16/2010  . Elevated lipids 05/19/2009  . HYPERTENSION, BENIGN 05/19/2009  . CAD, NATIVE VESSEL 05/19/2009    Orientation RESPIRATION BLADDER Height & Weight     Self, Time, Situation, Place  O2 (4L Nasal Cannula) Continent, External catheter Weight: 76 kg Height:  5\' 10"  (177.8 cm)  BEHAVIORAL SYMPTOMS/MOOD NEUROLOGICAL BOWEL NUTRITION STATUS      Continent Diet  AMBULATORY STATUS COMMUNICATION OF NEEDS Skin   Limited Assist Verbally PU Stage and  Appropriate Care (Stage II on sacrum)                       Personal Care Assistance Level of Assistance  Bathing, Feeding, Dressing Bathing Assistance: Maximum assistance Feeding assistance: Independent Dressing Assistance: Limited assistance     Functional Limitations Info  Sight, Hearing Sight Info: Impaired Hearing Info: Impaired      SPECIAL CARE FACTORS FREQUENCY  PT (By licensed PT), OT (By licensed OT)     PT Frequency: 5x week OT Frequency: 5x week            Contractures Contractures Info: Not present    Additional Factors Info  Code Status, Allergies Code Status Info: DNR Allergies Info: NKA           Current Medications (05/13/2020):  This is the current hospital active medication list Current Facility-Administered Medications  Medication Dose Route Frequency Provider Last Rate Last Admin  . 0.9 %  sodium chloride infusion  250 mL Intravenous PRN Nita Sells, MD      . 0.9 %  sodium chloride infusion  250 mL Intravenous Continuous Simmons, Brittainy M, PA-C      . acetaminophen (TYLENOL) tablet 650 mg  650 mg Oral Q6H PRN Nita Sells, MD   650 mg at 05/13/20 0932   Or  . acetaminophen (TYLENOL) suppository 650 mg  650 mg Rectal Q6H PRN Nita Sells, MD      . alum & mag hydroxide-simeth (MAALOX/MYLANTA) 200-200-20 MG/5ML suspension 15 mL  15 mL Oral Q6H PRN Murray Hodgkins  P, MD   15 mL at 05/13/20 1225  . aspirin EC tablet 81 mg  81 mg Oral Daily Nita Sells, MD   81 mg at 05/13/20 0932  . bisacodyl (DULCOLAX) suppository 10 mg  10 mg Rectal Daily PRN [name] A, NP      . Chlorhexidine Gluconate Cloth 2 % PADS 6 each  6 each Topical Daily Florencia Reasons, MD   6 each at 05/13/20 [phone]  . ezetimibe (ZETIA) tablet 10 mg  10 mg Oral Daily Samuella Cota, MD   10 mg at 05/13/20 0932   And  . simvastatin (ZOCOR) tablet 20 mg  20 mg Oral q1800 Samuella Cota, MD   20 mg at 05/12/20 1622  . heparin ADULT infusion  100 units/mL (25000 units/250mL sodium chloride 0.45%)  900 Units/hr Intravenous Continuous Pat Patrick, RPH 9 mL/hr at 05/13/20 0919 900 Units/hr at 05/13/20 0919  . hydrocortisone (ANUSOL-HC) suppository 25 mg  25 mg Rectal BID PRN Samuella Cota, MD      . hydrOXYzine (ATARAX/VISTARIL) tablet 25 mg  25 mg Oral QHS PRN Nita Sells, MD   25 mg at 05/12/20 2109  . ipratropium-albuterol (DUONEB) 0.5-2.5 (3) MG/3ML nebulizer solution 3 mL  3 mL Nebulization Q6H PRN Nita Sells, MD      . midodrine (PROAMATINE) tablet 5 mg  5 mg Oral TID WC Larey Dresser, MD   5 mg at 05/13/20 1222  . milrinone (PRIMACOR) 20 MG/100 ML (0.2 mg/mL) infusion  0.375 mcg/kg/min Intravenous Continuous Larey Dresser, MD 8.61 mL/hr at 05/13/20 0631 0.375 mcg/kg/min at 05/13/20 0631  . mometasone-formoterol (DULERA) 200-5 MCG/ACT inhaler 2 puff  2 puff Inhalation BID Nita Sells, MD   2 puff at 05/12/20 2027  . nitroGLYCERIN (NITROSTAT) SL tablet 0.4 mg  0.4 mg Sublingual Q5 min PRN Nita Sells, MD      . norepinephrine (LEVOPHED) 4mg  in 230mL premix infusion  2 mcg/min Intravenous Continuous Lyda Jester M, PA-C 7.5 mL/hr at 05/13/20 1358 2 mcg/min at 05/13/20 1358  . nystatin (MYCOSTATIN) 100000 UNIT/ML suspension 500,000 Units  5 mL Oral QID Samuella Cota, MD   500,000 Units at 05/13/20 0932  . omega-3 acid ethyl esters (LOVAZA) capsule 1 g  1 g Oral Daily Nita Sells, MD   1 g at 05/13/20 0932  . ondansetron (ZOFRAN) tablet 4 mg  4 mg Oral Q6H PRN Nita Sells, MD       Or  . ondansetron (ZOFRAN) injection 4 mg  4 mg Intravenous Q6H PRN Nita Sells, MD   4 mg at 05/09/20 0825  . senna-docusate (Senokot-S) tablet 2 tablet  2 tablet Oral BID [name] A, NP   2 tablet at 05/13/20 0932  . sodium chloride flush (NS) 0.9 % injection 10-40 mL  10-40 mL Intracatheter Q12H Florencia Reasons, MD   10 mL at 05/13/20 0933  . sodium chloride flush (NS) 0.9  % injection 10-40 mL  10-40 mL Intracatheter PRN Florencia Reasons, MD      . sodium chloride flush (NS) 0.9 % injection 3 mL  3 mL Intravenous Q12H Nita Sells, MD   3 mL at 05/12/20 0906  . sodium chloride flush (NS) 0.9 % injection 3 mL  3 mL Intravenous PRN Nita Sells, MD   3 mL at 05/10/20 0916  . witch hazel-glycerin (TUCKS) pad   Topical PRN Samuella Cota, MD         Discharge Medications: Please see  discharge summary for a list of discharge medications.  Relevant Imaging Results:  Relevant Lab Results:   Additional Information SSN:  [id number]  Hyman Hopes, RN

## 2020-05-13 NOTE — Progress Notes (Signed)
ANTICOAGULATION CONSULT NOTE - Follow Up Consult  Pharmacy Consult for IV Heparin Indication: atrial fibrillation  No Known Allergies  Patient Measurements: Height: 5\' 10"  (177.8 cm) Weight: 76 kg (167 lb 8.8 oz) IBW/kg (Calculated) : 73 Heparin Dosing Weight: 76 kg  Vital Signs: Temp: 97.9 F (36.6 C) (09/20 1545) Temp Source: Axillary (09/20 1545) BP: 88/59 (09/20 1415) Pulse Rate: 78 (09/20 1415)  Labs: Recent Labs    05/11/20 0421 05/12/20 0443 05/13/20 0407 05/13/20 0920 05/13/20 1545 05/13/20 1800  HGB  --   --   --  12.9* 12.7*  --   HCT  --   --   --  40.5 40.0  --   PLT  --   --   --  216 219  --   HEPARINUNFRC  --   --   --   --   --  0.36  CREATININE 2.17* 1.89* 2.15*  --   --   --     Estimated Creatinine Clearance: 25.5 mL/min (A) (by C-G formula based on SCr of 2.15 mg/dL (H)).   Medical History: Past Medical History:  Diagnosis Date   CAD, NATIVE VESSEL    a. stenting of the RCA in 06/2005 with a bare-metal stent with residual 50-70% narrowing in the left anterior descending coronary artery. b. nonischemic nuc 2007.   Diverticulosis    Hemorrhoids    History of colon polyps    HYPERLIPIDEMIA-MIXED    HYPERTENSION, BENIGN    Lumbar stenosis    Moderate aortic stenosis    PROSTATE CANCER, HX OF    Skin cancer    face, neck   Tubular adenoma    Unspecified atrial fibrillation ([place]) 05/13/2020    Assessment: 86 yr old male with new development of afib.  Pharmacy was asked to begin anticoagulation with IV heparin.  Pt has been receiving Lovenox for DVT prophylaxis, last dose 9/19. Last CBC prior to starting IV heparin (9/17) was WNL; no known recent bleeding complications.  Initial heparin level ~8.5 hrs after starting heparin infusion at 900 units/hr was 0.36 units/ml, which is within the goal range for this pt. H/H this afternoon were 12.7/40.0, platelets 219 (stable from CBC done earlier today). Per RN, no issues with IV or bleeding  observed.  Goal of Therapy:  Heparin level 0.3-0.7 units/ml Monitor platelets by anticoagulation protocol: Yes   Plan:  Continue heparin infusion at 900 units/hr Check confirmatory heparin level in 8 hrs Monitor daily heparin level, CBC Monitor for signs/symptoms of bleeding F/U plan for oral anticoagulation when able  Gillermina Hu, PharmD, BCPS, [facility] Clinical Pharmacist  05/13/2020 7:00 PM

## 2020-05-13 NOTE — TOC Initial Note (Signed)
Transition of Care [facility]) - Initial/Assessment Note    Patient Details  Name: [name] [name] MRN: [id number] Date of Birth: 1932-05-29  Transition of Care [facility]) CM/SW Contact:    [address], [place] Phone Number: 05/13/2020, 4:28 PM  Clinical Narrative:                 CSW received consult for possible SNF placement at time of discharge. CSW spoke with patient and his sister Opal Sidles ([phone]) at bedside regarding PT recommendation of SNF placement at time of discharge. Patient reported that he is currently unable to care for himself at his  home given his current physical needs and fall risk. Patient and his sister expressed understanding of PT recommendation and is agreeable to SNF placement at time of discharge. Patient reports preference for [facility]. CSW discussed insurance authorization process and provided Medicare SNF ratings list. Patient has received the COVID vaccines. Patient expressed being hopeful for rehab and to feel better soon. No further questions reported at this time. CSW to continue to follow and assist with discharge planning needs.  Expected Discharge Plan: Skilled Nursing Facility Barriers to Discharge: Continued Medical Work up, Ship broker   Patient Goals and CMS Choice   CMS Medicare.gov Compare Post Acute Care list provided to:: Patient Represenative (must comment) (Sister, Otelia Limes) Choice offered to / list presented to : Sibling, Patient  Expected Discharge Plan and Services Expected Discharge Plan: [place] In-house Referral: Clinical Social Work     Living arrangements for the past 2 months: Single Family Home                                      Prior Living Arrangements/Services Living arrangements for the past 2 months: Single Family Home Lives with:: Self Patient language and need for interpreter reviewed:: Yes Do you feel safe going back to the place where you live?: No   No one to take care of   Need for Family Participation in Patient Care: Yes (Comment) Care giver support system in place?: No (comment)   Criminal Activity/Legal Involvement Pertinent to Current Situation/Hospitalization: No - Comment as needed  Activities of Daily Living Home Assistive Devices/Equipment: Eyeglasses ADL Screening (condition at time of admission) Patient's cognitive ability adequate to safely complete daily activities?: Yes Is the patient deaf or have difficulty hearing?: Yes ([place]) Does the patient have difficulty seeing, even when wearing glasses/contacts?: No Does the patient have difficulty concentrating, remembering, or making decisions?: No Patient able to express need for assistance with ADLs?: Yes Does the patient have difficulty dressing or bathing?: Yes Independently performs ADLs?: No Communication: Independent Dressing (OT): Needs assistance Is this a change from baseline?: Change from baseline, expected to last >3 days Grooming: Needs assistance Is this a change from baseline?: Change from baseline, expected to last >3 days Feeding: Needs assistance Is this a change from baseline?: Change from baseline, expected to last >3 days Bathing: Needs assistance Is this a change from baseline?: Change from baseline, expected to last >3 days Toileting: Needs assistance Is this a change from baseline?: Change from baseline, expected to last >3days In/Out Bed: Needs assistance Is this a change from baseline?: Change from baseline, expected to last >3 days Walks in Home: Needs assistance Is this a change from baseline?: Change from baseline, expected to last >3 days Does the patient have difficulty walking or climbing stairs?: Yes (secondary  to weakness) Weakness of Legs: Both Weakness of Arms/Hands: None  Permission Sought/Granted Permission sought to share information with : Facility Sport and exercise psychologist, Family Supports, Case Manager Permission granted to share information with :  Yes, Verbal Permission Granted  Share Information with NAME: Otelia Limes [phone]  Permission granted to share info w AGENCY: SNFs  Permission granted to share info w Relationship: Sister  Permission granted to share info w Contact Information: [phone]  Emotional Assessment Appearance:: Appears stated age Attitude/Demeanor/Rapport: Lethargic Affect (typically observed): Sad Orientation: : Oriented to Self, Oriented to Place, Oriented to  Time, Oriented to Situation Alcohol / Substance Use: Not Applicable Psych Involvement: No (comment)  Admission diagnosis:  DOE (dyspnea on exertion) [R06.00] Leg swelling [M79.89] Elevated troponin [R77.8] Elevated brain natriuretic peptide (BNP) level [R79.89] Fatigue, unspecified type [R53.83] Patient Active Problem List   Diagnosis Date Noted  . Unspecified atrial fibrillation ([place]) 05/13/2020  . Oral candidiasis 05/10/2020  . AKI (acute kidney injury) ([place]) 05/09/2020  . Elevated LFTs 05/09/2020  . Grief reaction 05/09/2020  . Acute systolic CHF (congestive heart failure) ([place]) 05/09/2020  . Abnormal heart rhythm 05/09/2020  . Goals of care, counseling/discussion   . Palliative care by specialist   . Pressure injury of skin 05/07/2020  . Elevated troponin 04/30/2020  . Aortic stenosis, severe 04/28/2020  . Weakness 05/14/2020  . Fall 05/17/2020  . COPD (chronic obstructive pulmonary disease) ([place]) 05/05/2020  . Mild aortic stenosis 07/30/2014  . First degree atrioventricular block 08/09/2012  . BLOOD IN STOOL 10/16/2010  . PROSTATE CANCER, HX OF 10/16/2010  . Elevated lipids 05/19/2009  . HYPERTENSION, BENIGN 05/19/2009  . CAD, NATIVE VESSEL 05/19/2009   PCP:  Burnard Bunting, MD Pharmacy:   [facility], [address] [address] Phone: [phone] Fax: [facility], [address] [place] [address] Phone: [phone] Fax: [place], [place] [address] [place] [address] Phone: [phone] Fax: [phone]     Social Determinants of Health (SDOH) Interventions    Readmission Risk Interventions No flowsheet data found.

## 2020-05-14 DIAGNOSIS — N179 Acute kidney failure, unspecified: Secondary | ICD-10-CM | POA: Diagnosis not present

## 2020-05-14 LAB — BASIC METABOLIC PANEL
Anion gap: 10 (ref 5–15)
BUN: 87 mg/dL — ABNORMAL HIGH (ref 8–23)
CO2: 23 mmol/L (ref 22–32)
Calcium: 7.7 mg/dL — ABNORMAL LOW (ref 8.9–10.3)
Chloride: 97 mmol/L — ABNORMAL LOW (ref 98–111)
Creatinine, Ser: 2.43 mg/dL — ABNORMAL HIGH (ref 0.61–1.24)
GFR calc Af Amer: 27 mL/min — ABNORMAL LOW (ref >60)
GFR calc non Af Amer: 23 mL/min — ABNORMAL LOW (ref >60)
Glucose, Bld: 109 mg/dL — ABNORMAL HIGH (ref 70–99)
Potassium: 4.4 mmol/L (ref 3.5–5.1)
Sodium: 130 mmol/L — ABNORMAL LOW (ref 135–145)

## 2020-05-14 LAB — CBC
HCT: 40.4 % (ref 39.0–52.0)
Hemoglobin: 12.8 g/dL — ABNORMAL LOW (ref 13.0–17.0)
MCH: 27.6 pg (ref 26.0–34.0)
MCHC: 31.7 g/dL (ref 30.0–36.0)
MCV: 87.3 fL (ref 80.0–100.0)
Platelets: 249 10*3/uL (ref 150–400)
RBC: 4.63 MIL/uL (ref 4.22–5.81)
RDW: 14.9 % (ref 11.5–15.5)
WBC: 13 10*3/uL — ABNORMAL HIGH (ref 4.0–10.5)
nRBC: 0 % (ref 0.0–0.2)

## 2020-05-14 LAB — COOXEMETRY PANEL
Carboxyhemoglobin: 1.3 % (ref 0.5–1.5)
Methemoglobin: 0.7 % (ref 0.0–1.5)
O2 Saturation: 60.6 %
Total hemoglobin: 13.4 g/dL (ref 12.0–16.0)

## 2020-05-14 LAB — HEPARIN LEVEL (UNFRACTIONATED): Heparin Unfractionated: 0.31 IU/mL (ref 0.30–0.70)

## 2020-05-14 MED ORDER — FUROSEMIDE 10 MG/ML IJ SOLN
60.0000 mg | Freq: Once | INTRAMUSCULAR | Status: AC
  Administered 2020-05-14: 60 mg via INTRAVENOUS
  Filled 2020-05-14: qty 6

## 2020-05-14 MED ORDER — MIDODRINE HCL 5 MG PO TABS
15.0000 mg | ORAL_TABLET | Freq: Three times a day (TID) | ORAL | Status: DC
  Administered 2020-05-14 – 2020-05-15 (×5): 15 mg via ORAL
  Filled 2020-05-14 (×5): qty 3

## 2020-05-14 MED ORDER — NOREPINEPHRINE 4 MG/250ML-% IV SOLN
0.0000 ug/min | INTRAVENOUS | Status: DC
  Administered 2020-05-14: 4 ug/min via INTRAVENOUS
  Filled 2020-05-14: qty 250

## 2020-05-14 NOTE — Progress Notes (Signed)
This chaplain was present with Pt., Pt.sister-Jane and notary to complete Pt. HCPOA.  The document was completed and scanned into electronic medical records. The original  and copies were given to the Pt..  The Pt. named [name] [name] as his Healthcare Agent and Donato Schultz as the person if the [place] is unwilling or unable to serve.    The chaplain understands from communication with Opal Sidles the Pt. is no longer a TAVR candidate and is ready for a Palliative Care provider visit. This chaplain is available for F/U spiritual care as needed.

## 2020-05-14 NOTE — Progress Notes (Addendum)
Patient ID: [name] [name], male   DOB: 11/03/1931, 86 y.o.   MRN: [id number]     Advanced Heart Failure Rounding Note  PCP-Cardiologist: Jenkins Rouge, MD   Subjective:    Developed hypotension yesterday, requiring addition of NE. Transferred back to ICU. H/H stable.   Remains on Milrinone 0.375 + NE 2 mcg. Midodrine increased to 10 tid. Co-ox 61%. SBP low 90s. Appears confused. Moaning. No respiratory distress. CVP 10-12.   IV amio discontinued for hypotension. Remains in AFib today, rate controlled in 70s. Brief run of NSVT, 10 beats.   SCr and BUN trending back up, SCr 1.89>>2.15>>2.43. BUN 73>>87.  Only 575 cc in UOP charted yesterday. Wt trending up, up 8 lb from yesterday.   Objective:   Weight Range: 79.8 kg Body mass index is 25.24 kg/m.   Vital Signs:   Temp:  [96.9 F (36.1 C)-97.9 F (36.6 C)] 97.2 F (36.2 C) (09/20 2351) Pulse Rate:  [50-79] 71 (09/21 0700) Resp:  [19-40] 23 (09/21 0700) BP: (63-100)/(45-71) 94/64 (09/21 0700) SpO2:  [89 %-97 %] 93 % (09/21 0700) Weight:  [79.8 kg] 79.8 kg (09/21 0447) Last BM Date: 05/13/20  Weight change: Filed Weights   05/12/20 0300 05/13/20 0316 05/14/20 0447  Weight: 76 kg 76 kg 79.8 kg    Intake/Output:   Intake/Output Summary (Last 24 hours) at 05/14/2020 0729 Last data filed at 05/14/2020 0700 Gross per 24 hour  Intake 684.13 ml  Output 575 ml  Net 109.13 ml      Physical Exam   CVP 10-12 General: elderly male, appears confused, No resp difficulty HEENT: normal Neck: supple. JVP 10 cm . Carotids 2+ bilat; no bruits. No lymphadenopathy or thryomegaly appreciated. Cor: PMI nondisplaced. Irregularly irregular rhythm and rate. No rubs, gallops. 2/6 AS . Lungs: bibasilar crackles   Abdomen: soft, nontender, nondistended. No hepatosplenomegaly. No bruits or masses. Good bowel sounds. Extremities: no cyanosis, clubbing, rash, trace bilateral LE edema + RUE PICC  Neuro: alert & orientedx3, cranial nerves grossly  intact. moves all 4 extremities w/o difficulty. Affect pleasant   Telemetry   Afib 70s, 2 runs of NSVT on tele, longest 10 beats     Labs    CBC Recent Labs    05/13/20 0920 05/13/20 1545  WBC 11.8* 11.4*  HGB 12.9* 12.7*  HCT 40.5 40.0  MCV 88.2 87.3  PLT 216 400   Basic Metabolic Panel Recent Labs    05/11/20 1447 05/12/20 0443 05/12/20 0443 05/13/20 0407 05/14/20 0420  NA  --  131*   < > 131* 130*  K  --  4.4   < > 4.4 4.4  CL  --  98   < > 97* 97*  CO2  --  24   < > 21* 23  GLUCOSE  --  102*   < > 99 109*  BUN  --  74*   < > 73* 87*  CREATININE  --  1.89*   < > 2.15* 2.43*  CALCIUM  --  8.0*   < > 8.0* 7.7*  MG 2.4 2.4  --   --   --    < > = values in this interval not displayed.   Liver Function Tests Recent Labs    05/12/20 0443  AST 37  ALT 34  ALKPHOS 98  BILITOT 2.1*  PROT 5.3*  ALBUMIN 2.3*   No results for input(s): LIPASE, AMYLASE in the last 72 hours. Cardiac Enzymes No results for input(s):  CKTOTAL, CKMB, CKMBINDEX, TROPONINI in the last 72 hours.  BNP: BNP (last 3 results) Recent Labs    05/20/2020 1101  BNP 3,817.0*    ProBNP (last 3 results) No results for input(s): PROBNP in the last 8760 hours.   D-Dimer No results for input(s): DDIMER in the last 72 hours. Hemoglobin A1C No results for input(s): HGBA1C in the last 72 hours. Fasting Lipid Panel No results for input(s): CHOL, HDL, LDLCALC, TRIG, CHOLHDL, LDLDIRECT in the last 72 hours. Thyroid Function Tests No results for input(s): TSH, T4TOTAL, T3FREE, THYROIDAB in the last 72 hours.  Invalid input(s): FREET3  Other results:   Imaging    DG CHEST PORT 1 VIEW  Result Date: 05/13/2020 CLINICAL DATA:  Cough and in of the sulci, acute on chronic systolic heart failure EXAM: PORTABLE CHEST 1 VIEW COMPARISON:  05/08/2020 FINDINGS: RIGHT-sided PICC line in place terminates in the distal superior vena cava. Heart size remains enlarged accentuated by portable technique.  Background interstitial prominence in signs of developing alveolar opacity in the bilateral chest, increased since the previous study. Increasing density particularly at the RIGHT lung base. On limited assessment skeletal structures without acute process. IMPRESSION: 1. Interval worsening of bilateral interstitial and airspace process, findings could be seen in the setting of asymmetric edema or atypical infection. 2. RIGHT PICC line unchanged. 3. Underlying cardiomegaly. Electronically Signed   By: Zetta Bills M.D.   On: 05/13/2020 07:53     Medications:     Scheduled Medications: . aspirin EC  81 mg Oral Daily  . Chlorhexidine Gluconate Cloth  6 each Topical Daily  . ezetimibe  10 mg Oral Daily   And  . simvastatin  20 mg Oral q1800  . midodrine  10 mg Oral TID WC  . mometasone-formoterol  2 puff Inhalation BID  . nystatin  5 mL Oral QID  . omega-3 acid ethyl esters  1 g Oral Daily  . senna-docusate  2 tablet Oral BID  . sodium chloride flush  10-40 mL Intracatheter Q12H  . sodium chloride flush  3 mL Intravenous Q12H    Infusions: . sodium chloride    . sodium chloride    . heparin 900 Units/hr (05/14/20 0700)  . milrinone 0.375 mcg/kg/min (05/14/20 0700)  . norepinephrine (LEVOPHED) Adult infusion 2 mcg/min (05/14/20 0700)    PRN Medications: sodium chloride, acetaminophen **OR** acetaminophen, alum & mag hydroxide-simeth, bisacodyl, hydrocortisone, hydrOXYzine, ipratropium-albuterol, nitroGLYCERIN, ondansetron **OR** ondansetron (ZOFRAN) IV, sodium chloride flush, sodium chloride flush, witch hazel-glycerin  Assessment/Plan   1. Acute on chronic systolic CHF: Echo this admission with new EF < 20%, normal RV, suspect low flow/low gradient severe AS. Prior echo in 2019 with severe AS, EF 50-55%. No chest pain, just dyspnea. There is concern that this may be the natural history over the last 2 years of severe AS.  It appears that he was lost to followup after 2019 echo. He has  a history of CAD, so cannot rule out a component of ischemic cardiomyopathy.  He did not present with ACS.  - On milrinone 0.375. CO-OX 61%.   - Volume trending up. CVP 10-12. Wt up 8 lb. SBP low 90s on low dose NE. SCr/BUN trending up - Continue milrinone 0.375 - Titrate NE to maintain SBP >100 - Continue midodrine, can further increase to 15 tid - Give IV Lasix 80 mg this am  - Will need RHC/LHC if his creatinine improves.  - Ultimately, would have TAVR.  However, we remain concerned  that we may not be able to get him there (renal dysfunction, frailty).  - Cardiac amyloidosis is certainly a concern with AS, conduction system disease.  Eventual cMRI if he stabilizes.  2. Aortic stenosis: Low flow/low gradient severe AS.  In 2019, echo showed severe AS.  As above, we are concerned that EF is low primarily due to untreated AS.  His only option at this point would be TAVR, but he will need considerable improvement prior and it is not clear that he will make it to TAVR given frailty and renal dysfunction.  - continue to follow. Structural team aware 3. AKI: Creatinine improving with milrinone  2.57 -> 2.17 -> 1.89->2.15->2.43 with BUN 100 -> 84 -> 74->73->87.  (Creatinine 1.3 in 2016) - Renal US - no hydronephrosis.  - Continue milrinone.  - titrate NE and midodrine to keep SBP >100 for renal perfusion  4. CAD: PCI in 2006.  Presentation not consistent with ACS, no chest pain.  HS-TnI mildly elevated with no trend.   - No chest pain. - Continue ASA, statin.  - Ideally would have cath but need improvement in creatinine.  5. Rhythm: Has been markedly abnormal.  Baseline NSR with long 1st degree AVB.  He appears to have alternating bundle branch blocks.  He also appears to have Wenckebach periodicity at times.  Significant conduction system disease, if he is able to make it to TAVR, I suspect he will need a pacemaker.  - in Afib w/ CVR this morning. Monitor w/ inotrope's. Did not tolerate IV amio due  to hypotension  6. Deconditioning: Mobilize with cardiac rehab, PT.  7. Hypokalemia - Resolved.  8. Deconditioning - PT/OT 9. [place] - DNR . Palliative Care following.  10. Cough - suspect 2/2 CHF. WBC stable. AF  - CVP trending up 10-12 today - CXR yesterday w/ worsening of bilateral interstitial and airspace Process - Give 80 IV Lasix    Length of Stay: [address] PA-C  05/14/2020, 7:29 AM  Advanced Heart Failure Team Pager [phone] (M-F; [place])  Please contact [facility] for night-coverage after hours (4p -7a ) and weekends on [email or web address]  Patient seen with PA, agree with the above note.   Yesterday, developed hypotension and was started on low dose NE and moved to CCU.  Currently, on milrinone 0.375 + NE 2.  SBP 90s.  Co-ox 61%, but creatinine back up to 2.4.    CXR with pulmonary edema yesterday, CVP about 10 today.   He remains in atrial fibrillation with rate 80s, amiodarone stopped when he got hypotensive yesterday.   He seems mildly confused this morning but oriented to person/place.   General: NAD Neck: JVP 9-10 cm, no thyromegaly or thyroid nodule.  Lungs: Crackles at bases bilaterally.  CV: Nondisplaced PMI.  Heart regular S1/S2, no S3/S4, 2/6 SEM RUSB.  1+ ankle edema.   Abdomen: Soft, nontender, no hepatosplenomegaly, no distention.  Skin: Intact without lesions or rashes.  Neurologic: Mild confusion but oriented to person/place.  Psych: Normal affect. Extremities: No clubbing or cyanosis.  HEENT: Normal.   Suspect end stage CHF due to long-standing severe AS, now low flow/low gradient AS. He is now on milrinone 0.375 + NE 2 with co-ox 61%, SBP in 90s.  Creatinine worse 2.4.  - Continue current milrinone and NE, can increase midodrine to 15 tid.  - CVP 10 but creatinine up, will give Lasix 60 mg IV x 1 today.   He remains in atrial  fibrillation, rate-controlled in 80s.  Will leave off amiodarone as BP dropped when this was started yesterday.   Continue heparin gtt.   Poor prognosis with what appears to be intractable renal and heart failure.  I do not think that we are going to be able to get him stabilized for TAVR.  We had an initial conversation about this today.  He is not ready to give up yet.  I will have palliative care talk with him and will update sister.   CRITICAL CARE Performed by: Loralie Champagne  Total critical care time: 35 minutes  Critical care time was exclusive of separately billable procedures and treating other patients.  Critical care was necessary to treat or prevent imminent or life-threatening deterioration.  Critical care was time spent personally by me on the following activities: development of treatment plan with patient and/or surrogate as well as nursing, discussions with consultants, evaluation of patient's response to treatment, examination of patient, obtaining history from patient or surrogate, ordering and performing treatments and interventions, ordering and review of laboratory studies, ordering and review of radiographic studies, pulse oximetry and re-evaluation of patient's condition.  Loralie Champagne 05/14/2020 8:32 AM

## 2020-05-14 NOTE — Progress Notes (Signed)
This chaplain attempted phone call to Opal Sidles at [phone] in response for notary assistance.  Voice mail was full.

## 2020-05-14 NOTE — TOC Progression Note (Addendum)
Transition of Care [facility]) - Progression Note    Patient Details  Name: [name] [name] MRN: [id number] Date of Birth: 1932-02-16  Transition of Care [facility]) CM/SW [place], [place] Phone Number: 05/14/2020, 4:28 PM  Clinical Narrative:     CSW left voicemail with patients sister Opal Sidles. CSW awaiting callback to provide SNF bed offers for patient. CSW to continue to follow and assist with discharge planning needs.  CSW will continue to follow.  Expected Discharge Plan: [place] Barriers to Discharge: Continued Medical Work up, Ship broker  Expected Discharge Plan and Services Expected Discharge Plan: [place] In-house Referral: Clinical Social Work     Living arrangements for the past 2 months: Single Family Home                                       Social Determinants of Health (SDOH) Interventions    Readmission Risk Interventions No flowsheet data found.

## 2020-05-14 NOTE — Progress Notes (Signed)
PROGRESS NOTE  [name] [name] WUJ:[id number] DOB: 1931-12-31 DOA: 05/03/2020 PCP: Burnard Bunting, MD  Brief History   89yom w/ CAD, aortic stenosis, presented w/ SOB, hypotensive, found to LVEF <20% w/ severe AS. Transferred to [facility] to see AHF team and remains on milrinone. Other issues: AKI, concern for cardiorenal, may have element of CKD. Now in ICU on vasopressor for cardiogenic shock. Continue management for acute cardiac issues as per cadiology. TAVR on hold.   A & P   Acute systolic CHF (congestive heart failure) (HCC) --continue as per cardiology including milrinone --now on vasopressor --guarded prognosis   Unspecified atrial fibrillation ([place]) --new dx --management per cardiology  --dropped BP on amio  Atrial fibrillation --management as per cardiology; dropped BP w/ amiodarone -- in SR w/ episodes of NSVT --K+ WNL  Aortic stenosis, severe --continue as per cardiology, TAVR considered but not stable enough to pursue  AKI (acute kidney injury) ([place]) --vs CKD, no previous labs but concern for cardiorenal syndrome. --UOP 575, -1 L since admit (no change) --creatinine higher today --management deferred to cardiology  COPD (chronic obstructive pulmonary disease) (HCC) --stable, continue nebs and Dulera  Elevated troponin --probably secondary to acute CHF. Management per cardiology  Grief reaction --from death of wife recently in MVA --appears stable, will continue hydroxyzine nightly as needed for insomnia as per psychiatry  Elevated LFTs --secondary to CHF congestive hepatopathy --transaminases have normalized --Tbili stable just over 2 --monitor  Oral candidiasis --resolved, can stop Nystatin 9/24  Disposition Plan:  Discussion: continue management as per cardiology, prognosis guarded; all management as per cardiology; no other active medical issues unconnected to CHF  Status is: Inpatient  Remains inpatient appropriate because:IV treatments appropriate  due to intensity of illness or inability to take PO and Inpatient level of care appropriate due to severity of illness   Dispo: The patient is from: Home              Anticipated d/c is to: TBD              Anticipated d/c date is: > 3 days              Patient currently is not medically stable to d/c.  DVT prophylaxis: heparin infusion  Place TED hose Start: 05/09/20 0920 Code Status: DNR Family Communication: sister 9/20  Murray Hodgkins, MD  [facility] Direct contact: see [email or web address] (further directions at bottom of note if needed) 7PM-7AM contact night coverage as at bottom of note 05/14/2020, 2:52 PM  LOS: 8 days   Significant Hospital Events   . 9/13 admit for CHF   Consults:  . AHF   Procedures:  . PICC  Significant Diagnostic Tests:  Marland Kitchen    Micro Data:  .    Antimicrobials:  .   Interval History/Subjective  F/u CHF Transferred to [place] overnight, on vasopressors and still on milrinone Feels weak, but no pain or SOB Appetite improved, eating better, no funny taste in mouth now  Objective   Vitals:  Vitals:   05/14/20 1322 05/14/20 1348  BP: 92/78   Pulse:  65  Resp:  (!) 27  Temp:    SpO2:  97%    Exam: Physical Exam Constitutional:      General: He is not in acute distress.    Appearance: He is not ill-appearing.     Comments: Appears weak, tired sitting in chair  HENT:     Ears:     Comments: Hearing grossly normal  Mouth/Throat:     Pharynx: No oropharyngeal exudate.     Comments: Tongue appears normal. Cardiovascular:     Rate and Rhythm: Normal rate and regular rhythm.     Heart sounds: No friction rub. No gallop.      Comments: Telemetry w/ NSVT Pulmonary:     Effort: Pulmonary effort is normal. No respiratory distress.     Breath sounds: Normal breath sounds. No wheezing, rhonchi or rales.  Neurological:     Mental Status: He is alert.  Psychiatric:        Mood and Affect: Mood normal.        Behavior: Behavior normal.    alert oriented, recognizes me, reports spontaneously "Dr. Aundra Dubin told me my kidneys work working well".  I have personally reviewed the following:   Today's Data  . Creatinine 2.17 > 1.89 > 2.15 > 2.43 . Tbili  2.3 > 2.1 > 2.1 > none today . CBC stable  Scheduled Meds: . aspirin EC  81 mg Oral Daily  . Chlorhexidine Gluconate Cloth  6 each Topical Daily  . ezetimibe  10 mg Oral Daily   And  . simvastatin  20 mg Oral q1800  . midodrine  15 mg Oral TID WC  . mometasone-formoterol  2 puff Inhalation BID  . nystatin  5 mL Oral QID  . omega-3 acid ethyl esters  1 g Oral Daily  . senna-docusate  2 tablet Oral BID  . sodium chloride flush  10-40 mL Intracatheter Q12H  . sodium chloride flush  3 mL Intravenous Q12H   Continuous Infusions: . sodium chloride    . sodium chloride    . heparin 950 Units/hr (05/14/20 1300)  . milrinone 0.375 mcg/kg/min (05/14/20 1300)  . norepinephrine (LEVOPHED) Adult infusion 3 mcg/min (05/14/20 1300)    Principal Problem:   Acute systolic CHF (congestive heart failure) (HCC) Active Problems:   Aortic stenosis, severe   AKI (acute kidney injury) ([place])   Abnormal heart rhythm   Unspecified atrial fibrillation (HCC)   Oral candidiasis   HYPERTENSION, BENIGN   CAD, NATIVE VESSEL   Elevated troponin   Weakness   Fall   COPD (chronic obstructive pulmonary disease) (HCC)   Pressure injury of skin   Elevated LFTs   Goals of care, counseling/discussion   Palliative care by specialist   Grief reaction   LOS: 8 days   How to contact the [facility] Attending or Consulting provider [place] or covering provider during after hours [place], for this patient?  1. Check the care team in [facility] and look for a) attending/consulting TR[facility] provider listed and b) the [facility] team listed 2. Log into [email or web address] and use [place]'s universal password to access. If you do not have the password, please contact the hospital operator. 3. Locate the [facility] provider you are  looking for under [facility] and page to a number that you can be directly reached. 4. If you still have difficulty reaching the provider, please page the [facility] (Director on Call) for the Hospitalists listed on amion for assistance.

## 2020-05-14 NOTE — Progress Notes (Signed)
Physical Therapy Treatment Patient Details Name: [name] [name] MRN: [id number] DOB: 05-20-1932 Today's Date: 05/14/2020    History of Present Illness Pt is 86 yo male admitted with elevated troponins, weakness, FTT.  Pt transferred from [facility] to [facility] with CHF and now on milrinone.  Hx of aortic stenosis, CAD, spinal stenosis. Per chart, pt has had difficulty managing at home since wife passed away recently.    PT Comments    Pt sitting in chair on arrival and reports fatigue and wanting to have medical status improve. Pt able to walk in room but limited by fatigue and able to perform seated HEP. Pt on 4L throughout with poor pleth on finger with inaccurate reading and when switched to ear reading 99%. Pt educated for progression and mobility with continued plan for SNF appropriate as pt unable to currently care for himself.    HR 73-84 Pre gait 93/61 (72) Post gait 93/67 (77)   Follow Up Recommendations  SNF;Supervision/Assistance - 24 hour     Equipment Recommendations  None recommended by PT    Recommendations for Other Services       Precautions / Restrictions Precautions Precautions: Fall    Mobility  Bed Mobility               General bed mobility comments: pt in chair on arrival  Transfers Overall transfer level: Needs assistance   Transfers: Sit to/from Stand Sit to Stand: Min assist         General transfer comment: cues for hand placement with assist to rise and stabilize. Pt able to scoot forward and back without assist  Ambulation/Gait Ambulation/Gait assistance: Min assist Gait Distance (Feet): 40 Feet Assistive device: Rolling walker (2 wheeled) Gait Pattern/deviations: Step-through pattern;Decreased stride length;Trunk flexed   Gait velocity interpretation: 1.31 - 2.62 ft/sec, indicative of limited community ambulator General Gait Details: pt with very forward head with cervical kyphosis, cues for posture, proximity to RW and safety. Fatigue  limiting distance   Stairs             Wheelchair Mobility    Modified Rankin (Stroke Patients Only)       Balance Overall balance assessment: Needs assistance   Sitting balance-Leahy Scale: Fair     Standing balance support: Bilateral upper extremity supported Standing balance-Leahy Scale: Poor Standing balance comment: required RW and min guard                            Cognition Arousal/Alertness: Awake/alert Behavior During Therapy: WFL for tasks assessed/performed Overall Cognitive Status: Within Functional Limits for tasks assessed                                        Exercises General Exercises - Lower Extremity Long Arc Quad: AROM;Both;15 reps;Seated Hip ABduction/ADduction: AAROM;Both;15 reps;Seated Hip Flexion/Marching: AROM;Both;15 reps;Seated Toe Raises: AROM;Both;Seated;15 reps Heel Raises: AROM;Both;Seated;15 reps    General Comments        Pertinent Vitals/Pain Pain Assessment: No/denies pain    Home Living                      Prior Function            PT Goals (current goals can now be found in the care plan section) Progress towards PT goals: Progressing toward goals    Frequency  Min 2X/week      PT Plan Current plan remains appropriate    Co-evaluation              AM-PAC PT "6 Clicks" Mobility   Outcome Measure  Help needed turning from your back to your side while in a flat bed without using bedrails?: A Little Help needed moving from lying on your back to sitting on the side of a flat bed without using bedrails?: A Little Help needed moving to and from a bed to a chair (including a wheelchair)?: A Little Help needed standing up from a chair using your arms (e.g., wheelchair or bedside chair)?: A Little Help needed to walk in hospital room?: A Little Help needed climbing 3-5 steps with a railing? : A Lot 6 Click Score: 17    End of Session Equipment Utilized During  Treatment: Gait belt Activity Tolerance: Patient limited by fatigue Patient left: in chair;with call bell/phone within reach;with chair alarm set;with family/visitor present;with nursing/sitter in room Nurse Communication: Mobility status PT Visit Diagnosis: Muscle weakness (generalized) (M62.81);Other abnormalities of gait and mobility (R26.89)     Time: 7092-9574 PT Time Calculation (min) (ACUTE ONLY): 24 min  Charges:  $Gait Training: 8-22 mins $Therapeutic Exercise: 8-22 mins                     Makye Radle P, PT Acute Rehabilitation Services Pager: [phone] Office: [place] 05/14/2020, 12:37 PM

## 2020-05-14 NOTE — Progress Notes (Signed)
ANTICOAGULATION CONSULT NOTE - Follow Up Consult  Pharmacy Consult for IV Heparin Indication: atrial fibrillation  No Known Allergies  Patient Measurements: Height: 5\' 10"  (177.8 cm) Weight: 79.8 kg (175 lb 14.8 oz) IBW/kg (Calculated) : 73 Heparin Dosing Weight: 76 kg  Vital Signs: Temp: 97.2 F (36.2 C) (09/20 2351) Temp Source: Axillary (09/20 2351) BP: 94/64 (09/21 0700) Pulse Rate: 71 (09/21 0700)  Labs: Recent Labs    05/12/20 0443 05/13/20 0407 05/13/20 0920 05/13/20 1545 05/13/20 1800 05/14/20 0420  HGB  --   --  12.9* 12.7*  --   --   HCT  --   --  40.5 40.0  --   --   PLT  --   --  216 219  --   --   HEPARINUNFRC  --   --   --   --  0.36 0.31  CREATININE 1.89* 2.15*  --   --   --  2.43*    Estimated Creatinine Clearance: 22.5 mL/min (A) (by C-G formula based on SCr of 2.43 mg/dL (H)).   Medical History: Past Medical History:  Diagnosis Date  . CAD, NATIVE VESSEL    a. stenting of the RCA in 06/2005 with a bare-metal stent with residual 50-70% narrowing in the left anterior descending coronary artery. b. nonischemic nuc 2007.  . Diverticulosis   . Hemorrhoids   . History of colon polyps   . HYPERLIPIDEMIA-MIXED   . HYPERTENSION, BENIGN   . Lumbar stenosis   . Moderate aortic stenosis   . PROSTATE CANCER, HX OF   . Skin cancer    face, neck  . Tubular adenoma   . Unspecified atrial fibrillation ([place]) 05/13/2020    Assessment: 86 yr old male with new development of afib.  Pharmacy was asked to begin anticoagulation with IV heparin.  Pt has been receiving Lovenox for DVT prophylaxis, last dose 9/19. Last CBC prior to starting IV heparin (9/17) was WNL; no known recent bleeding complications.  Heparin level this morning on lower end of therapeutic on drip rate 900 units/hr. CBC pending, was stable on last check . No Overt bleeding or infusion issues per discussion with nursing.  Goal of Therapy:  Heparin level 0.3-0.7 units/ml Monitor platelets by  anticoagulation protocol: Yes   Plan:  Increase heparin infusion to 950 units/hr Monitor daily HL, CBC, s/sx bleeding F/U plan for oral anticoagulation when able  Richardine Service, PharmD PGY2 Cardiology Pharmacy Resident Phone: [phone] 05/14/2020  7:39 AM  Please check [email or web address] for unit-specific pharmacy phone numbers.

## 2020-05-14 NOTE — Progress Notes (Signed)
Palliative:   [name] [name], [name], is sitting up in the [place] chair in his room.  He will make and somewhat keep eye contact.  He appears acutely/chronically ill, somewhat frail.  He is alert and oriented x3, able to make his needs known.  There is no family at bedside at this time.  I asked [name] [name] what the doctors have told him.  He is able to accurately tell me about his doctor's visits this morning.  He shares that his kidney function is worsening, and he is no longer eligible for the aorta repair surgery.  We talked about these changes.  [name] [name] states that he is still making urine, he feels like he is getting all fluid.  We talked about the kidneys removing water, but not filtering waste.  [name] [name] expresses his desire to have more time for improvement.  I share that if he is unable to improve, he is unlikely to be able to return to his own home, provide for self-care.  He nods in understanding.  [name] [name] agrees for me to call his sister/healthcare surrogate, [name] [name].   Call to sister, [name] [name] who shares that she saw [name] [name] earlier today.  She tells me that he was able to tell her what the doctors have shared, that he is no longer a candidate for TAVR.  [name] [name] endorses that her brother wants to continue to try for improvements.  I shared that we will continue to give him time, monitoring his blood pressure and labs.  We talked about the "what if's and maybe's".  If [name] [name] continues to weaken/worsen, and is no longer able to communicate, we would recommend to focus on comfort only.  [name] [name] states that she understands and agrees.  Conference with attending, cardiology, bedside nursing staff, transition of care team related to patient condition, needs, goals of care.  Plan:   At this point continue to treat the treatable but no CPR or intubation.  Time for outcomes.  Understands that in his current state he is not a candidate for TAVR.  74 minutes  [name], NP Palliative medicine team Team  phone [phone] Greater than 50% of this time was spent counseling and coordinating care related to the above assessment and plan.

## 2020-05-14 NOTE — Progress Notes (Signed)
Chaplain present bedside.  The chaplain understands Jane's request for notary was completed.

## 2020-05-15 ENCOUNTER — Inpatient Hospital Stay (HOSPITAL_COMMUNITY): Payer: Medicare Other

## 2020-05-15 DIAGNOSIS — I5023 Acute on chronic systolic (congestive) heart failure: Secondary | ICD-10-CM

## 2020-05-15 LAB — BASIC METABOLIC PANEL
Anion gap: 13 (ref 5–15)
BUN: 95 mg/dL — ABNORMAL HIGH (ref 8–23)
CO2: 19 mmol/L — ABNORMAL LOW (ref 22–32)
Calcium: 7.8 mg/dL — ABNORMAL LOW (ref 8.9–10.3)
Chloride: 96 mmol/L — ABNORMAL LOW (ref 98–111)
Creatinine, Ser: 2.34 mg/dL — ABNORMAL HIGH (ref 0.61–1.24)
GFR calc Af Amer: 28 mL/min — ABNORMAL LOW (ref >60)
GFR calc non Af Amer: 24 mL/min — ABNORMAL LOW (ref >60)
Glucose, Bld: 148 mg/dL — ABNORMAL HIGH (ref 70–99)
Potassium: 4.5 mmol/L (ref 3.5–5.1)
Sodium: 128 mmol/L — ABNORMAL LOW (ref 135–145)

## 2020-05-15 LAB — COOXEMETRY PANEL
Carboxyhemoglobin: 1.8 % — ABNORMAL HIGH (ref 0.5–1.5)
Methemoglobin: 1 % (ref 0.0–1.5)
O2 Saturation: 57.9 %
Total hemoglobin: 13.2 g/dL (ref 12.0–16.0)

## 2020-05-15 LAB — CBC
HCT: 37.6 % — ABNORMAL LOW (ref 39.0–52.0)
Hemoglobin: 12.3 g/dL — ABNORMAL LOW (ref 13.0–17.0)
MCH: 28.5 pg (ref 26.0–34.0)
MCHC: 32.7 g/dL (ref 30.0–36.0)
MCV: 87.2 fL (ref 80.0–100.0)
Platelets: 254 10*3/uL (ref 150–400)
RBC: 4.31 MIL/uL (ref 4.22–5.81)
RDW: 15 % (ref 11.5–15.5)
WBC: 13.2 10*3/uL — ABNORMAL HIGH (ref 4.0–10.5)
nRBC: 0 % (ref 0.0–0.2)

## 2020-05-15 LAB — HEPARIN LEVEL (UNFRACTIONATED): Heparin Unfractionated: 0.28 IU/mL — ABNORMAL LOW (ref 0.30–0.70)

## 2020-05-15 MED ORDER — MORPHINE SULFATE (CONCENTRATE) 10 MG/0.5ML PO SOLN
2.5000 mg | ORAL | Status: DC | PRN
  Administered 2020-05-15 – 2020-05-16 (×3): 2.6 mg via ORAL
  Filled 2020-05-15 (×3): qty 0.5

## 2020-05-15 MED ORDER — MORPHINE SULFATE (CONCENTRATE) 10 MG/0.5ML PO SOLN
2.5000 mg | ORAL | Status: DC | PRN
  Administered 2020-05-15 (×2): 2.6 mg via ORAL
  Filled 2020-05-15 (×2): qty 0.5

## 2020-05-15 MED ORDER — MORPHINE SULFATE (CONCENTRATE) 10 MG/0.5ML PO SOLN: 2.5000 mg | ORAL | Status: DC | PRN

## 2020-05-15 MED ORDER — NOREPINEPHRINE 16 MG/250ML-% IV SOLN
0.0000 ug/min | INTRAVENOUS | Status: DC
  Administered 2020-05-15: 9 ug/min via INTRAVENOUS
  Administered 2020-05-16: 10 ug/min via INTRAVENOUS
  Filled 2020-05-15 (×2): qty 250

## 2020-05-15 NOTE — Consult Note (Signed)
[facility]   MULTIDISCIPLINARY HEART VALVE TEAM  Date:  05/15/2020   ID:  [name] [name], DOB 1931-10-22, MRN [id number]  PCP:  Burnard Bunting, MD   Chief Complaint  Patient presents with  . Cough     HISTORY OF PRESENT ILLNESS: [name] [name] is a 86 y.o. male with severe aortic stenosis, consultation requested by Dr Aundra Dubin.   The patient has had known aortic stenosis and was essentially lost to cardiac follow-up in 2019.  He is hospitalized September 13 with acute on chronic combined systolic and diastolic heart failure.  His presentation is complicated by advanced conduction system disease with alternating bundle branch blocks as well as cardiogenic shock requiring inotropic therapy and progressive renal failure.  The patient is felt to have a very poor prognosis, but he has desired aggressive therapy if possible and structural heart consultation is requested.  The patient's family member is at the bedside.  The patient is extremely weak and has difficulty providing any history.  He just states that he feels bad, with shortness of breath and weakness.  He denies chest pain or pressure.  He is currently on milrinone and norepinephrine, requiring increasing amounts of oxygen.  Past Medical History:  Diagnosis Date  . CAD, NATIVE VESSEL    a. stenting of the RCA in 06/2005 with a bare-metal stent with residual 50-70% narrowing in the left anterior descending coronary artery. b. nonischemic nuc 2007.  . Diverticulosis   . Hemorrhoids   . History of colon polyps   . HYPERLIPIDEMIA-MIXED   . HYPERTENSION, BENIGN   . Lumbar stenosis   . Moderate aortic stenosis   . PROSTATE CANCER, HX OF   . Skin cancer    face, neck  . Tubular adenoma   . Unspecified atrial fibrillation ([place]) 05/13/2020    Current Facility-Administered Medications  Medication Dose Route Frequency Provider Last Rate Last Admin  . 0.9 %  sodium chloride infusion  250 mL Intravenous PRN Rosita Fire,  Brittainy M, PA-C      . 0.9 %  sodium chloride infusion  250 mL Intravenous Continuous Simmons, Brittainy M, PA-C      . acetaminophen (TYLENOL) tablet 650 mg  650 mg Oral Q6H PRN Consuelo Pandy, PA-C   650 mg at 05/14/20 2106   Or  . acetaminophen (TYLENOL) suppository 650 mg  650 mg Rectal Q6H PRN Consuelo Pandy, PA-C      . alum & mag hydroxide-simeth (MAALOX/MYLANTA) 200-200-20 MG/5ML suspension 15 mL  15 mL Oral Q6H PRN Lyda Jester M, PA-C   15 mL at 05/13/20 1225  . aspirin EC tablet 81 mg  81 mg Oral Daily Lyda Jester M, PA-C   81 mg at 05/15/20 1028  . bisacodyl (DULCOLAX) suppository 10 mg  10 mg Rectal Daily PRN Lyda Jester M, PA-C      . Chlorhexidine Gluconate Cloth 2 % PADS 6 each  6 each Topical Daily Consuelo Pandy, PA-C   6 each at 05/15/20 1030  . ezetimibe (ZETIA) tablet 10 mg  10 mg Oral Daily Lyda Jester M, PA-C   10 mg at 05/15/20 1028   And  . simvastatin (ZOCOR) tablet 20 mg  20 mg Oral q1800 Lyda Jester M, PA-C   20 mg at 05/14/20 1736  . heparin ADULT infusion 100 units/mL (25000 units/271mL sodium chloride 0.45%)  1,050 Units/hr Intravenous Continuous Richardine Service, RPH 10.5 mL/hr at 05/15/20 1712 1,050 Units/hr at 05/15/20 1712  . hydrocortisone (  ANUSOL-HC) suppository 25 mg  25 mg Rectal BID PRN Lyda Jester M, PA-C      . hydrOXYzine (ATARAX/VISTARIL) tablet 25 mg  25 mg Oral QHS PRN Consuelo Pandy, PA-C   25 mg at 05/14/20 2105  . ipratropium-albuterol (DUONEB) 0.5-2.5 (3) MG/3ML nebulizer solution 3 mL  3 mL Nebulization Q6H PRN Rosita Fire, Brittainy M, PA-C      . midodrine (PROAMATINE) tablet 15 mg  15 mg Oral TID WC Larey Dresser, MD   15 mg at 05/15/20 1231  . milrinone (PRIMACOR) 20 MG/100 ML (0.2 mg/mL) infusion  0.375 mcg/kg/min Intravenous Continuous Lyda Jester M, PA-C 8.61 mL/hr at 05/15/20 1446 0.375 mcg/kg/min at 05/15/20 1446  . mometasone-formoterol (DULERA) 200-5 MCG/ACT inhaler 2  puff  2 puff Inhalation BID Consuelo Pandy, PA-C   2 puff at 05/15/20 0732  . morphine CONCENTRATE 10 MG/0.5ML oral solution 2.6 mg  2.6 mg Oral Q2H PRN Donne Hazel, MD      . nitroGLYCERIN (NITROSTAT) SL tablet 0.4 mg  0.4 mg Sublingual Q5 min PRN Lyda Jester M, PA-C      . norepinephrine (LEVOPHED) 16 mg in 272mL premix infusion  0-10 mcg/min Intravenous Titrated Samuella Cota, MD 5.63 mL/hr at 05/15/20 1400 6 mcg/min at 05/15/20 1400  . nystatin (MYCOSTATIN) 100000 UNIT/ML suspension 500,000 Units  5 mL Oral QID Consuelo Pandy, PA-C   500,000 Units at 05/14/20 9629  . omega-3 acid ethyl esters (LOVAZA) capsule 1 g  1 g Oral Daily Lyda Jester M, PA-C   1 g at 05/15/20 1028  . ondansetron (ZOFRAN) tablet 4 mg  4 mg Oral Q6H PRN Lyda Jester M, PA-C       Or  . ondansetron [facility]) injection 4 mg  4 mg Intravenous Q6H PRN Lyda Jester M, PA-C   4 mg at 05/09/20 0825  . senna-docusate (Senokot-S) tablet 2 tablet  2 tablet Oral BID Consuelo Pandy, PA-C   2 tablet at 05/15/20 1029  . sodium chloride flush (NS) 0.9 % injection 10-40 mL  10-40 mL Intracatheter Q12H Lyda Jester M, PA-C   10 mL at 05/14/20 2106  . sodium chloride flush (NS) 0.9 % injection 10-40 mL  10-40 mL Intracatheter PRN Lyda Jester M, PA-C      . sodium chloride flush (NS) 0.9 % injection 3 mL  3 mL Intravenous Q12H Lyda Jester M, PA-C   3 mL at 05/13/20 2310  . sodium chloride flush (NS) 0.9 % injection 3 mL  3 mL Intravenous PRN Lyda Jester M, PA-C   3 mL at 05/10/20 0916  . witch hazel-glycerin (TUCKS) pad   Topical PRN Consuelo Pandy, PA-C   1 application at 52/84/13 1744    ALLERGIES:   Patient has no known allergies.   SOCIAL HISTORY:  The patient  reports that he has never smoked. He has never used smokeless tobacco. He reports that he does not drink alcohol and does not use drugs.   FAMILY HISTORY:  The patient's family history includes  Heart disease in his mother; Hypertension in his mother.   REVIEW OF SYSTEMS:  Negative except as above.   PHYSICAL EXAM: VS:  BP 96/61   Pulse 77   Temp 97.7 F (36.5 C) (Oral)   Resp (!) 30   Ht 5\' 10"  (1.778 m)   Wt 77 kg   SpO2 97%   BMI 24.36 kg/m  , BMI Body mass index is 24.36 kg/m. GEN:  Elderly, frail-appearing male, ill-appearing, in no acute distress HEENT: normal Neck: Moderate JVD. carotids 2+ without bruits or masses Cardiac: Regular rate and rhythm with 2/6 harsh systolic murmur at the right upper sternal border Respiratory:  Decreased BS bilaterally GI: soft, nontender, nondistended, + BS MS: no deformity or atrophy Skin: warm and dry, no rash Neuro:  Strength and sensation are intact Psych: euthymic mood, full affect   RECENT LABS: 05/23/2020: B Natriuretic Peptide 3,817.0; TSH 3.114 05/12/2020: ALT 34; Magnesium 2.4 05/15/2020: BUN 95; Creatinine, Ser 2.34; Hemoglobin 12.3; Platelets 254; Potassium 4.5; Sodium 128  05/08/2020: Cholesterol 106; HDL 31; LDL Cholesterol 57; Total CHOL/HDL Ratio 3.4; Triglycerides 90; VLDL 18   Estimated Creatinine Clearance: 23.4 mL/min (A) (by C-G formula based on SCr of 2.34 mg/dL (H)).   Wt Readings from Last 3 Encounters:  05/15/20 77 kg  05/11/18 93 kg  09/03/16 91.7 kg     CARDIAC STUDIES:  Echo:  IMPRESSIONS    1. Left ventricular ejection fraction, by estimation, is <20%. The left  ventricle has severely decreased function. The left ventricle demonstrates  global hypokinesis. There is mild left ventricular hypertrophy of the  basal-septal segment. Left  ventricular diastolic parameters are indeterminate. The average left  ventricular global longitudinal strain is -2.8 %. The global longitudinal  strain is abnormal.  2. Right ventricular systolic function is normal. The right ventricular  size is normal. There is severely elevated pulmonary artery systolic  pressure. The estimated right ventricular systolic  pressure is 09.3 mmHg.  3. The mitral valve is normal in structure. Mild to moderate mitral valve  regurgitation. No evidence of mitral stenosis.  4. The aortic valve is tricuspid. There is severe calcifcation of the  aortic valve. There is severe thickening of the aortic valve. Aortic valve  regurgitation is mild. No aortic stenosis is present. Aortic valve area,  by VTI measures 0.44 cm. Aortic  valve mean gradient measures 13.5 mmHg. Aortic valve Vmax measures 2.53  m/s. Dimensionless index is 0.15. The transaortic gradients are  underestimated due to severe LV dysfunction. This is consistent with  severe low flow, low gradient aortic stenosis.  5. The inferior vena cava is normal in size with <50% respiratory  variability, suggesting right atrial pressure of 8 mmHg.   LEFT VENTRICLE  PLAX 2D  LVIDd:     4.70 cm  LVIDs:     4.00 cm 2D Longitudinal Strain  LV PW:     1.00 cm 2D Strain GLS (A2C):  -2.3 %  LV IVS:    1.30 cm 2D Strain GLS (A3C):  -2.2 %  LVOT diam:   1.90 cm 2D Strain GLS (A4C):  -4.0 %  LV SV:     19    2D Strain GLS Avg:   -2.8 %  LV SV Index:  10  LVOT Area:   2.84 cm               3D Volume EF:             3D EF:    19 %             LV EDV:    224 ml             LV ESV:    182 ml             LV SV:    42 ml   LEFT ATRIUM       Index  RIGHT ATRIUM      Index  LA diam:    4.50 cm 2.31 cm/m RA Area:   12.90 cm  LA Vol (A2C):  48.7 ml 24.97 ml/m RA Volume:  31.40 ml 16.10 ml/m  LA Vol (A4C):  57.4 ml 29.44 ml/m  LA Biplane Vol: 54.3 ml 27.85 ml/m  AORTIC VALVE  AV Area (Vmax):  0.44 cm  AV Area (Vmean):  0.41 cm  AV Area (VTI):   0.44 cm  AV Vmax:      253.00 cm/s  AV Vmean:     167.500 cm/s  AV VTI:      0.444 m  AV Peak Grad:   25.6 mmHg  AV Mean Grad:   13.5 mmHg  LVOT  Vmax:     39.20 cm/s  LVOT Vmean:    24.400 cm/s  LVOT VTI:     0.069 m  LVOT/AV VTI ratio: 0.15    AORTA  Ao Asc diam: 3.40 cm   TRICUSPID VALVE  TR Peak grad:  59.6 mmHg  TR Vmax:    386.00 cm/s    SHUNTS  Systemic VTI: 0.07 m  Systemic Diam: 1.90 cm   ASSESSMENT AND PLAN: 56.  86 year old gentleman with cardiogenic shock, progressive heart and renal failure, and end-stage aortic stenosis (severe, stage D 2 aortic stenosis).  The patient's echocardiogram is personally reviewed.  He has very severe LV systolic dysfunction with LVEF less than 20%.  He has signs of end-stage aortic stenosis with a severely calcified and nearly immobile valve.  Mean gradients are low because of low flow, ranging from 14 to 17 mmHg.  The dimensionless index is very low at 0.15.  All of these findings are consistent with late stage aortic stenosis.  I have discussed the patient's case with his sister over the telephone.  She is his medical power of attorney.  I think the patient is actively dying from end-stage heart failure and aortic stenosis.  He is not a candidate for TAVR or any intervention in my opinion.  I think we should focus on comfort care moving forward.  The patient understands and states that he is not afraid of dying.  His wife just passed away a few months ago.  Discussed with his bedside nurse who will try to facilitate family members coming in to spend some time with him.  Continue current therapy for now and will defer to Dr. Aundra Dubin whether he wants to continue his inotropic therapy tomorrow.   Deatra James 05/15/2020 5:24 PM     [place] [address]  [phone] (office) [phone] (fax)

## 2020-05-15 NOTE — Progress Notes (Signed)
Occupational Therapy Treatment Patient Details Name: [name] [name] MRN: [id number] DOB: 1931-11-05 Today's Date: 05/15/2020    History of present illness Pt is 86 yo male admitted with elevated troponins, weakness, FTT.  Pt transferred from [facility] to [facility] with CHF and now on milrinone.  Hx of aortic stenosis, CAD, spinal stenosis. Per chart, pt has had difficulty managing at home since wife passed away recently.   OT comments  Pt had been up in the chair for several hours earlier today. Resting in bed, but agreed to grooming seated at EOB. Pt requiring min assist for bed mobility and increased time and min assist to stand and reposition at EOB prior to return to supine. Pt completed grooming with set up. Pt with decreased activity tolerance. Very aware of medical condition. Continues to be appropriate for SNF level rehab.   Follow Up Recommendations  SNF    Equipment Recommendations  Other (comment) (defer to next venue)    Recommendations for Other Services      Precautions / Restrictions Precautions Precautions: Fall Precaution Comments: watch BP       Mobility Bed Mobility Overal bed mobility: Needs Assistance Bed Mobility: Supine to Sit;Sit to Supine     Supine to sit: Min assist Sit to supine: Min assist      Transfers Overall transfer level: Needs assistance Equipment used: Rolling walker (2 wheeled) Transfers: Sit to/from Stand Sit to Stand: Min assist         General transfer comment: assist to rise and steady as pt took several steps toward [facility]    Balance Overall balance assessment: Needs assistance   Sitting balance-Leahy Scale: Fair     Standing balance support: Bilateral upper extremity supported Standing balance-Leahy Scale: Poor                             ADL either performed or assessed with clinical judgement   ADL Overall ADL's : Needs assistance/impaired     Grooming: Wash/dry hands;Wash/dry face;Oral  care;Sitting;Supervision/safety                                       Vision       Perception     Praxis      Cognition Arousal/Alertness: Awake/alert Behavior During Therapy: Flat affect Overall Cognitive Status: Within Functional Limits for tasks assessed                                 General Comments: slower processing        Exercises     Shoulder Instructions       General Comments      Pertinent Vitals/ Pain       Pain Assessment: No/denies pain  Home Living                                          Prior Functioning/Environment              Frequency  Min 2X/week        Progress Toward Goals  OT Goals(current goals can now be found in the care plan section)  Progress towards OT goals: Progressing toward goals  Acute Rehab OT  Goals Patient Stated Goal: to regain strength OT Goal Formulation: With patient Time For Goal Achievement: 05/25/20 Potential to Achieve Goals: Good  Plan Discharge plan remains appropriate    Co-evaluation                 AM-PAC OT "6 Clicks" Daily Activity     Outcome Measure   Help from another person eating meals?: None Help from another person taking care of personal grooming?: A Little Help from another person toileting, which includes using toliet, bedpan, or urinal?: A Little Help from another person bathing (including washing, rinsing, drying)?: A Little Help from another person to put on and taking off regular upper body clothing?: A Little Help from another person to put on and taking off regular lower body clothing?: A Little 6 Click Score: 19    End of Session Equipment Utilized During Treatment: Gait belt;Rolling walker  OT Visit Diagnosis: Unsteadiness on feet (R26.81);Other abnormalities of gait and mobility (R26.89);Muscle weakness (generalized) (M62.81)   Activity Tolerance Patient limited by fatigue   Patient Left in bed;with call  bell/phone within reach   Nurse Communication Mobility status        Time: 4827-0786 OT Time Calculation (min): 18 min  Charges: OT General Charges $OT Visit: 1 Visit OT Treatments $Self Care/Home Management : 8-22 mins  Nestor Lewandowsky, OTR/L Acute Rehabilitation Services Pager: [phone] Office: [phone]   Malka So 05/15/2020, 12:25 PM

## 2020-05-15 NOTE — Progress Notes (Signed)
ANTICOAGULATION CONSULT NOTE - Follow Up Consult  Pharmacy Consult for IV Heparin Indication: atrial fibrillation  No Known Allergies  Patient Measurements: Height: 5\' 10"  (177.8 cm) Weight: 77 kg (169 lb 12.1 oz) IBW/kg (Calculated) : 73 Heparin Dosing Weight: 76 kg  Vital Signs: Temp: 97.7 F (36.5 C) (09/22 0400) Temp Source: Oral (09/22 0400) BP: 98/62 (09/22 0600) Pulse Rate: 78 (09/22 0600)  Labs: Recent Labs    05/13/20 0407 05/13/20 0920 05/13/20 1545 05/13/20 1545 05/13/20 1800 05/14/20 0420 05/14/20 1239 05/15/20 0416  HGB  --    < > 12.7*   < >  --   --  12.8* 12.3*  HCT  --    < > 40.0  --   --   --  40.4 37.6*  PLT  --    < > 219  --   --   --  249 254  HEPARINUNFRC  --   --   --   --  0.36 0.31  --  0.28*  CREATININE 2.15*  --   --   --   --  2.43*  --  2.34*   < > = values in this interval not displayed.    Estimated Creatinine Clearance: 23.4 mL/min (A) (by C-G formula based on SCr of 2.34 mg/dL (H)).   Medical History: Past Medical History:  Diagnosis Date  . CAD, NATIVE VESSEL    a. stenting of the RCA in 06/2005 with a bare-metal stent with residual 50-70% narrowing in the left anterior descending coronary artery. b. nonischemic nuc 2007.  . Diverticulosis   . Hemorrhoids   . History of colon polyps   . HYPERLIPIDEMIA-MIXED   . HYPERTENSION, BENIGN   . Lumbar stenosis   . Moderate aortic stenosis   . PROSTATE CANCER, HX OF   . Skin cancer    face, neck  . Tubular adenoma   . Unspecified atrial fibrillation ([place]) 05/13/2020    Assessment: 86 yr old male with new development of afib.  Pharmacy was asked to begin anticoagulation with IV heparin.  Pt has been receiving Lovenox for DVT prophylaxis, last dose 9/19. Last CBC prior to starting IV heparin (9/17) was WNL; no known recent bleeding complications.  Heparin level slightly subtherapeutic this AM after increasing drip rate to 950 units/hr yesterday. CBC stable. No overt bleeding per  discussion with patient or infusion issues noted.  Goal of Therapy:  Heparin level 0.3-0.7 units/ml Monitor platelets by anticoagulation protocol: Yes   Plan:  Increase heparin infusion to 1050 units/hr Monitor daily HL, CBC, s/sx bleeding F/U plan for oral anticoagulation when able  Richardine Service, PharmD PGY2 Cardiology Pharmacy Resident Phone: [phone] 05/15/2020  7:11 AM  Please check [email or web address] for unit-specific pharmacy phone numbers.

## 2020-05-15 NOTE — Progress Notes (Addendum)
Patient ID: [name] [name], male   DOB: November 17, 1931, 86 y.o.   MRN: [id number]     Advanced Heart Failure Rounding Note  PCP-Cardiologist: Jenkins Rouge, MD   Subjective:    Remains on Milrinone 0.375 + NE 6 +  Midodrine 15 tid. Co-ox marginal 58%. BP remains soft, low 29N systolic.   CVP 9-10. SCr down slightly, 2.43>>2.34 but BUN continues to climb, now at 95. CVP 9-10.   Remains in rate controlled Afib, 70s.   Out of bed and sitting in chair. Reports he feels slightly better. Still wants to be aggressive w/ care.   Objective:   Weight Range: 77 kg Body mass index is 24.36 kg/m.   Vital Signs:   Temp:  [97.4 F (36.3 C)-98.2 F (36.8 C)] 97.7 F (36.5 C) (09/22 0746) Pulse Rate:  [28-124] 80 (09/22 0733) Resp:  [17-46] 24 (09/22 0733) BP: (84-112)/(43-81) 98/62 (09/22 0715) SpO2:  [84 %-100 %] 97 % (09/22 0733) Weight:  [77 kg] 77 kg (09/22 0500) Last BM Date: 05/13/20  Weight change: Filed Weights   05/13/20 0316 05/14/20 0447 05/15/20 0500  Weight: 76 kg 79.8 kg 77 kg    Intake/Output:   Intake/Output Summary (Last 24 hours) at 05/15/2020 0807 Last data filed at 05/15/2020 0700 Gross per 24 hour  Intake 1224.95 ml  Output 850 ml  Net 374.95 ml      Physical Exam   CVP 9-10 General: elderly male/ frail appearing, sitting up in chair No resp difficulty HEENT: normal Neck: supple. JVP ~8 cm . Carotids 2+ bilat; no bruits. No lymphadenopathy or thryomegaly appreciated. Cor: PMI nondisplaced. Irregularly irregular rhythm and rate. No rubs, gallops. 2/6 AS . Lungs: clear bilaterally, no wheezing  Abdomen: soft, nontender, nondistended. No hepatosplenomegaly. No bruits or masses. Good bowel sounds. Extremities: no cyanosis, clubbing, rash, trace bilateral LE edema + RUE PICC  Neuro: alert & orientedx3, cranial nerves grossly intact. moves all 4 extremities w/o difficulty. Affect pleasant   Telemetry   Afib 70s-80s  Labs    CBC Recent Labs     05/14/20 1239 05/15/20 0416  WBC 13.0* 13.2*  HGB 12.8* 12.3*  HCT 40.4 37.6*  MCV 87.3 87.2  PLT 249 989   Basic Metabolic Panel Recent Labs    05/14/20 0420 05/15/20 0416  NA 130* 128*  K 4.4 4.5  CL 97* 96*  CO2 23 19*  GLUCOSE 109* 148*  BUN 87* 95*  CREATININE 2.43* 2.34*  CALCIUM 7.7* 7.8*   Liver Function Tests No results for input(s): AST, ALT, ALKPHOS, BILITOT, PROT, ALBUMIN in the last 72 hours. No results for input(s): LIPASE, AMYLASE in the last 72 hours. Cardiac Enzymes No results for input(s): CKTOTAL, CKMB, CKMBINDEX, TROPONINI in the last 72 hours.  BNP: BNP (last 3 results) Recent Labs    05/13/2020 1101  BNP 3,817.0*    ProBNP (last 3 results) No results for input(s): PROBNP in the last 8760 hours.   D-Dimer No results for input(s): DDIMER in the last 72 hours. Hemoglobin A1C No results for input(s): HGBA1C in the last 72 hours. Fasting Lipid Panel No results for input(s): CHOL, HDL, LDLCALC, TRIG, CHOLHDL, LDLDIRECT in the last 72 hours. Thyroid Function Tests No results for input(s): TSH, T4TOTAL, T3FREE, THYROIDAB in the last 72 hours.  Invalid input(s): FREET3  Other results:   Imaging    No results found.   Medications:     Scheduled Medications: . aspirin EC  81 mg Oral Daily  .  Chlorhexidine Gluconate Cloth  6 each Topical Daily  . ezetimibe  10 mg Oral Daily   And  . simvastatin  20 mg Oral q1800  . midodrine  15 mg Oral TID WC  . mometasone-formoterol  2 puff Inhalation BID  . nystatin  5 mL Oral QID  . omega-3 acid ethyl esters  1 g Oral Daily  . senna-docusate  2 tablet Oral BID  . sodium chloride flush  10-40 mL Intracatheter Q12H  . sodium chloride flush  3 mL Intravenous Q12H    Infusions: . sodium chloride    . sodium chloride    . heparin 950 Units/hr (05/15/20 0700)  . milrinone 0.375 mcg/kg/min (05/15/20 0700)  . norepinephrine (LEVOPHED) Adult infusion 6 mcg/min (05/15/20 0700)    PRN  Medications: sodium chloride, acetaminophen **OR** acetaminophen, alum & mag hydroxide-simeth, bisacodyl, hydrocortisone, hydrOXYzine, ipratropium-albuterol, nitroGLYCERIN, ondansetron **OR** ondansetron (ZOFRAN) IV, sodium chloride flush, sodium chloride flush, witch hazel-glycerin  Assessment/Plan   1. Acute on chronic systolic CHF: Echo this admission with new EF < 20%, normal RV, suspect low flow/low gradient severe AS. Prior echo in 2019 with severe AS, EF 50-55%. No chest pain, just dyspnea. There is concern that this may be the natural history over the last 2 years of severe AS.  It appears that he was lost to followup after 2019 echo. He has a history of CAD, so cannot rule out a component of ischemic cardiomyopathy.  He did not present with ACS.  - On milrinone 0.375. CO-OX 58%.   - Volume status ok. CVP 9-10. Will hold diuretics today w/ rising BUN and low BP  - Continue milrinone 0.375 + NE and midodrine  - will ask structural heart team to evaluate for potential TAVR.  However, we remain concerned that we may not be able to get him there (renal dysfunction, frailty).  - Cardiac amyloidosis is certainly a concern with AS, conduction system disease.  Eventual cMRI if he stabilizes.  2. Aortic stenosis: Low flow/low gradient severe AS.  In 2019, echo showed severe AS.  As above, we are concerned that EF is low primarily due to untreated AS.  His only option at this point would be TAVR. Dr. Burt Knack to evaluate later today  3. AKI: Creatinine improving with milrinone  2.57 -> 2.17 -> 1.89 -> 2.15 -> 2.43 -> 2.34 with BUN 100 -> 84 -> 74->73->87->95.  (Creatinine 1.3 in 2016) - Renal US - no hydronephrosis.  - Continue milrinone. - continue NE and midodrine for BP support - hold diuretics today 4. CAD: PCI in 2006.  Presentation not consistent with ACS, no chest pain.  HS-TnI mildly elevated with no trend.   - No chest pain. - Continue ASA, statin.  - Ideally would have cath but need  improvement in creatinine.  5. Rhythm: Has been markedly abnormal.  Baseline NSR with long 1st degree AVB.  He appears to have alternating bundle branch blocks.  He also appears to have Wenckebach periodicity at times.  Significant conduction system disease, if he is able to make it to TAVR, I suspect he will need a pacemaker.  - in Afib w/ CVR this morning. Monitor w/ inotrope's. Did not tolerate IV amio due to hypotension  6. Deconditioning: Mobilize with cardiac rehab, PT.  7. Hypokalemia - Resolved.  8. Deconditioning - PT/OT - ambulate today  9. [place] - DNR . Palliative Care following.   Length of Stay: [address] PA-C  05/15/2020, 8:07 AM  Advanced Heart Failure Team Pager [phone] (M-F; [place])  Please contact [facility] for night-coverage after hours (4p -7a ) and weekends on [email or web address]  Patient seen with PA, agree with the above note.   Currently, on milrinone 0.375 + NE 9.  SBP 90s.  Co-ox 58%, CVP 9-10.  Creatinine down to 2.34 but BUN up to 95.    He remains in atrial fibrillation with rate 80s, amiodarone stopped due to hypotension on 9/20.   He seems mildly confused this morning but oriented to person/place.   General: NAD Neck: JVP 8-9 cm, no thyromegaly or thyroid nodule.  Lungs: Decreased at bases.  CV: Nondisplaced PMI.  Heart sounds soft, irregular S1/S2, no S3/S4, 2/6 SEM RUSB.  1+ ankle edema.  Abdomen: Soft, nontender, no hepatosplenomegaly, no distention.  Skin: Intact without lesions or rashes.  Neurologic: Alert and oriented x 3.  Psych: Normal affect. Extremities: No clubbing or cyanosis.  HEENT: Normal.   Suspect end stage CHF due to long-standing severe AS, now low flow/low gradient AS. He is now on milrinone 0.375 + NE 6 with co-ox 58%, SBP in 90s.  Creatinine lower at 2.34 but BUN worse at 95.  - Continue current milrinone and midodrine to 15 tid. Will try to wean down on NE.   - CVP 9-10, will hold diuretics today with rising  BUN.    He remains in atrial fibrillation, rate-controlled in 80s.  Will leave off amiodarone as BP dropped when this was started yesterday.  Continue heparin gtt.   He remains weak, was able to walk to door with PT assistance yesterday.   Poor prognosis with what appears to be intractable renal and heart failure.  I do not think that we are going to be able to get him stabilized for TAVR.  I have talked with him about this and palliative care saw him again yesterday.  I have updated his sister. He is not ready to give up yet.  I will have Dr. Burt Knack speak with him today regarding TAVR.   CRITICAL CARE Performed by: Loralie Champagne  Total critical care time: 35 minutes  Critical care time was exclusive of separately billable procedures and treating other patients.  Critical care was necessary to treat or prevent imminent or life-threatening deterioration.  Critical care was time spent personally by me on the following activities: development of treatment plan with patient and/or surrogate as well as nursing, discussions with consultants, evaluation of patient's response to treatment, examination of patient, obtaining history from patient or surrogate, ordering and performing treatments and interventions, ordering and review of laboratory studies, ordering and review of radiographic studies, pulse oximetry and re-evaluation of patient's condition.  Loralie Champagne 05/15/2020 8:42 AM

## 2020-05-15 NOTE — Progress Notes (Signed)
PROGRESS NOTE    [name] [name]  XTK:[id number] DOB: 1932-01-14 DOA: 05/09/2020 PCP: Burnard Bunting, MD    Brief Narrative:  65yom w/ CAD, aortic stenosis, presented w/ SOB, hypotensive, found to LVEF <20% w/ severe AS. Transferred to [facility] to see AHF team and remains on milrinone. Other issues: AKI, concern for cardiorenal, may have element of CKD. Now in ICU on vasopressor for cardiogenic shock. Continue management for acute cardiac issues as per cadiology. TAVR on hold.  Assessment & Plan:   Principal Problem:   Acute systolic CHF (congestive heart failure) (HCC) Active Problems:   HYPERTENSION, BENIGN   CAD, NATIVE VESSEL   Elevated troponin   Aortic stenosis, severe   Weakness   Fall   COPD (chronic obstructive pulmonary disease) (HCC)   Pressure injury of skin   AKI (acute kidney injury) ([place])   Elevated LFTs   Goals of care, counseling/discussion   Palliative care by specialist   Grief reaction   Abnormal heart rhythm   Oral candidiasis   Unspecified atrial fibrillation (HCC)   Acute systolic CHF (congestive heart failure) ([place]) --continue as per cardiology including milrinone --now was started on vasopressor per Cardiology --guarded prognosis noted --To be seen by structural heart team for possible TAVR  Unspecified atrial fibrillation ([place]) --new dx this visit --management per cardiology  --dropped BP on amio  Atrial fibrillation --management as per cardiology; dropped BP w/ amiodarone -- in SR w/ episodes of NSVT, currently rate controlled --K+ WNL  Aortic stenosis, severe --continue as per cardiology, TAVR considered but not stable enough to pursue  AKI (acute kidney injury) ([place]) --vs CKD, no previous labs but concern for cardiorenal syndrome. --UOP 575, -1 L since admit (no change) --creatinine higher today --management deferred to cardiology  COPD (chronic obstructive pulmonary disease) (HCC) --stable, continue nebs and Dulera --no audible  wheezing on exam  Elevated troponin --probably secondary to acute CHF. Management per cardiology  Grief reaction --secondary to death of wife recently in MVA --appears stable, will continue hydroxyzine nightly as needed for insomnia as per psychiatry  Elevated LFTs --secondary to CHF congestive hepatopathy --transaminases have normalized --Tbili stable just over 2 --monitor  Oral candidiasis --resolved, can stop Nystatin 9/24  DVT prophylaxis: Heparin gtt Code Status: DNR Family Communication: Pt in room, family not at bedside  Status is: Inpatient  Remains inpatient appropriate because:Hemodynamically unstable, Unsafe d/c plan and IV treatments appropriate due to intensity of illness or inability to take PO   Dispo: The patient is from: Home              Anticipated d/c is to: SNF              Anticipated d/c date is: > 3 days              Patient currently is not medically stable to d/c.   Consultants:   Cardiology  Palliative Care  Procedures:   PICC  Antimicrobials: Anti-infectives (From admission, onward)   None       Subjective: Very tired this AM, difficult to assess  Objective: Vitals:   05/15/20 1215 05/15/20 1230 05/15/20 1245 05/15/20 1300  BP: 106/60 (!) 91/59 (!) 89/67 91/67  Pulse: 65 88 80 79  Resp: (!) 29 (!) 26 (!) 34 (!) 29  Temp:      TempSrc:      SpO2: 91% 97% 94% 94%  Weight:      Height:        Intake/Output  Summary (Last 24 hours) at 05/15/2020 1434 Last data filed at 05/15/2020 1300 Gross per 24 hour  Intake 1417.46 ml  Output 950 ml  Net 467.46 ml   Filed Weights   05/13/20 0316 05/14/20 0447 05/15/20 0500  Weight: 76 kg 79.8 kg 77 kg    Examination:  General exam: Appears calm and comfortable  Respiratory system: Clear to auscultation. Respiratory effort normal. Cardiovascular system: S1 & S2 heard, regular Gastrointestinal system: Abdomen is nondistended, soft and nontender. No organomegaly or masses  felt. Normal bowel sounds heard. Central nervous system: Drowsy. No focal neurological deficits. Extremities: Symmetric 5 x 5 power. Skin: No rashes, lesions Psychiatry: difficult to assess given mentation  Data Reviewed: I have personally reviewed following labs and imaging studies  CBC: Recent Labs  Lab 05/09/20 0350 05/09/20 0350 05/10/20 0305 05/13/20 0920 05/13/20 1545 05/14/20 1239 05/15/20 0416  WBC 9.1   < > 10.2 11.8* 11.4* 13.0* 13.2*  NEUTROABS 7.5  --  8.5*  --   --   --   --   HGB 16.3   < > 15.6 12.9* 12.7* 12.8* 12.3*  HCT 51.2   < > 48.0 40.5 40.0 40.4 37.6*  MCV 88.9   < > 88.2 88.2 87.3 87.3 87.2  PLT 268   < > 251 216 219 249 254   < > = values in this interval not displayed.   Basic Metabolic Panel: Recent Labs  Lab 05/09/20 1140 05/09/20 1140 05/10/20 0305 05/10/20 0305 05/11/20 0421 05/11/20 1447 05/12/20 0443 05/13/20 0407 05/14/20 0420 05/15/20 0416  NA 138   < > 138   < > 133*  --  131* 131* 130* 128*  K 4.0   < > 3.9   < > 3.3*  --  4.4 4.4 4.4 4.5  CL 102   < > 103   < > 98  --  98 97* 97* 96*  CO2 22   < > 23   < > 23  --  24 21* 23 19*  GLUCOSE 131*   < > 125*   < > 137*  --  102* 99 109* 148*  BUN 104*   < > 100*   < > 84*  --  74* 73* 87* 95*  CREATININE 2.52*   < > 2.57*   < > 2.17*  --  1.89* 2.15* 2.43* 2.34*  CALCIUM 8.7*   < > 8.4*   < > 7.8*  --  8.0* 8.0* 7.7* 7.8*  MG 2.7*  --  2.5*  --   --  2.4 2.4  --   --   --    < > = values in this interval not displayed.   GFR: Estimated Creatinine Clearance: 23.4 mL/min (A) (by C-G formula based on SCr of 2.34 mg/dL (H)). Liver Function Tests: Recent Labs  Lab 05/09/20 0350 05/09/20 1140 05/10/20 0305 05/12/20 0443  AST 47* 44* 38 37  ALT 50* 50* 44 34  ALKPHOS 181* 167* 148* 98  BILITOT 2.1* 2.1* 2.1* 2.1*  PROT 5.6* 5.5* 5.7* 5.3*  ALBUMIN 2.9* 2.8* 2.7* 2.3*   No results for input(s): LIPASE, AMYLASE in the last 168 hours. No results for input(s): AMMONIA in the last  168 hours. Coagulation Profile: No results for input(s): INR, PROTIME in the last 168 hours. Cardiac Enzymes: No results for input(s): CKTOTAL, CKMB, CKMBINDEX, TROPONINI in the last 168 hours. BNP (last 3 results) No results for input(s): PROBNP in the last 8760 hours.  HbA1C: No results for input(s): HGBA1C in the last 72 hours. CBG: Recent Labs  Lab 05/13/20 1257  GLUCAP 162*   Lipid Profile: No results for input(s): CHOL, HDL, LDLCALC, TRIG, CHOLHDL, LDLDIRECT in the last 72 hours. Thyroid Function Tests: No results for input(s): TSH, T4TOTAL, FREET4, T3FREE, THYROIDAB in the last 72 hours. Anemia Panel: No results for input(s): VITAMINB12, FOLATE, FERRITIN, TIBC, IRON, RETICCTPCT in the last 72 hours. Sepsis Labs: Recent Labs  Lab 05/09/20 1033 05/09/20 1623 05/10/20 1145  LATICACIDVEN 2.2* 2.0* 1.6    Recent Results (from the past 240 hour(s))  SARS Coronavirus 2 by RT PCR (hospital order, performed in [facility] hospital lab) Nasopharyngeal Nasopharyngeal Swab     Status: None   Collection Time: 04/26/2020 11:07 AM   Specimen: Nasopharyngeal Swab  Result Value Ref Range Status   SARS Coronavirus 2 NEGATIVE NEGATIVE Final    Comment: (NOTE) SARS-CoV-2 target nucleic acids are NOT DETECTED.  The SARS-CoV-2 RNA is generally detectable in upper and lower respiratory specimens during the acute phase of infection. The lowest concentration of SARS-CoV-2 viral copies this assay can detect is 250 copies / mL. A negative result does not preclude SARS-CoV-2 infection and should not be used as the sole basis for treatment or other patient management decisions.  A negative result may occur with improper specimen collection / handling, submission of specimen other than nasopharyngeal swab, presence of viral mutation(s) within the areas targeted by this assay, and inadequate number of viral copies (<250 copies / mL). A negative result must be combined with  clinical observations, patient history, and epidemiological information.  Fact Sheet for Patients:   StrictlyIdeas.no  Fact Sheet for Healthcare Providers: BankingDealers.co.za  This test is not yet approved or  cleared by the [place] FDA and has been authorized for detection and/or diagnosis of SARS-CoV-2 by FDA under an Emergency Use Authorization (EUA).  This EUA will remain in effect (meaning this test can be used) for the duration of the COVID-19 declaration under Section 564(b)(1) of the Act, 21 U.S.C. section 360bbb-3(b)(1), unless the authorization is terminated or revoked sooner.  Performed at [facility], [place] [address] [place], [place] [address]   MRSA PCR Screening     Status: None   Collection Time: 05/08/20 11:36 PM   Specimen: Nasopharyngeal  Result Value Ref Range Status   MRSA by PCR NEGATIVE NEGATIVE Final    Comment:        The GeneXpert MRSA Assay (FDA approved for NASAL specimens only), is one component of a comprehensive MRSA colonization surveillance program. It is not intended to diagnose MRSA infection nor to guide or monitor treatment for MRSA infections. Performed at [facility], [place] [address], [place], [place] [address]      Radiology Studies: No results found.  Scheduled Meds: . aspirin EC  81 mg Oral Daily  . Chlorhexidine Gluconate Cloth  6 each Topical Daily  . ezetimibe  10 mg Oral Daily   And  . simvastatin  20 mg Oral q1800  . midodrine  15 mg Oral TID WC  . mometasone-formoterol  2 puff Inhalation BID  . nystatin  5 mL Oral QID  . omega-3 acid ethyl esters  1 g Oral Daily  . senna-docusate  2 tablet Oral BID  . sodium chloride flush  10-40 mL Intracatheter Q12H  . sodium chloride flush  3 mL Intravenous Q12H   Continuous Infusions: . sodium chloride    . sodium  chloride    . heparin 1,050 Units/hr (05/15/20 1300)  . milrinone 0.375 mcg/kg/min  (05/15/20 1300)  . norepinephrine (LEVOPHED) Adult infusion 6 mcg/min (05/15/20 1300)     LOS: 9 days   Marylu Lund, MD [facility] Pager On Amion  If 7PM-7AM, please contact night-coverage 05/15/2020, 2:34 PM

## 2020-05-15 NOTE — Plan of Care (Signed)
  Problem: Education: Goal: Knowledge of General Education information will improve Description: Including pain rating scale, medication(s)/side effects and non-pharmacologic comfort measures Outcome: Progressing   Problem: Clinical Measurements: Goal: Will remain free from infection Outcome: Progressing   Problem: Nutrition: Goal: Adequate nutrition will be maintained Outcome: Progressing   Problem: Coping: Goal: Level of anxiety will decrease Outcome: Progressing   Problem: Pain Managment: Goal: General experience of comfort will improve Outcome: Progressing   Problem: Safety: Goal: Ability to remain free from injury will improve Outcome: Progressing   Problem: Health Behavior/Discharge Planning: Goal: Ability to manage health-related needs will improve Outcome: Not Progressing   Problem: Clinical Measurements: Goal: Ability to maintain clinical measurements within normal limits will improve Outcome: Not Progressing Goal: Diagnostic test results will improve Outcome: Not Progressing Goal: Respiratory complications will improve Outcome: Not Progressing Goal: Cardiovascular complication will be avoided Outcome: Not Progressing   Problem: Activity: Goal: Risk for activity intolerance will decrease Outcome: Not Progressing   Problem: Elimination: Goal: Will not experience complications related to bowel motility Outcome: Not Progressing Goal: Will not experience complications related to urinary retention Outcome: Not Progressing   Problem: Skin Integrity: Goal: Risk for impaired skin integrity will decrease Outcome: Not Progressing

## 2020-05-15 NOTE — Treatment Plan (Signed)
Called about pt's respiratory status progressively worsening today with increased O2 requirements. Ordered and reviewed STAT CXR. Findings worrisome for worsening pulm edema with effusions. Appreciate input by Cardiology. Pt now in cardiogenic shock and actively dying. Had earlier discussed with Palliative Care, who had recommended trial of low dose SL morphine to alleviate air hunger and/or pain. Palliative Care to f/u with pt and family tomorrow. Called and updated patient's sister over phone

## 2020-05-15 NOTE — Progress Notes (Signed)
Dr. Wyline Copas notified of patient's declining status - more generalized edema, requiring more O2 support, increasing drowsiness, and more ectopy noted on the heart monitor. Patient continuing to c/o being miserable and stating "I can't do this anymore." Dr. Wyline Copas placed new orders for sublingual morphine and pcxr. Stated to call Dr. Burt Knack as this is presently outside of his scope. Dr. Burt Knack notified and is currently at patient's bedside.  Joellen Jersey, RN

## 2020-05-15 NOTE — Progress Notes (Signed)
Patient was seen for SOB. Chart was reviewed and patient examined & interviewed.   He is in cardiogenic shock and progressively worsening despite aggressive treatments.    [name] [name] is tachypneic with increased WOB but able to speak a few words between breaths and is fully oriented. He understands that his condition is worsening and he is unlikely to survive this. He talks about his wife of 54 years who recently passed, talks about his faith in [place] and his confidence that he will be with Him when he dies. He denies any pain but complains of SOB and notes that the oral morphine helped with this for a short time. We discussed increasing the frequency of morphine to help alleviate the air hunger but with the understanding that it would not help the underlying disease and likely hasten his passing. He was agreeable to this.   His sister Education officer, community) was updated by phone, agrees with morphine for air-hunger and plans to visit him tonight.

## 2020-05-15 NOTE — TOC Progression Note (Signed)
Transition of Care [facility]) - Progression Note    Patient Details  Name: [name] [name] MRN: [id number] Date of Birth: 08-13-1932  Transition of Care [facility]) CM/SW [place], [place] Phone Number: 05/15/2020, 4:43 PM  Clinical Narrative:     CSW spoke with patients sister [name] [name] and provided SNF bed offers for patient. [name] [name] is going to discuss offers with patient. CSW will follow back up with patients sister [name] [name] and patient tomorrow with SNF choice.  Pending SNF choice.  CSW will continue to follow.   Expected Discharge Plan: [place] Barriers to Discharge: Continued Medical Work up, Ship broker  Expected Discharge Plan and Services Expected Discharge Plan: [place] In-house Referral: Clinical Social Work     Living arrangements for the past 2 months: Single Family Home                                       Social Determinants of Health (SDOH) Interventions    Readmission Risk Interventions No flowsheet data found.

## 2020-05-16 DIAGNOSIS — Z515 Encounter for palliative care: Secondary | ICD-10-CM

## 2020-05-16 LAB — BASIC METABOLIC PANEL
Anion gap: 12 (ref 5–15)
BUN: 115 mg/dL — ABNORMAL HIGH (ref 8–23)
CO2: 20 mmol/L — ABNORMAL LOW (ref 22–32)
Calcium: 7.9 mg/dL — ABNORMAL LOW (ref 8.9–10.3)
Chloride: 94 mmol/L — ABNORMAL LOW (ref 98–111)
Creatinine, Ser: 2.58 mg/dL — ABNORMAL HIGH (ref 0.61–1.24)
GFR calc Af Amer: 25 mL/min — ABNORMAL LOW (ref >60)
GFR calc non Af Amer: 22 mL/min — ABNORMAL LOW (ref >60)
Glucose, Bld: 121 mg/dL — ABNORMAL HIGH (ref 70–99)
Potassium: 4.8 mmol/L (ref 3.5–5.1)
Sodium: 126 mmol/L — ABNORMAL LOW (ref 135–145)

## 2020-05-16 LAB — CBC
HCT: 36.4 % — ABNORMAL LOW (ref 39.0–52.0)
Hemoglobin: 11.8 g/dL — ABNORMAL LOW (ref 13.0–17.0)
MCH: 28.2 pg (ref 26.0–34.0)
MCHC: 32.4 g/dL (ref 30.0–36.0)
MCV: 87.1 fL (ref 80.0–100.0)
Platelets: 277 10*3/uL (ref 150–400)
RBC: 4.18 MIL/uL — ABNORMAL LOW (ref 4.22–5.81)
RDW: 15 % (ref 11.5–15.5)
WBC: 15.3 10*3/uL — ABNORMAL HIGH (ref 4.0–10.5)
nRBC: 0 % (ref 0.0–0.2)

## 2020-05-16 LAB — COOXEMETRY PANEL
Carboxyhemoglobin: 1.7 % — ABNORMAL HIGH (ref 0.5–1.5)
Methemoglobin: 1.1 % (ref 0.0–1.5)
O2 Saturation: 50.7 %
Total hemoglobin: 11.9 g/dL — ABNORMAL LOW (ref 12.0–16.0)

## 2020-05-16 LAB — HEPARIN LEVEL (UNFRACTIONATED): Heparin Unfractionated: 0.36 IU/mL (ref 0.30–0.70)

## 2020-05-16 MED ORDER — ACETAMINOPHEN 650 MG RE SUPP: 650.0000 mg | Freq: Four times a day (QID) | RECTAL | Status: DC | PRN

## 2020-05-16 MED ORDER — ACETAMINOPHEN 325 MG PO TABS: 650.0000 mg | ORAL_TABLET | Freq: Four times a day (QID) | ORAL | Status: DC | PRN

## 2020-05-16 MED ORDER — BIOTENE DRY MOUTH MT LIQD: 15.0000 mL | OROMUCOSAL | Status: DC | PRN

## 2020-05-16 MED ORDER — LORAZEPAM 2 MG/ML IJ SOLN
1.0000 mg | INTRAMUSCULAR | Status: DC | PRN
  Administered 2020-05-16: 1 mg via INTRAVENOUS
  Filled 2020-05-16: qty 1

## 2020-05-16 MED ORDER — GLYCOPYRROLATE 0.2 MG/ML IJ SOLN: 0.2000 mg | INTRAMUSCULAR | Status: DC | PRN

## 2020-05-16 MED ORDER — LORAZEPAM 1 MG PO TABS: 1.0000 mg | ORAL_TABLET | ORAL | Status: DC | PRN

## 2020-05-16 MED ORDER — GLYCOPYRROLATE 0.2 MG/ML IJ SOLN
0.2000 mg | INTRAMUSCULAR | Status: DC | PRN
  Administered 2020-05-16: 0.2 mg via INTRAVENOUS
  Filled 2020-05-16: qty 1

## 2020-05-16 MED ORDER — ONDANSETRON 4 MG PO TBDP
4.0000 mg | ORAL_TABLET | Freq: Four times a day (QID) | ORAL | Status: DC | PRN
  Filled 2020-05-16: qty 1

## 2020-05-16 MED ORDER — HALOPERIDOL LACTATE 2 MG/ML PO CONC
0.5000 mg | ORAL | Status: DC | PRN
  Filled 2020-05-16: qty 0.3

## 2020-05-16 MED ORDER — HALOPERIDOL LACTATE 5 MG/ML IJ SOLN: 0.5000 mg | INTRAMUSCULAR | Status: DC | PRN

## 2020-05-16 MED ORDER — LORAZEPAM 2 MG/ML PO CONC: 1.0000 mg | ORAL | Status: DC | PRN

## 2020-05-16 MED ORDER — MORPHINE 100MG IN NS 100ML (1MG/ML) PREMIX INFUSION
5.0000 mg/h | INTRAVENOUS | Status: DC
  Administered 2020-05-16: 5 mg/h via INTRAVENOUS
  Filled 2020-05-16: qty 100

## 2020-05-16 MED ORDER — ONDANSETRON HCL 4 MG/2ML IJ SOLN: 4.0000 mg | Freq: Four times a day (QID) | INTRAMUSCULAR | Status: DC | PRN

## 2020-05-16 MED ORDER — POLYVINYL ALCOHOL 1.4 % OP SOLN: 1.0000 [drp] | Freq: Four times a day (QID) | OPHTHALMIC | Status: DC | PRN

## 2020-05-16 MED ORDER — HALOPERIDOL 0.5 MG PO TABS
0.5000 mg | ORAL_TABLET | ORAL | Status: DC | PRN
  Filled 2020-05-16: qty 1

## 2020-05-16 MED ORDER — GLYCOPYRROLATE 1 MG PO TABS
1.0000 mg | ORAL_TABLET | ORAL | Status: DC | PRN
  Filled 2020-05-16: qty 1

## 2020-05-24 NOTE — Progress Notes (Addendum)
ANTICOAGULATION CONSULT NOTE - Follow Up Consult  Pharmacy Consult for IV Heparin Indication: atrial fibrillation  No Known Allergies  Patient Measurements: Height: 5\' 10"  (177.8 cm) Weight: 80.3 kg (177 lb 0.5 oz) IBW/kg (Calculated) : 73 Heparin Dosing Weight: 76 kg  Vital Signs: BP: 78/47 (09/23 0722) Pulse Rate: 49 (09/23 0722)  Labs: Recent Labs    05/13/20 1545 05/14/20 0420 05/14/20 1239 05/14/20 1239 05/15/20 0416 2020/06/03 0310  HGB   < >  --  12.8*   < > 12.3* 11.8*  HCT   < >  --  40.4  --  37.6* 36.4*  PLT   < >  --  249  --  254 277  HEPARINUNFRC  --  0.31  --   --  0.28* 0.36  CREATININE  --  2.43*  --   --  2.34* 2.58*   < > = values in this interval not displayed.    Estimated Creatinine Clearance: 21.2 mL/min (A) (by C-G formula based on SCr of 2.58 mg/dL (H)).   Medical History: Past Medical History:  Diagnosis Date  . CAD, NATIVE VESSEL    a. stenting of the RCA in 06/2005 with a bare-metal stent with residual 50-70% narrowing in the left anterior descending coronary artery. b. nonischemic nuc 2007.  . Diverticulosis   . Hemorrhoids   . History of colon polyps   . HYPERLIPIDEMIA-MIXED   . HYPERTENSION, BENIGN   . Lumbar stenosis   . Moderate aortic stenosis   . PROSTATE CANCER, HX OF   . Skin cancer    face, neck  . Tubular adenoma   . Unspecified atrial fibrillation ([place]) 05/13/2020    Assessment: 86 yr old male with new development of afib.  Pharmacy was asked to begin anticoagulation with IV heparin.  Pt has been receiving Lovenox for DVT prophylaxis, last dose 9/19. Last CBC prior to starting IV heparin (9/17) was WNL; no known recent bleeding complications.  Heparin level therapeutic after increasing drip rate to 1050 units/hr yesterday. CBC stable. No overt bleeding or infusion issues per discussion with nursing  Goal of Therapy:  Heparin level 0.3-0.7 units/ml Monitor platelets by anticoagulation protocol: Yes   Plan:  Continue  heparin infusion at 1050 units/hr Monitor daily HL, CBC, s/sx bleeding  =====================  ADDENDUM: Discussed with Dr. Aundra Dubin, stopping heparin infusion at this time.  Richardine Service, PharmD PGY2 Cardiology Pharmacy Resident Phone: [phone] 06-03-2020  7:52 AM  Please check [email or web address] for unit-specific pharmacy phone numbers.

## 2020-05-24 NOTE — Progress Notes (Signed)
Spoke with [name], NP with palliative. To increase morphine drip to keep patient comfortable.  Awaiting sister and daughter to come say goodbyes.

## 2020-05-24 NOTE — Progress Notes (Signed)
Palliative:   [name] [name] is now comfort care, he is actively dying.  His cousin's husband, his golf partner is at bedside.  [name] [name] is able to make his needs known and tells me that his is not having pain.  His RR is high.  He is still on vasopressors and has a continuous infusion of morphine.    His daughter [name] [name] tells staff that she is coming and is expected to arrive around 1200-1300.  Comfort care orders are in place and nursing staff is assisting with balancing comfort and time with family.  Anticipate time is short and death will happen quickly once vasopressors are stopped.    Detailed conference with attending, bedside nursing staff, [facility] team related to patient condition, needs, comfort care.    Plan:  Comfort care. Wean vasopressors to off.  Morphine infusion for increased RR, EOL care.  Wean oxygen as tolerated.   Prognosis: Hours to days.   2 minutes  [name], NP Palliative Medicine Team Team Phone [phone] Greater than 50% of this time was spent counseling and coordinating care related to the above assessment and plan.

## 2020-05-24 NOTE — Progress Notes (Signed)
Text paged Dr Wyline Copas regarding patients plan of care for the day.  Currently tachypneic and moaning. PO morphine given for pain. Palliative care to see him first thing this morning.

## 2020-05-24 NOTE — Progress Notes (Signed)
Dr Aundra Dubin rounding. To start morphine drip.  Awaiting palliative care to round.

## 2020-05-24 NOTE — Discharge Summary (Signed)
Death Summary  [name] [name] RKY:[id number] DOB: 17-Dec-1931 DOA: May 14, 2020  PCP: Burnard Bunting, MD  Admit date: 05-14-20 Date of Death: 05-24-20 Time of Death: 1231-11-22 Notification: Burnard Bunting, MD notified of death of 05-24-20   History of present illness:  Pt was an 86yo male with a hx of CAD, aortic stenosis who presented with SOB, hypotension, found to have an LVEF of <20% with severe AS. The patient was transferred to [facility] to see the AHF team and was continued on milrinone  Final Diagnoses:  Acute systolic CHF with cardiogenic shock --Patient was continued on milrinone but despite aggressive measures, patient became more hemodynamically unstable and later became dependent on vasopressor per Cardiology --Given acute renal failure and frailty, patient was not considered a candidate for TAVR --Prognosis was very poor at this point --Towards the end of patient's stay, he became more hypotensive with increasing O2 requirements --Follow up chest xray confirmed worsening volume overload and pulmonary edema --With progressive worsening of cardiogenic shock, patient's condition became terminal at that point --Palliative Care was consulted and on discussion with patient and family, decision was made for transition to full comfort care --Patient was started on morphine for comfort and pressors weaned off. Patient was later pronounced at 1233  Unspecified atrial fibrillation [facility]) --newly diagnosed this visit --management per cardiology --patient became hypotensive on amiodarone --Remained rate controlled  Atrial fibrillation --This was managed bycardiology; became hypotensive w/ amiodarone --Patient was noted to be inSR w/episodesof NSVT, currently rate controlled  Acquired thrombophilia --Secondary to hx of afib  Aortic stenosis, severe --Was followed by cardiology, TAVR consideredbut not stable enough to pursue --See above  AKI (acute kidney injury) ([place]) --vs  CKD, no previous labsbut concern for cardiorenal syndrome. --Renal function gradually worsened during this course  COPD (chronic obstructive pulmonary disease) ([place]) --Patient was continued onnebs and Dulera --no audible wheezing on exam  Elevated troponin -Likely secondary to acute CHF. Management per cardiology  Grief reaction --Likely secondary to death of wife recently in [place] --Patient was continued onhydroxyzine nightly as needed for insomnia as per psychiatry  Elevated LFTs --secondary to CHFcongestive hepatopathy --transaminaseseventually normalized --monitor  Oral candidiasis --resolved,can stopNystatin9/24  DNR -See above, very poor prognosis  Palliative Care Encounter -Discussed with patient and family. Very poor prognosis with multiorgan failure and terminal condition -Appreciate assistance by Palliative Care service. After family meeting, decision was made to transition to full comfort care -Patient was later pronounced at 1233 on 05/25/23  The results of significant diagnostics from this hospitalization (including imaging, microbiology, ancillary and laboratory) are listed below for reference.    Significant Diagnostic Studies: DG Chest 2 View  Result Date: 2020-05-14 CLINICAL DATA:  Shortness of breath, weakness and cough. EXAM: CHEST - 2 VIEW COMPARISON:  None. FINDINGS: The heart is borderline enlarged. There is tortuosity and calcification of the thoracic aorta. No acute pulmonary findings. No infiltrates, edema or effusions. The bony thorax is intact. IMPRESSION: No acute cardiopulmonary findings. Electronically Signed   By: Marijo Sanes M.D.   On: May 14, 2020 12:36   US RENAL  Result Date: 05/08/2020 CLINICAL DATA:  Acute renal insufficiency EXAM: RENAL / URINARY TRACT ULTRASOUND COMPLETE COMPARISON:  None. FINDINGS: Right Kidney: Renal measurements: 9.7 x 5.0 x 5.1 cm = volume: 128 mL. Echogenicity within normal limits. No mass or hydronephrosis  visualized. A 3.6 cm simple cyst is seen within the lower pole. Left Kidney: Renal measurements: 10.8 x 4.6 x 3.8 cm = volume: 100 mL. Echogenicity within normal  limits. No mass or hydronephrosis visualized. Bladder: Appears normal for degree of bladder distention. Other: A small left pleural effusion is incidentally noted IMPRESSION: Normal renal sonogram.  No hydronephrosis. Small left pleural effusion Electronically Signed   By: Fidela Salisbury MD   On: 05/08/2020 18:52   DG CHEST PORT 1 VIEW  Result Date: 05/15/2020 CLINICAL DATA:  Hypoxia. EXAM: PORTABLE CHEST 1 VIEW COMPARISON:  Chest x-ray dated May 13, 2020. FINDINGS: Unchanged right upper extremity PICC line. Stable cardiomegaly. Progressive perihilar predominant opacities. New small bilateral pleural effusions. No pneumothorax. No acute osseous abnormality. IMPRESSION: 1. Worsening pulmonary edema and new small bilateral pleural effusions. Electronically Signed   By: Titus Dubin M.D.   On: 05/15/2020 17:33   DG CHEST PORT 1 VIEW  Result Date: 05/13/2020 CLINICAL DATA:  Cough and in of the sulci, acute on chronic systolic heart failure EXAM: PORTABLE CHEST 1 VIEW COMPARISON:  05/08/2020 FINDINGS: RIGHT-sided PICC line in place terminates in the distal superior vena cava. Heart size remains enlarged accentuated by portable technique. Background interstitial prominence in signs of developing alveolar opacity in the bilateral chest, increased since the previous study. Increasing density particularly at the RIGHT lung base. On limited assessment skeletal structures without acute process. IMPRESSION: 1. Interval worsening of bilateral interstitial and airspace process, findings could be seen in the setting of asymmetric edema or atypical infection. 2. RIGHT PICC line unchanged. 3. Underlying cardiomegaly. Electronically Signed   By: Zetta Bills M.D.   On: 05/13/2020 07:53   DG CHEST PORT 1 VIEW  Result Date: 05/08/2020 CLINICAL DATA:   Shortness of breath. EXAM: PORTABLE CHEST 1 VIEW COMPARISON:  May 06, 2020. FINDINGS: Stable cardiomegaly. No pneumothorax or pleural effusion is noted. Left lung is clear. Right-sided PICC line is noted with distal tip in expected position and of SVC. Right perihilar infiltrate or atelectasis is noted. Bony thorax is unremarkable. IMPRESSION: Right-sided PICC line in grossly good position. Right perihilar infiltrate or atelectasis is noted. Aortic Atherosclerosis (ICD10-I70.0). Electronically Signed   By: Marijo Conception M.D.   On: 05/08/2020 14:56   ECHOCARDIOGRAM COMPLETE  Result Date: 05/07/2020    ECHOCARDIOGRAM REPORT   Patient Name:   HITOSHI WERTS Date of Exam: 05/07/2020 Medical Rec #:  [id number]      Height:       70.0 in Accession #:    [id number]     Weight:       170.4 lb Date of Birth:  [birth year]       BSA:          1.950 m Patient Age:    20 years       BP:           102/79 mmHg Patient Gender: M              HR:           99 bpm. Exam Location:  Inpatient Procedure: 2D Echo and Intracardiac Opacification Agent Indications:    Aortic Stenosis 424.1 / 135.0  History:        Patient has prior history of Echocardiogram examinations, most                 recent 05/25/2018. CAD; Risk Factors:Hypertension and                 Dyslipidemia. Wife recently passed away 2 weeks ago.  Sonographer:    Darlina Sicilian RDCS Referring Phys: [id number] TOCHUKWU AGBATA IMPRESSIONS  1.  Left ventricular ejection fraction, by estimation, is <20%. The left ventricle has severely decreased function. The left ventricle demonstrates global hypokinesis. There is mild left ventricular hypertrophy of the basal-septal segment. Left ventricular diastolic parameters are indeterminate. The average left ventricular global longitudinal strain is -2.8 %. The global longitudinal strain is abnormal.  2. Right ventricular systolic function is normal. The right ventricular size is normal. There is severely elevated pulmonary artery  systolic pressure. The estimated right ventricular systolic pressure is 47.4 mmHg.  3. The mitral valve is normal in structure. Mild to moderate mitral valve regurgitation. No evidence of mitral stenosis.  4. The aortic valve is tricuspid. There is severe calcifcation of the aortic valve. There is severe thickening of the aortic valve. Aortic valve regurgitation is mild. No aortic stenosis is present. Aortic valve area, by VTI measures 0.44 cm. Aortic valve mean gradient measures 13.5 mmHg. Aortic valve Vmax measures 2.53 m/s. Dimensionless index is 0.15. The transaortic gradients are underestimated due to severe LV dysfunction. This is consistent with severe low flow, low gradient aortic stenosis.  5. The inferior vena cava is normal in size with <50% respiratory variability, suggesting right atrial pressure of 8 mmHg. FINDINGS  Left Ventricle: Left ventricular ejection fraction, by estimation, is <20%. The left ventricle has severely decreased function. The left ventricle demonstrates global hypokinesis. Definity contrast agent was given IV to delineate the left ventricular endocardial borders. The average left ventricular global longitudinal strain is -2.8 %. The global longitudinal strain is abnormal. The left ventricular internal cavity size was normal in size. There is mild left ventricular hypertrophy of the basal-septal segment. Left ventricular diastolic parameters are indeterminate. Right Ventricle: The right ventricular size is normal. No increase in right ventricular wall thickness. Right ventricular systolic function is normal. There is severely elevated pulmonary artery systolic pressure. The tricuspid regurgitant velocity is 3.86 m/s, and with an assumed right atrial pressure of 8 mmHg, the estimated right ventricular systolic pressure is 25.9 mmHg. Left Atrium: Left atrial size was normal in size. Right Atrium: Right atrial size was normal in size. Pericardium: There is no evidence of pericardial  effusion. Mitral Valve: The mitral valve is normal in structure. There is mild calcification of the mitral valve leaflet(s). Mild to moderate mitral annular calcification. Mild to moderate mitral valve regurgitation. No evidence of mitral valve stenosis. Tricuspid Valve: The tricuspid valve is normal in structure. Tricuspid valve regurgitation is mild . No evidence of tricuspid stenosis. Aortic Valve: The aortic valve is tricuspid. There is severe calcifcation of the aortic valve. There is severe thickening of the aortic valve. There is severe aortic valve annular calcification. Aortic valve regurgitation is mild. No aortic stenosis is present. Aortic valve mean gradient measures 13.5 mmHg. Aortic valve peak gradient measures 25.6 mmHg. Aortic valve area, by VTI measures 0.44 cm. Pulmonic Valve: The pulmonic valve was normal in structure. Pulmonic valve regurgitation is mild. No evidence of pulmonic stenosis. Aorta: The aortic root is normal in size and structure. Venous: The inferior vena cava is normal in size with less than 50% respiratory variability, suggesting right atrial pressure of 8 mmHg. IAS/Shunts: The interatrial septum appears to be lipomatous. No atrial level shunt detected by color flow Doppler.  LEFT VENTRICLE PLAX 2D LVIDd:         4.70 cm LVIDs:         4.00 cm  2D Longitudinal Strain LV PW:         1.00 cm  2D Strain GLS (  A2C):   -2.3 % LV IVS:        1.30 cm  2D Strain GLS (A3C):   -2.2 % LVOT diam:     1.90 cm  2D Strain GLS (A4C):   -4.0 % LV SV:         19       2D Strain GLS Avg:     -2.8 % LV SV Index:   10 LVOT Area:     2.84 cm                          3D Volume EF:                         3D EF:        19 %                         LV EDV:       224 ml                         LV ESV:       182 ml                         LV SV:        42 ml LEFT ATRIUM             Index       RIGHT ATRIUM           Index LA diam:        4.50 cm 2.31 cm/m  RA Area:     12.90 cm LA Vol (A2C):   48.7 ml  24.97 ml/m RA Volume:   31.40 ml  16.10 ml/m LA Vol (A4C):   57.4 ml 29.44 ml/m LA Biplane Vol: 54.3 ml 27.85 ml/m  AORTIC VALVE AV Area (Vmax):    0.44 cm AV Area (Vmean):   0.41 cm AV Area (VTI):     0.44 cm AV Vmax:           253.00 cm/s AV Vmean:          167.500 cm/s AV VTI:            0.444 m AV Peak Grad:      25.6 mmHg AV Mean Grad:      13.5 mmHg LVOT Vmax:         39.20 cm/s LVOT Vmean:        24.400 cm/s LVOT VTI:          0.069 m LVOT/AV VTI ratio: 0.15  AORTA Ao Asc diam: 3.40 cm TRICUSPID VALVE TR Peak grad:   59.6 mmHg TR Vmax:        386.00 cm/s  SHUNTS Systemic VTI:  0.07 m Systemic Diam: 1.90 cm Fransico Him MD Electronically signed by Fransico Him MD Signature Date/Time: 05/07/2020/3:33:37 PM    Final    [place] EKG SITE RITE  Result Date: 05/08/2020 If Site Rite image not attached, placement could not be confirmed due to current cardiac rhythm.   Microbiology: Recent Results (from the past 240 hour(s))  MRSA PCR Screening     Status: None   Collection Time: 05/08/20 11:36 PM   Specimen: Nasopharyngeal  Result Value Ref Range Status   MRSA by PCR NEGATIVE NEGATIVE Final    Comment:  The GeneXpert MRSA Assay (FDA approved for NASAL specimens only), is one component of a comprehensive MRSA colonization surveillance program. It is not intended to diagnose MRSA infection nor to guide or monitor treatment for MRSA infections. Performed at [facility], [place] [address] [place], [place] [address]      Labs: Basic Metabolic Panel: Recent Labs  Lab 05/10/20 0305 05/11/20 0421 05/11/20 1447 05/12/20 0443 05/12/20 0443 05/13/20 0407 05/13/20 0407 05/14/20 0420 05/14/20 0420 05/15/20 0416 2020/05/26 0310  NA 138   < >  --  131*  --  131*  --  130*  --  128* 126*  K 3.9   < >  --  4.4   < > 4.4   < > 4.4   < > 4.5 4.8  CL 103   < >  --  98  --  97*  --  97*  --  96* 94*  CO2 23   < >  --  24  --  21*  --  23  --  19* 20*  GLUCOSE 125*   < >  --  102*   --  99  --  109*  --  148* 121*  BUN 100*   < >  --  74*  --  73*  --  87*  --  95* 115*  CREATININE 2.57*   < >  --  1.89*  --  2.15*  --  2.43*  --  2.34* 2.58*  CALCIUM 8.4*   < >  --  8.0*  --  8.0*  --  7.7*  --  7.8* 7.9*  MG 2.5*  --  2.4 2.4  --   --   --   --   --   --   --    < > = values in this interval not displayed.   Liver Function Tests: Recent Labs  Lab 05/10/20 0305 05/12/20 0443  AST 38 37  ALT 44 34  ALKPHOS 148* 98  BILITOT 2.1* 2.1*  PROT 5.7* 5.3*  ALBUMIN 2.7* 2.3*   No results for input(s): LIPASE, AMYLASE in the last 168 hours. No results for input(s): AMMONIA in the last 168 hours. CBC: Recent Labs  Lab 05/10/20 0305 05/10/20 0305 05/13/20 0920 05/13/20 1545 05/14/20 1239 05/15/20 0416 2020-05-26 0310  WBC 10.2   < > 11.8* 11.4* 13.0* 13.2* 15.3*  NEUTROABS 8.5*  --   --   --   --   --   --   HGB 15.6   < > 12.9* 12.7* 12.8* 12.3* 11.8*  HCT 48.0   < > 40.5 40.0 40.4 37.6* 36.4*  MCV 88.2   < > 88.2 87.3 87.3 87.2 87.1  PLT 251   < > 216 219 249 254 277   < > = values in this interval not displayed.   Cardiac Enzymes: No results for input(s): CKTOTAL, CKMB, CKMBINDEX, TROPONINI in the last 168 hours. D-Dimer No results for input(s): DDIMER in the last 72 hours. BNP: Invalid input(s): POCBNP CBG: Recent Labs  Lab 05/13/20 1257  GLUCAP 162*   Anemia work up No results for input(s): VITAMINB12, FOLATE, FERRITIN, TIBC, IRON, RETICCTPCT in the last 72 hours. Urinalysis    Component Value Date/Time   COLORURINE YELLOW 05/02/2020 1102   APPEARANCEUR CLEAR 05/19/2020 1102   LABSPEC 1.016 05/21/2020 1102   PHURINE 5.0 05/15/2020 1102   GLUCOSEU NEGATIVE 05/21/2020 1102   HGBUR NEGATIVE 05/21/2020 [place]  NEGATIVE 05/01/2020 1102   [place] 05/05/2020 1102   PROTEINUR 30 (A) 05/18/2020 1102   NITRITE NEGATIVE 04/25/2020 1102   LEUKOCYTESUR NEGATIVE 05/14/2020 1102   Sepsis Labs Invalid input(s): PROCALCITONIN,   WBC,  LACTICIDVEN   SIGNED:  Marylu Lund, MD  [facility] 2020/05/29, 4:15 PM  If 7PM-7AM, please contact night-coverage [email or web address] Password [email or web address]

## 2020-05-24 NOTE — Progress Notes (Signed)
Patient ID: [name] [name], male   DOB: Jul 14, 1932, 86 y.o.   MRN: [id number]     Advanced Heart Failure Rounding Note  PCP-Cardiologist: Jenkins Rouge, MD   Subjective:    Evaluated by structural heart team yesterday and deemed not a candidate for TAVR.   Continues to deteriorate despite inotrope's, milrinone 0.375 + NE 10 w/ marginal co-ox at 51% and  progressive renal failure. SCr continues to rise, now at 2.5. BUN 115.   Now off milrinone w/ low SBP in the 70s.  CVP 12   Objective:   Weight Range: 80.3 kg Body mass index is 25.4 kg/m.   Vital Signs:   Pulse Rate:  [49-102] 49 (09/23 0722) Resp:  [20-41] 41 (09/23 0722) BP: (70-107)/(41-82) 78/47 (09/23 0722) SpO2:  [77 %-100 %] 89 % (09/23 0722) Weight:  [80.3 kg] 80.3 kg (09/23 0308) Last BM Date: 05/13/20  Weight change: Filed Weights   05/14/20 0447 05/15/20 0500 06-11-2020 0308  Weight: 79.8 kg 77 kg 80.3 kg    Intake/Output:   Intake/Output Summary (Last 24 hours) at 06/11/20 0746 Last data filed at Jun 11, 2020 0700 Gross per 24 hour  Intake 1201.08 ml  Output 400 ml  Net 801.08 ml      Physical Exam   CVP 12 General: elderly male/ frail appearing, fatigue looking. No respiratory distress HEENT: normal Neck: supple. JVP ~10 cm . Carotids 2+ bilat; no bruits. No lymphadenopathy or thryomegaly appreciated. Cor: PMI nondisplaced. Irregularly irregular rhythm and rate. No rubs, gallops. 2/6 AS . Lungs: clear bilaterally  Abdomen: soft, nontender, nondistended. No hepatosplenomegaly. No bruits or masses. Good bowel sounds. Extremities: no cyanosis, clubbing, rash, trace bilateral LE edema + RUE PICC, cool distal extremities Neuro: lethargic   Telemetry   Afib 70s-80s  Labs    CBC Recent Labs    05/15/20 0416 06-11-20 0310  WBC 13.2* 15.3*  HGB 12.3* 11.8*  HCT 37.6* 36.4*  MCV 87.2 87.1  PLT 254 696   Basic Metabolic Panel Recent Labs    05/15/20 0416 06-11-2020 0310  NA 128* 126*  K 4.5  4.8  CL 96* 94*  CO2 19* 20*  GLUCOSE 148* 121*  BUN 95* 115*  CREATININE 2.34* 2.58*  CALCIUM 7.8* 7.9*   Liver Function Tests No results for input(s): AST, ALT, ALKPHOS, BILITOT, PROT, ALBUMIN in the last 72 hours. No results for input(s): LIPASE, AMYLASE in the last 72 hours. Cardiac Enzymes No results for input(s): CKTOTAL, CKMB, CKMBINDEX, TROPONINI in the last 72 hours.  BNP: BNP (last 3 results) Recent Labs    05/14/2020 1101  BNP 3,817.0*    ProBNP (last 3 results) No results for input(s): PROBNP in the last 8760 hours.   D-Dimer No results for input(s): DDIMER in the last 72 hours. Hemoglobin A1C No results for input(s): HGBA1C in the last 72 hours. Fasting Lipid Panel No results for input(s): CHOL, HDL, LDLCALC, TRIG, CHOLHDL, LDLDIRECT in the last 72 hours. Thyroid Function Tests No results for input(s): TSH, T4TOTAL, T3FREE, THYROIDAB in the last 72 hours.  Invalid input(s): FREET3  Other results:   Imaging    DG CHEST PORT 1 VIEW  Result Date: 05/15/2020 CLINICAL DATA:  Hypoxia. EXAM: PORTABLE CHEST 1 VIEW COMPARISON:  Chest x-ray dated May 13, 2020. FINDINGS: Unchanged right upper extremity PICC line. Stable cardiomegaly. Progressive perihilar predominant opacities. New small bilateral pleural effusions. No pneumothorax. No acute osseous abnormality. IMPRESSION: 1. Worsening pulmonary edema and new small bilateral pleural effusions. Electronically  Signed   By: Titus Dubin M.D.   On: 05/15/2020 17:33     Medications:     Scheduled Medications: . aspirin EC  81 mg Oral Daily  . Chlorhexidine Gluconate Cloth  6 each Topical Daily  . ezetimibe  10 mg Oral Daily   And  . simvastatin  20 mg Oral q1800  . midodrine  15 mg Oral TID WC  . mometasone-formoterol  2 puff Inhalation BID  . nystatin  5 mL Oral QID  . omega-3 acid ethyl esters  1 g Oral Daily  . senna-docusate  2 tablet Oral BID  . sodium chloride flush  10-40 mL Intracatheter  Q12H  . sodium chloride flush  3 mL Intravenous Q12H    Infusions: . sodium chloride    . sodium chloride    . heparin 1,050 Units/hr (07-Jun-2020 0700)  . milrinone 0.375 mcg/kg/min (2020/06/07 0700)  . norepinephrine (LEVOPHED) Adult infusion 10 mcg/min (2020-06-07 0700)    PRN Medications: sodium chloride, acetaminophen **OR** acetaminophen, alum & mag hydroxide-simeth, bisacodyl, hydrocortisone, hydrOXYzine, ipratropium-albuterol, morphine CONCENTRATE, nitroGLYCERIN, ondansetron **OR** ondansetron (ZOFRAN) IV, sodium chloride flush, sodium chloride flush, witch hazel-glycerin  Assessment/Plan   1. Acute on chronic systolic CHF: Echo this admission with new EF < 20%, normal RV, suspect low flow/low gradient severe AS. Prior echo in 2019 with severe AS, EF 50-55%. No chest pain, just dyspnea. There is concern that this may be the natural history over the last 2 years of severe AS.  It appears that he was lost to followup after 2019 echo. He has a history of CAD, so cannot rule out a component of ischemic cardiomyopathy.  He did not present with ACS.  - actively dying from end-stage HR and severe AS. Not a candidate for TAVR - move to comfort care - stop milrinone w/ hypotension  - plan to start morphine gtt  2. Aortic stenosis: Low flow/low gradient severe AS.  In 2019, echo showed severe AS. Not a candidate for TAVR 3. Progressive Renal Failure: - worsening renal failure in setting of low output, end stage HF - SCr 2.5. BUN 115  4. CAD: PCI in 2006.  Presentation not consistent with ACS, no chest pain.  HS-TnI mildly elevated with no trend.   - No chest pain. - Continue ASA, statin.  5. Afib - rate controlled on tele  6. [place] - DNR. Palliative Care following. Transitioning to comfort care today. Anticipate hospital death. Plan to start morphine gtt  Length of Stay: [address] PA-C  06-07-20, 7:46 AM  Advanced Heart Failure Team Pager [phone] (M-F; 7a - 4p)  Please  contact [facility] for night-coverage after hours (4p -7a ) and weekends on [email or web address]

## 2020-05-24 NOTE — Progress Notes (Signed)
Notified Lyda Jester, PA that milrinone was turned off this AM for persistent hypotension despite levophed drip.

## 2020-05-24 NOTE — Progress Notes (Signed)
Just spoke with pts estranged daughter Manuela Schwartz. Does not seem like she is going to be making the trip from [place] to see her father.  Sister Opal Sidles at beside.  Discussed plan to keep patient comfortable with morphine and to wean him off the levophed and oxygen.

## 2020-05-24 NOTE — Progress Notes (Signed)
Patient ID: [name] [name], male   DOB: 14-Oct-1931, 86 y.o.   MRN: [id number]     Advanced Heart Failure Rounding Note  PCP-Cardiologist: Jenkins Rouge, MD   Subjective:    Patient worsened significantly through the day yesterday, progressive hypotension and dyspnea. Now off milrinone and on NE 10, he is getting morphine boluses for air hunger. Renal function worsening.   Objective:   Weight Range: 80.3 kg Body mass index is 25.4 kg/m.   Vital Signs:   Pulse Rate:  [49-102] 84 (09/23 0730) Resp:  [20-41] 28 (09/23 0730) BP: (70-107)/(41-82) 74/54 (09/23 0730) SpO2:  [77 %-100 %] 86 % (09/23 0730) Weight:  [80.3 kg] 80.3 kg (09/23 0308) Last BM Date: 05/13/20  Weight change: Filed Weights   05/14/20 0447 05/15/20 0500 June 02, 2020 0308  Weight: 79.8 kg 77 kg 80.3 kg    Intake/Output:   Intake/Output Summary (Last 24 hours) at 06/02/2020 0756 Last data filed at 2020/06/02 0700 Gross per 24 hour  Intake 1201.08 ml  Output 400 ml  Net 801.08 ml      Physical Exam   General: Frail, drowsy Neck: JVP 14 cm, no thyromegaly or thyroid nodule.  Lungs: Crackles at bases CV: Nondisplaced PMI.  Heart irregular S1/S2, no S3/S4, 2/6 SEM RUSB with obscured S2.  1+ ankle edema.   Abdomen: Soft, nontender, no hepatosplenomegaly, no distention.  Skin: Intact without lesions or rashes.  Neurologic: Alert and oriented x 3.  Psych: Normal affect. Extremities: No clubbing or cyanosis.  HEENT: Normal.    Telemetry   Afib 70s-80s  Labs    CBC Recent Labs    05/15/20 0416 06-02-2020 0310  WBC 13.2* 15.3*  HGB 12.3* 11.8*  HCT 37.6* 36.4*  MCV 87.2 87.1  PLT 254 017   Basic Metabolic Panel Recent Labs    05/15/20 0416 06/02/20 0310  NA 128* 126*  K 4.5 4.8  CL 96* 94*  CO2 19* 20*  GLUCOSE 148* 121*  BUN 95* 115*  CREATININE 2.34* 2.58*  CALCIUM 7.8* 7.9*   Liver Function Tests No results for input(s): AST, ALT, ALKPHOS, BILITOT, PROT, ALBUMIN in the last 72  hours. No results for input(s): LIPASE, AMYLASE in the last 72 hours. Cardiac Enzymes No results for input(s): CKTOTAL, CKMB, CKMBINDEX, TROPONINI in the last 72 hours.  BNP: BNP (last 3 results) Recent Labs    05/22/2020 1101  BNP 3,817.0*    ProBNP (last 3 results) No results for input(s): PROBNP in the last 8760 hours.   D-Dimer No results for input(s): DDIMER in the last 72 hours. Hemoglobin A1C No results for input(s): HGBA1C in the last 72 hours. Fasting Lipid Panel No results for input(s): CHOL, HDL, LDLCALC, TRIG, CHOLHDL, LDLDIRECT in the last 72 hours. Thyroid Function Tests No results for input(s): TSH, T4TOTAL, T3FREE, THYROIDAB in the last 72 hours.  Invalid input(s): FREET3  Other results:   Imaging    DG CHEST PORT 1 VIEW  Result Date: 05/15/2020 CLINICAL DATA:  Hypoxia. EXAM: PORTABLE CHEST 1 VIEW COMPARISON:  Chest x-ray dated May 13, 2020. FINDINGS: Unchanged right upper extremity PICC line. Stable cardiomegaly. Progressive perihilar predominant opacities. New small bilateral pleural effusions. No pneumothorax. No acute osseous abnormality. IMPRESSION: 1. Worsening pulmonary edema and new small bilateral pleural effusions. Electronically Signed   By: Titus Dubin M.D.   On: 05/15/2020 17:33     Medications:     Scheduled Medications: . aspirin EC  81 mg Oral Daily  . Chlorhexidine  Gluconate Cloth  6 each Topical Daily  . ezetimibe  10 mg Oral Daily   And  . simvastatin  20 mg Oral q1800  . midodrine  15 mg Oral TID WC  . mometasone-formoterol  2 puff Inhalation BID  . nystatin  5 mL Oral QID  . omega-3 acid ethyl esters  1 g Oral Daily  . senna-docusate  2 tablet Oral BID  . sodium chloride flush  10-40 mL Intracatheter Q12H  . sodium chloride flush  3 mL Intravenous Q12H    Infusions: . sodium chloride    . sodium chloride    . heparin 1,050 Units/hr (May 30, 2020 0700)  . milrinone 0.375 mcg/kg/min (May 30, 2020 0700)  . norepinephrine  (LEVOPHED) Adult infusion 10 mcg/min (30-May-2020 0700)    PRN Medications: sodium chloride, acetaminophen **OR** acetaminophen, alum & mag hydroxide-simeth, bisacodyl, hydrocortisone, hydrOXYzine, ipratropium-albuterol, morphine CONCENTRATE, nitroGLYCERIN, ondansetron **OR** ondansetron (ZOFRAN) IV, sodium chloride flush, sodium chloride flush, witch hazel-glycerin  Assessment/Plan   1. Acute on chronic systolic CHF => cardiogenic shock: Echo this admission with new EF < 20%, normal RV, suspect low flow/low gradient severe AS. Prior echo in 2019 with severe AS, EF 50-55%. No chest pain, just dyspnea. There is concern that this may be the natural history over the last 2 years of severe AS.  It appears that he was lost to followup after 2019 echo. He has a history of CAD, so cannot rule out a component of ischemic cardiomyopathy.  He did not present with ACS. End stage CHF due to long-standing severe AS, now low flow/low gradient AS.  He is hypotensive despite escalating NE and milrinone.  At this point, no further options.  Family and patient are amenable to comfort care.  - Stop milrinone, continue NE until all family has seen him, then can stop it.  - Morphine gtt.  2. Aortic stenosis: Low flow/low gradient severe AS.  In 2019, echo showed severe AS.  As above, we are concerned that EF is low primarily due to untreated AS. Not TAVR candidate.  3. AKI: Progressive renal dysfunction despite dual pressors.  4. CAD: PCI in 2006.  Presentation not consistent with ACS, no chest pain.  HS-TnI mildly elevated with no trend.   5. Atrial fibrillation: Now persistent but rate-controlled.  - D/c heparin gtt with comfort care.   Comfort care.  Stop po meds.  Stop NE when family has visited.  Morphine gtt.  Palliative care service to see.   Length of Stay: [place], [place]  30-May-2020, 7:56 AM  Advanced Heart Failure Team Pager [phone] (M-F; [place])  Please contact [facility] for night-coverage  after hours (4p -7a ) and weekends on [email or web address]

## 2020-05-24 NOTE — Plan of Care (Signed)
  Problem: Education: Goal: Knowledge of General Education information will improve Description: Including pain rating scale, medication(s)/side effects and non-pharmacologic comfort measures Outcome: Progressing   Problem: Clinical Measurements: Goal: Will remain free from infection Outcome: Progressing   Problem: Coping: Goal: Level of anxiety will decrease Outcome: Progressing   Problem: Elimination: Goal: Will not experience complications related to bowel motility Outcome: Progressing   Problem: Pain Managment: Goal: General experience of comfort will improve Outcome: Progressing   Problem: Safety: Goal: Ability to remain free from injury will improve Outcome: Progressing

## 2020-05-24 NOTE — Progress Notes (Signed)
Turn off heparin drip per Dr Aundra Dubin. Hold all PO medications.

## 2020-05-24 DEATH — deceased

## 2021-05-11 IMAGING — DX DG CHEST 1V PORT
1 series · 1 of 1 positions shown · non-contrast
Comparison: May 06, 2020.

CLINICAL DATA: Shortness of breath.

EXAM:
PORTABLE CHEST 1 VIEW

[chest ap]
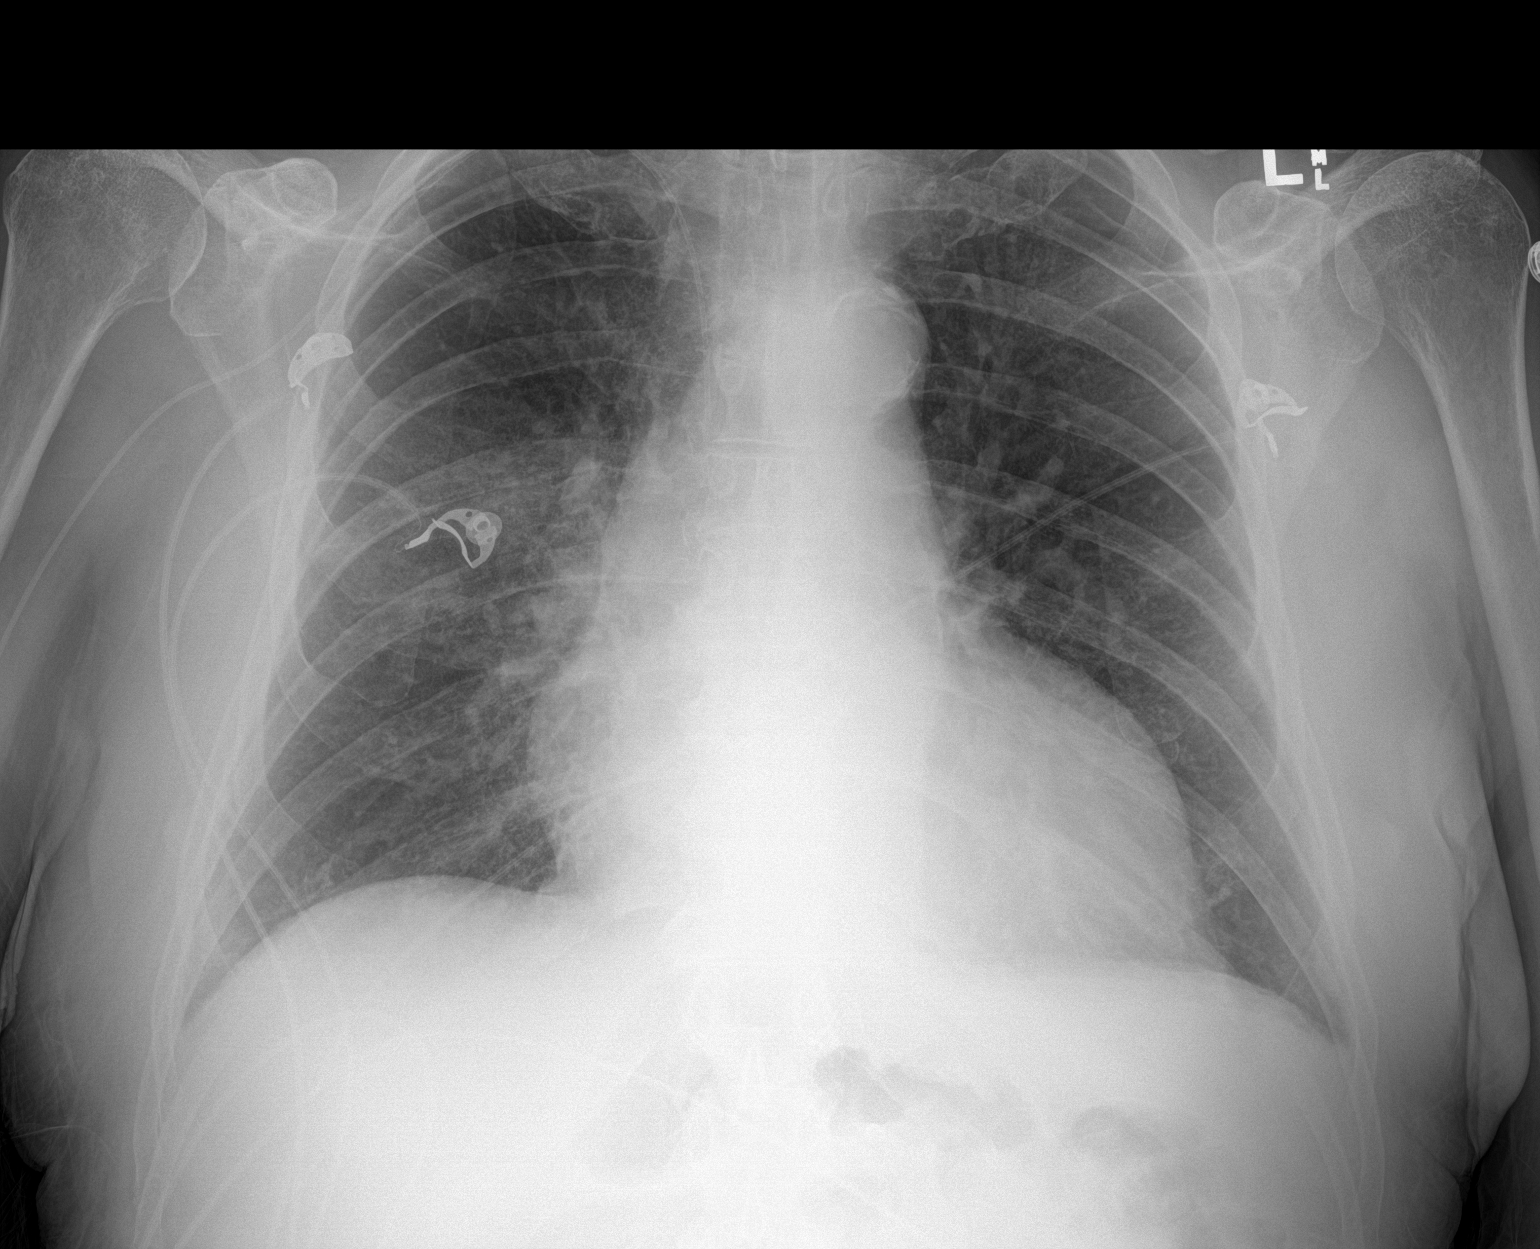

[1 of 1 positions shown; findings below may reference images not displayed]

FINDINGS: Stable cardiomegaly. No pneumothorax or pleural effusion is noted.
Left lung is clear. Right-sided PICC line is noted with distal tip
in expected position and of SVC. Right perihilar infiltrate or
atelectasis is noted. Bony thorax is unremarkable.
IMPRESSION: Right-sided PICC line in grossly good position. Right perihilar
infiltrate or atelectasis is noted.

Aortic Atherosclerosis ([id number]).

## 2021-05-18 IMAGING — DX DG CHEST 1V PORT
1 series · 1 of 1 positions shown · non-contrast
Comparison: Chest x-ray dated May 13, 2020.

CLINICAL DATA: Hypoxia.

EXAM:
PORTABLE CHEST 1 VIEW

[chest ap]
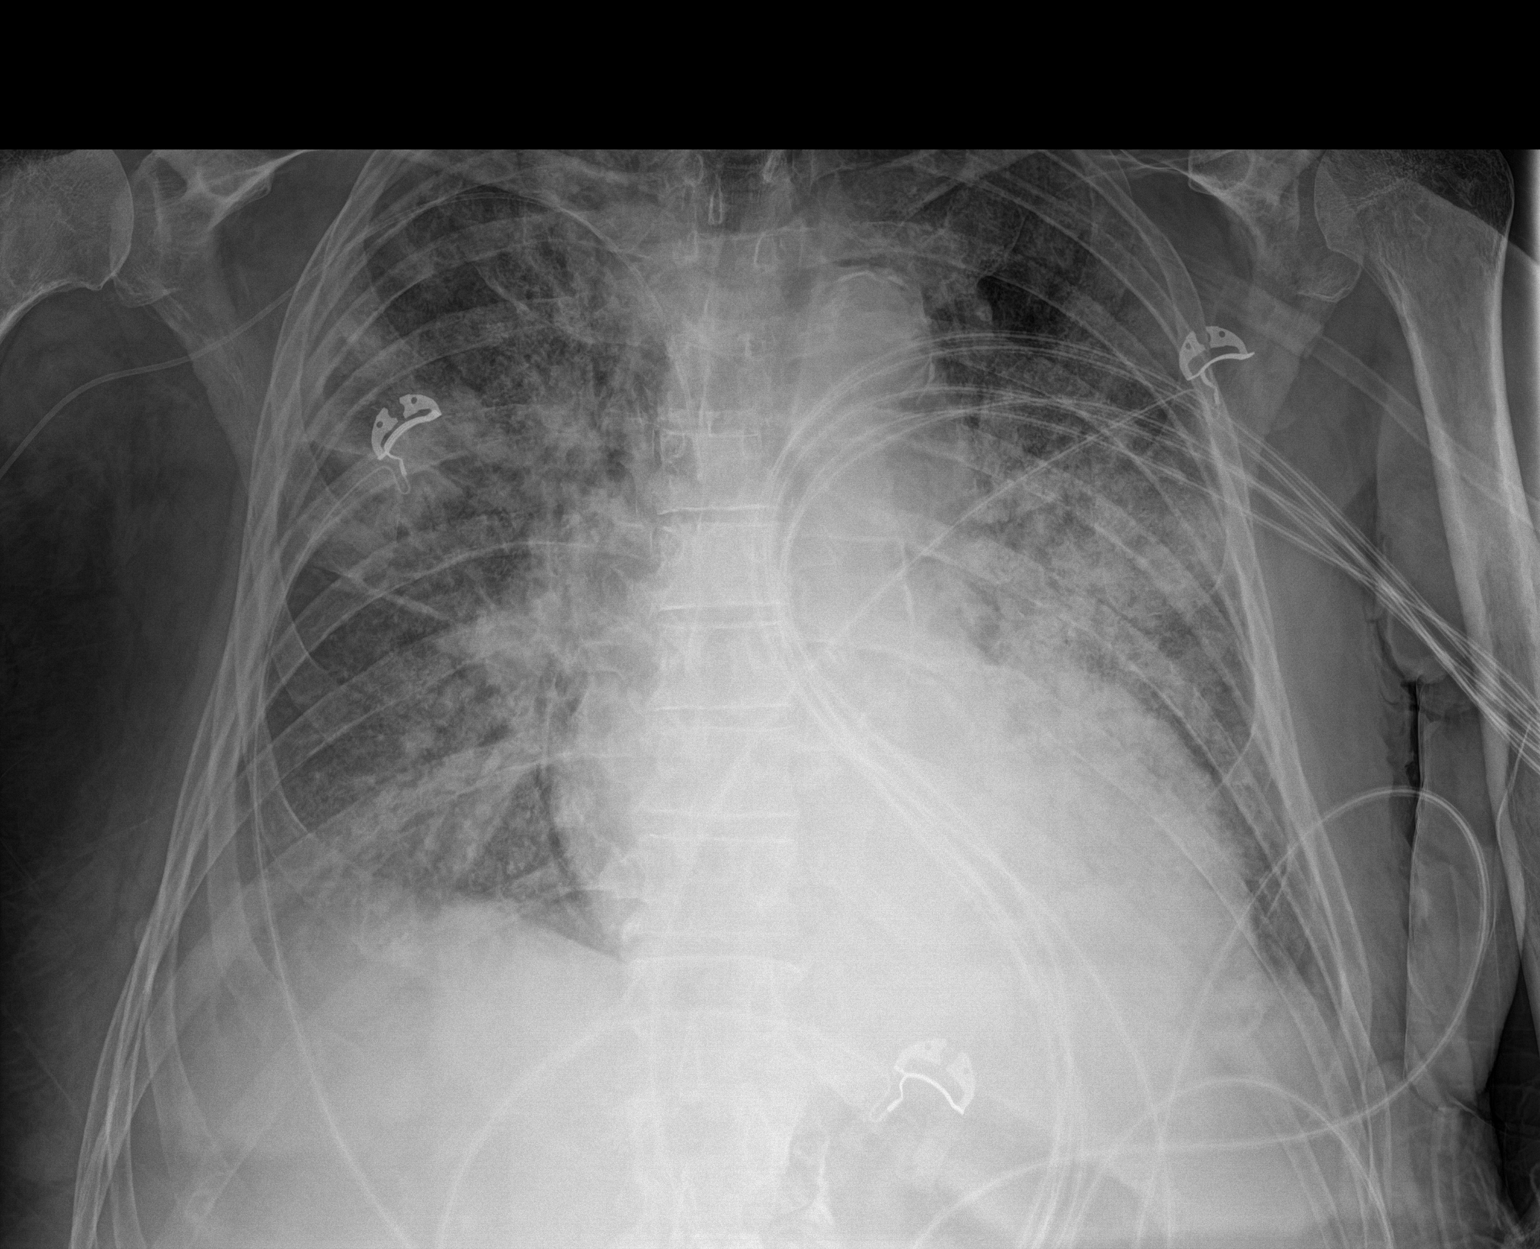

[1 of 1 positions shown; findings below may reference images not displayed]

FINDINGS: Unchanged right upper extremity PICC line. Stable cardiomegaly.
Progressive perihilar predominant opacities. New small bilateral
pleural effusions. No pneumothorax. No acute osseous abnormality.
IMPRESSION: 1. Worsening pulmonary edema and new small bilateral pleural
effusions.
